# Patient Record
Sex: Female | Born: 1985 | Race: White | Hispanic: No | Marital: Single | Smoking: Former smoker
Health system: Southern US, Community
[De-identification: ages and names within clinical notes are randomized; demographics above are authoritative.]

## PROBLEM LIST (undated history)

## (undated) DIAGNOSIS — R319 Hematuria, unspecified: Secondary | ICD-10-CM

## (undated) DIAGNOSIS — R1032 Left lower quadrant pain: Secondary | ICD-10-CM

## (undated) DIAGNOSIS — R51 Headache: Secondary | ICD-10-CM

## (undated) DIAGNOSIS — K219 Gastro-esophageal reflux disease without esophagitis: Secondary | ICD-10-CM

## (undated) DIAGNOSIS — G8929 Other chronic pain: Secondary | ICD-10-CM

## (undated) DIAGNOSIS — R519 Headache, unspecified: Secondary | ICD-10-CM

## (undated) DIAGNOSIS — N898 Other specified noninflammatory disorders of vagina: Secondary | ICD-10-CM

## (undated) DIAGNOSIS — R300 Dysuria: Principal | ICD-10-CM

## (undated) DIAGNOSIS — M79606 Pain in leg, unspecified: Secondary | ICD-10-CM

## (undated) DIAGNOSIS — F1290 Cannabis use, unspecified, uncomplicated: Secondary | ICD-10-CM

## (undated) HISTORY — DX: Other specified noninflammatory disorders of vagina: N89.8

## (undated) HISTORY — DX: Dysuria: R30.0

## (undated) HISTORY — PX: FACIAL COSMETIC SURGERY: SHX629

## (undated) HISTORY — DX: Hematuria, unspecified: R31.9

## (undated) HISTORY — DX: Left lower quadrant pain: R10.32

---
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
## 2006-01-18 ENCOUNTER — Emergency Department (HOSPITAL_COMMUNITY): Admission: EM | Admit: 2006-01-18 | Discharge: 2006-01-18 | Payer: Self-pay | Admitting: Emergency Medicine

## 2006-03-18 ENCOUNTER — Emergency Department (HOSPITAL_COMMUNITY): Admission: EM | Admit: 2006-03-18 | Discharge: 2006-03-18 | Payer: Self-pay | Admitting: Emergency Medicine

## 2006-03-24 ENCOUNTER — Ambulatory Visit (HOSPITAL_COMMUNITY): Admission: RE | Admit: 2006-03-24 | Discharge: 2006-03-24 | Payer: Self-pay | Admitting: Otolaryngology

## 2006-04-11 ENCOUNTER — Emergency Department (HOSPITAL_COMMUNITY): Admission: EM | Admit: 2006-04-11 | Discharge: 2006-04-12 | Payer: Self-pay | Admitting: Emergency Medicine

## 2006-05-10 ENCOUNTER — Emergency Department (HOSPITAL_COMMUNITY): Admission: EM | Admit: 2006-05-10 | Discharge: 2006-05-10 | Payer: Self-pay | Admitting: Emergency Medicine

## 2006-05-20 ENCOUNTER — Emergency Department (HOSPITAL_COMMUNITY): Admission: EM | Admit: 2006-05-20 | Discharge: 2006-05-20 | Payer: Self-pay | Admitting: Emergency Medicine

## 2006-11-21 ENCOUNTER — Other Ambulatory Visit: Admission: RE | Admit: 2006-11-21 | Discharge: 2006-11-21 | Payer: Self-pay | Admitting: Obstetrics and Gynecology

## 2007-01-15 ENCOUNTER — Emergency Department (HOSPITAL_COMMUNITY): Admission: EM | Admit: 2007-01-15 | Discharge: 2007-01-15 | Payer: Self-pay | Admitting: Emergency Medicine

## 2007-01-27 ENCOUNTER — Inpatient Hospital Stay (HOSPITAL_COMMUNITY): Admission: AD | Admit: 2007-01-27 | Discharge: 2007-01-29 | Payer: Self-pay | Admitting: Obstetrics and Gynecology

## 2007-01-27 ENCOUNTER — Ambulatory Visit: Payer: Self-pay | Admitting: Obstetrics and Gynecology

## 2007-01-27 ENCOUNTER — Other Ambulatory Visit: Payer: Self-pay | Admitting: Emergency Medicine

## 2007-01-28 ENCOUNTER — Encounter: Payer: Self-pay | Admitting: Obstetrics and Gynecology

## 2007-04-10 ENCOUNTER — Emergency Department (HOSPITAL_COMMUNITY): Admission: EM | Admit: 2007-04-10 | Discharge: 2007-04-11 | Payer: Self-pay | Admitting: Emergency Medicine

## 2007-07-22 ENCOUNTER — Emergency Department (HOSPITAL_COMMUNITY): Admission: EM | Admit: 2007-07-22 | Discharge: 2007-07-22 | Payer: Self-pay | Admitting: Emergency Medicine

## 2008-03-07 ENCOUNTER — Emergency Department (HOSPITAL_COMMUNITY): Admission: EM | Admit: 2008-03-07 | Discharge: 2008-03-07 | Payer: Self-pay | Admitting: Emergency Medicine

## 2009-03-02 ENCOUNTER — Emergency Department (HOSPITAL_COMMUNITY): Admission: EM | Admit: 2009-03-02 | Discharge: 2009-03-02 | Payer: Self-pay | Admitting: Emergency Medicine

## 2009-05-19 ENCOUNTER — Emergency Department (HOSPITAL_COMMUNITY): Admission: EM | Admit: 2009-05-19 | Discharge: 2009-05-19 | Payer: Self-pay | Admitting: Emergency Medicine

## 2009-11-16 ENCOUNTER — Ambulatory Visit: Payer: Self-pay | Admitting: Obstetrics & Gynecology

## 2009-11-16 ENCOUNTER — Inpatient Hospital Stay (HOSPITAL_COMMUNITY): Admission: AD | Admit: 2009-11-16 | Discharge: 2009-11-17 | Payer: Self-pay | Admitting: Obstetrics & Gynecology

## 2009-11-27 ENCOUNTER — Inpatient Hospital Stay (HOSPITAL_COMMUNITY): Admission: AD | Admit: 2009-11-27 | Discharge: 2009-11-29 | Payer: Self-pay | Admitting: Obstetrics and Gynecology

## 2009-11-27 ENCOUNTER — Ambulatory Visit: Payer: Self-pay | Admitting: Obstetrics & Gynecology

## 2010-03-16 LAB — CBC
HCT: 23.6 % — ABNORMAL LOW (ref 36.0–46.0)
HCT: 31.4 % — ABNORMAL LOW (ref 36.0–46.0)
Hemoglobin: 10.9 g/dL — ABNORMAL LOW (ref 12.0–15.0)
Hemoglobin: 8.2 g/dL — ABNORMAL LOW (ref 12.0–15.0)
MCH: 33 pg (ref 26.0–34.0)
MCH: 33 pg (ref 26.0–34.0)
MCHC: 34.7 g/dL (ref 30.0–36.0)
MCHC: 34.7 g/dL (ref 30.0–36.0)
MCV: 95.2 fL (ref 78.0–100.0)
MCV: 95.3 fL (ref 78.0–100.0)
Platelets: 130 10*3/uL — ABNORMAL LOW (ref 150–400)
Platelets: 150 10*3/uL (ref 150–400)
RBC: 2.48 MIL/uL — ABNORMAL LOW (ref 3.87–5.11)
RBC: 3.3 MIL/uL — ABNORMAL LOW (ref 3.87–5.11)
RDW: 13.9 % (ref 11.5–15.5)
RDW: 14.1 % (ref 11.5–15.5)
WBC: 15.9 10*3/uL — ABNORMAL HIGH (ref 4.0–10.5)
WBC: 18.4 10*3/uL — ABNORMAL HIGH (ref 4.0–10.5)

## 2010-03-16 LAB — URINALYSIS, ROUTINE W REFLEX MICROSCOPIC
Bilirubin Urine: NEGATIVE
Glucose, UA: NEGATIVE mg/dL
Hgb urine dipstick: NEGATIVE
Ketones, ur: NEGATIVE mg/dL
Nitrite: NEGATIVE
Protein, ur: NEGATIVE mg/dL
Specific Gravity, Urine: 1.01 (ref 1.005–1.030)
Urobilinogen, UA: 0.2 mg/dL (ref 0.0–1.0)
pH: 6 (ref 5.0–8.0)

## 2010-03-16 LAB — URINE MICROSCOPIC-ADD ON

## 2010-03-16 LAB — STREP B DNA PROBE: Strep Group B Ag: NEGATIVE

## 2010-03-16 LAB — WET PREP, GENITAL
Trich, Wet Prep: NONE SEEN
Yeast Wet Prep HPF POC: NONE SEEN

## 2010-03-16 LAB — RPR: RPR Ser Ql: NONREACTIVE

## 2010-03-16 LAB — GC/CHLAMYDIA PROBE AMP, GENITAL
Chlamydia, DNA Probe: NEGATIVE
GC Probe Amp, Genital: NEGATIVE

## 2010-03-22 LAB — URINE CULTURE: Colony Count: 15000

## 2010-03-22 LAB — URINALYSIS, ROUTINE W REFLEX MICROSCOPIC
Bilirubin Urine: NEGATIVE
Glucose, UA: NEGATIVE mg/dL
Hgb urine dipstick: NEGATIVE
Ketones, ur: NEGATIVE mg/dL
Nitrite: NEGATIVE
Protein, ur: NEGATIVE mg/dL
Specific Gravity, Urine: 1.015 (ref 1.005–1.030)
Urobilinogen, UA: 0.2 mg/dL (ref 0.0–1.0)
pH: 7.5 (ref 5.0–8.0)

## 2010-03-22 LAB — URINE MICROSCOPIC-ADD ON

## 2010-03-22 LAB — POCT PREGNANCY, URINE: Preg Test, Ur: POSITIVE

## 2010-03-24 LAB — STREP A DNA PROBE: Group A Strep Probe: NEGATIVE

## 2010-03-24 LAB — PREGNANCY, URINE: Preg Test, Ur: NEGATIVE

## 2010-03-24 LAB — RAPID STREP SCREEN (MED CTR MEBANE ONLY): Streptococcus, Group A Screen (Direct): NEGATIVE

## 2010-04-15 LAB — URINE MICROSCOPIC-ADD ON

## 2010-04-15 LAB — WET PREP, GENITAL
Trich, Wet Prep: NONE SEEN
Yeast Wet Prep HPF POC: NONE SEEN

## 2010-04-15 LAB — GC/CHLAMYDIA PROBE AMP, GENITAL: GC Probe Amp, Genital: NEGATIVE

## 2010-04-15 LAB — URINALYSIS, ROUTINE W REFLEX MICROSCOPIC
Bilirubin Urine: NEGATIVE
Glucose, UA: 250 mg/dL — AB
Ketones, ur: 15 mg/dL — AB
Specific Gravity, Urine: 1.025 (ref 1.005–1.030)
pH: 5 (ref 5.0–8.0)

## 2010-04-15 LAB — GLUCOSE, CAPILLARY: Glucose-Capillary: 92 mg/dL (ref 70–99)

## 2010-04-15 LAB — PREGNANCY, URINE: Preg Test, Ur: NEGATIVE

## 2010-05-18 NOTE — Discharge Summary (Signed)
[name] Taylor, [name] Taylor                 ACCOUNT NO.:  [id number]   MEDICAL RECORD NO.:  [id number]          PATIENT TYPE:  INP   LOCATION:  9306                          FACILITY:  [facility]   PHYSICIAN:  Phil D. Okey Dupre, M.D.     DATE OF BIRTH:  1985/03/25   DATE OF ADMISSION:  01/27/2007  DATE OF DISCHARGE:                               DISCHARGE SUMMARY   ADMITTING DIAGNOSES:  1. Pregnancy 23 weeks 4 days.  2. Preterm prolonged ruptured membranes.  3. Labor.  4. Urinary tract infection.  5. Fever.   DISCHARGE DIAGNOSES:  1. Pregnancy 20 weeks.  2. Preterm labor.  3. Preterm prolonged ruptured membranes.  4. Urinary tract infection.  5. Probable chorioamnionitis.   DISCHARGE MEDICATIONS:  1. Motrin 600 mg, 30 tablets, one q.6h. p.r.n. cramping.  2. Chromagen Forte, 60 tablets, one p.o. b.i.d. x30 days.  3. Depo-Provera 150 mg IM administered before discharge, will repeat      q.12 weeks as outpatient.   FOLLOWUP:  Two weeks [facility].   HOSPITAL SUMMARY:  This 25 year old female gravida 2, para 1, AB 0, now  20 weeks and 25 years old, was admitted to [facility]  after EMS transfer from [facility] on the evening of January 27, 2007.  The patient had a 2 day history of clear nonodorous fluid  running down her legs several times at day which she attributed to being  urinary incontinence.  At that time she was being treated for a  suspected UTI due to positive urinalysis 2 weeks earlier as an  outpatient.  She was having low-grade fever off and on throughout  January 27, 2007, before presenting to [facility].  While at  [facility] she began to have intermittent lower abdominal pain.  She was  transferred via EMS and upon arrival to the MAU, within 30 minutes of  arrival, she complained of feeling something coming out.  The exam  showed indeed that she was delivering.  In the MAU she delivered a tiny  female at 2145 hours, with placenta  delivered without difficultly, intact  shortly after the baby was delivered.  Cord blood pH was 7.23.   Prenatal course is documented in the prenatal record with blood type O  positive, antibody screen negative.  Initial ultrasound had shown no  problems, performed at 21 weeks.   HOSPITAL COURSE:  After delivery in the MAU, the patient was transferred  to postpartum floor.  She had a dramatically elevated initial white  count of 34,700, hemoglobin 8.2, hematocrit 32.8.  she received IV  antibiotics Rocephin x24 hours.  She had dramatic improvement in her  white count to 18,400.  She remained completely afebrile during her  postpartum stay.  She was considered stable for discharge on postpartum  day 2.  She has followup visit in 2 weeks.   SOCIAL HISTORY:  The patient's partner's family is from [place] and  so patient will be staying in [place] with them during the baby's  prolonged ICU stay.   The baby weighed  1 pound 5 ounces and at time of maternal discharge the  baby is on 27% O2 with 7 of PEEP, considered stable given its extreme  prematurity.      Tilda Burrow, M.D.      Phil D. Okey Dupre, M.D.  Electronically Signed    JVF/MEDQ  D:  01/29/2007  T:  01/29/2007  Job:  [id number]

## 2010-05-21 NOTE — H&P (Signed)
[name], Taylor                           ACCOUNT NO.:  [id number]   MEDICAL RECORD NO.:  [id number]                   PATIENT TYPE:  INP   LOCATION:  0103                                 [facility]ACILITY:  [facility]   PHYSICIAN:  Beverly Milch, MD                  DATE OF BIRTH:  07-18-1985   DATE OF ADMISSION:  07/15/2003  DATE OF DISCHARGE:                         PSYCHIATRIC ADMISSION ASSESSMENT   IDENTIFICATION:  A 80-23/25-year-old female who will repeat the 9th grade this  fall at [employer], is admitted emergently, voluntarily on  referral from [name] Taylor at [facility] crisis for  inpatient stabilization of suicide risk and depression.  The patient has  been in outpatient substance abuse treatment program at [facility] and in the course of treatment her substance abuse is ceasing  but she is now manifesting more depression and acute suicidality, likely  previously covered up by her substance abuse.  She is admitted with major  depression for pharmacotherapeutic and psychotherapeutic stabilization in a  secure environment until safe again.   HISTORY OF PRESENT ILLNESS:  The patient has been in therapy more primarily  with [name] Taylor at [facility] weekly for the last year.  The patient indicates that she has some significant substance abuse,  primarily with cannabis, though possibly with other agents as well.  The  patient is on probation for possession of cannabis and apparently has been  kicked out of school in this way.  The patient has had significant loss with  brother dying in an auto accident several years ago, with grief possibly not  resolved.  The patient may delay grieving by her substance abuse for at  least the last year, though likely longer.  The patient has acutely asked  mother for a gun to kill herself in the last few days.  She has seemed  progressively hopeless and helpless.  She has been  unable to sleep,  sometimes going for 2 nights in a row without sleeping, despite being very  tired in the day.  She has difficulty initiating and maintaining sleep.  She  has not had any hypomanic or psychotic symptoms.  The patient is not open  about her feelings and states she just stores them up and never resolves  them.  She does not acknowledge primary anxiety, including no post-traumatic  stress.  She has been sexually promiscuous in addition to her substance use.  Her attitude is generally self defeating and negative and she has automatic  cognitive negativities in which she is somewhat fixated.  The patient  indicates this is a self sustaining cycle.  Mother was very depressed after  the death of the patient's brother and mother has apparently had some  substance abuse problems herself.  The patient lives with mother and  grandmother is supportive.  The patient is losing weight as well as  not  eating, apparently due to the depression.  Parents have been separated for 6  years, with father residing in [place].  The patient has a sister,  [name] Taylor, who resides with father, age 32, and brother [name] Taylor died in 05/31/00  at age 87.   PAST MEDICAL HISTORY:  The patient has a history of asthma in early  childhood.  Her last menses was June of 2005 and she is sexually active.  She has multiple hickey-type contusions on the anterior neck which she  states are not painful or injurious.  She is on no medications and has no  medication allergies.  She is otherwise in good general health, although she  appears somewhat thin.  She has no history of seizure or syncope.  She has  no heart murmur or arrythmia.   REVIEW OF SYSTEMS:  The patient denies any difficulty with gait, gaze or  continence.  She denies exposure to communicable disease or toxins.  She  denies rash, jaundice or purpura.  There is no chest pain, palpitations, or  presyncope.  There is no abdominal pain, nausea, vomiting or  diarrhea.  There is no dysuria or arthralgia.  Immunizations are up to date.   FAMILY HISTORY:  Mother has depression following the death of the patient's  brother in 05/31/00 at age 35, apparently in a car wreck.  The patient's father  apparently lives in [place] and parents have been separated for 6  years.  The patient's sister, [name] Taylor, resides with father and the patient  and sister are jealous of each other.  Mother apparently has had substance  abuse in the remote past though mother does not acknowledge such.   SOCIAL AND DEVELOPMENTAL HISTORY:  There were no definite complications or  consequences of gestation, delivery, or neonatal period known.  The patient  completed 8th grade but is now having to repeat the 9th grade in [employer].  The patient is sexually active.  She has been on legal  probation for cannabis possession.   ASSETS:  The patient is trying to be honest and open up more.   MENTAL STATUS EXAM:  Height is 59.24 inches and weight is 101 pounds by  nursing measurement.  Blood pressure 88/52 with heart rate of 61 sitting and  98/60 with heart rate of 72 standing.  The patient is right handed.  She  will cooperative with the neurological exam only to a limited extent, as  though she is quite shy.  She notes she has been afraid of the dark at times  in the past.  She does not present with significant anxiety.  Cranial nerves  II-XII intact.  Deep tendon reflexes and AMRs are 0/0.  Muscle strength and  tone are normal.  Speech is normal and she is alert and oriented x3.  There  are no pathologic reflexes or soft neurologic findings.  There are no  abnormal involuntary movements.  Gait and gaze are intact.  She is hopeless  and helpless in her melancholic style.  She does have some avoidance and  inhibition which however is paradoxical considering her defiant substance  abuse, including legal consequences.  The patient does have unresolved grief for  her brother, as may mother.  The patient does not present post-traumatic  stress or fully established generalized anxiety.  She does have severe  dysphoria with melancholic features.  She has suicidal ideation with a plan  to kill herself with a gun if mother  had one.  She suggests that her  substance abuse is 75% improved and suggests that she is now sober.  Urine  drug screen is pending.  She is not assaultive or homicidal.   IMPRESSION:  AXIS 1:  1. Major depression, single episode, severe, with atypical and melancholic     features.  2. Cannabis abuse.  3. Rule out anxiety disorder not otherwise specified with avoidant features.  4. Other specified family circumstances.  5. Parent-child problem.  AXIS II:  Diagnosis deferred.  AXIS III:  1. History of asthma.  2. Multiple hickey contusions on the anterior neck.  3. Sexually active.  AXIS IV:  Stressors:  Family - severe, acute and chronic; school - severe, acute and  chronic; legal - moderate to chronic; phase of life, severe, acute.  AXIS V:  Global assessment of function on admission 36 with highest in last year 68.   PLAN:  The patient is admitted for inpatient adolescent psychiatric and  multi-disciplinary, multi-modal behavioral health treatment in a team-based  program in a locked psychiatric unit.  Wellbutrin appears to be the most  potentially efficacious antidepressant at this time.  The patient is  referred to start antidepressant therapy and this is discussed with mother  by phone as well as mother declines to come to the hospital unit for the  intake today, but requires that it be done by phone.  P.r.n. trazodone is  also processed with mother, reviewing the FDA guidelines, the indications,  the side effects, risk, and proper use.  Cognitive behavioral therapy,  substance abuse intervention, grieving for brother, identity consolidation,  family therapy and parent management training are addressed and treatment   structure.  Estimated length of stay is 6-8 days with target symptoms for  discharge being stabilization of suicide risk and mood, stabilization of  avoidant and substance abuse, restoration of family communication and  containment, and generalization of the capacity for safe and effective  participation in outpatient treatment.                                               Beverly Milch, MD    GJ/MEDQ  D:  07/16/2003  T:  07/17/2003  Job:  [id number]

## 2010-05-21 NOTE — Discharge Summary (Signed)
[name], [name] Taylor                 ACCOUNT NO.:  [id number]   MEDICAL RECORD NO.:  [id number]          PATIENT TYPE:  OBV   LOCATION:  A428                          FACILITY:  [facility]   PHYSICIAN:  Tilda Burrow, M.D. DATE OF BIRTH:  May 29, 1985   DATE OF ADMISSION:  12/28/2003  DATE OF DISCHARGE:  12/26/2005LH                                 DISCHARGE SUMMARY   HOSPITAL SUMMARY:  This 25 year old primiparous patient was admitted after a  second ER visit within 24 hours, complaining of right lower quadrant pain.  She has a known right ovarian cyst and pregnancy is approximately 16 weeks  by ultrasound criteria. She returned to the emergency room this evening, and  this was observed overnight. She received IV fluid hydration through the  night, rested comfortably, denies pain to palpation this a.m., and is  discharged home on Tylenol #3 from recurrences of discomfort with signs and  symptoms of deterioration of ovarian cyst or ovarian torsion reviewed with  patient. Otherwise, she will be seen as previously scheduled on January 02, 2004 at our office.     John   JVF/MEDQ  D:  12/29/2003  T:  12/29/2003  Job:  [id number]
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
## 2010-05-21 NOTE — H&P (Signed)
[name] Taylor, [name]                 ACCOUNT NO.:  [id number]   MEDICAL RECORD NO.:  [id number]          PATIENT TYPE:  INP   LOCATION:  LDR1                          FACILITY:  [facility]   PHYSICIAN:  Tilda Burrow, M.D. DATE OF BIRTH:  May 31, 1985   DATE OF ADMISSION:  07/09/2004  DATE OF DISCHARGE:  LH                                HISTORY & PHYSICAL   ADMITTING DIAGNOSES:  1.  Pregnancy 40 weeks 1 day.  2.  Pregnancy induced hypertension.   HISTORY OF PRESENT ILLNESS:  This 25 year old primigravida female at 40  weeks 1 day is admitted after a pregnancy course followed through our office  and notable for blood pressure elevation, first appreciated 07/07/2004 with  blood pressure 130/90, negative protein with the patient brought back in 2  days later.  The patient is 2 cm, 50%, minus 2.  Today's evaluation shows  blood pressure as being increased to 150/90, trace protein with 3+ reflexes.  Ultrasound shows a singleton fetus, vertex presentation, grade 3 placenta.  The cervix is 2 cm.   Plans are for admission for induction of labor.  The patient will be  presented to Dr. Langley Gauss covering physician for management.   PAST MEDICAL HISTORY:  Positive for depression.   GYNECOLOGICAL HISTORY:  Positive for condyloma.   SURGICAL HISTORY:  Negative.   ALLERGIES:  WELLBUTRIN made patient nauseated and she fainted in the past.   MEDICATIONS:  Prozac taken early in pregnancy, recently quit.  The patient  would be at risk for POSTPARTUM DEPRESSION.   PHYSICAL EXAMINATION:  Shows a generally healthy appearing Caucasian female.  Vital signs shows a weight 138 which is a 2 pound weight loss from 2 days  ago.  Blood pressure 150/90, Additionally the patient is noted to  complain of pain in the right upper quadrant and midback.  Reflexes are 3+  and brisk.  Estimated fetal weight 7 pounds 3 ounces.  Amniotic fluid index  10.4 with a grade 3 placenta.   PLAN:  Admit for induction of  labor due to pregnancy induced hypertension.       JVF/MEDQ  D:  07/09/2004  T:  07/09/2004  Job:  [id number]   cc:   Tilda Burrow, M.D.  Fax: [phone]   Langley Gauss, MD  [address], [address]  [place]  [place] [address]  Fax: [phone]   Francoise Schaumann. Halford Chessman  Fax: [phone]

## 2010-05-21 NOTE — Op Note (Signed)
[name] Taylor, [name] Taylor                 ACCOUNT NO.:  [id number]   MEDICAL RECORD NO.:  [id number]          PATIENT TYPE:  INP   LOCATION:  A402                          FACILITY:  [facility]   PHYSICIAN:  Langley Gauss, MD     DATE OF BIRTH:  1985/01/09   DATE OF PROCEDURE:  07/10/2004  DATE OF DISCHARGE:                                 OPERATIVE REPORT   DELIVERY NOTE:   DIAGNOSES:  A 40-week intrauterine pregnancy for induction of labor.   DELIVERY PERFORMED:  1.  A spontaneous assisted vaginal delivery, 6 pounds 4 ounces female      infant.  2.  A midline episiotomy repair.   Delivery performed by Dr. Roylene Reason. Lisette Grinder.   ANALGESIA:  The patient received a single dose of IV narcotics during the  course of labor. At time of delivery total of 30 cc 1% lidocaine plain  injected in the midline of the perineal body   SUMMARY:  The patient received prenatal care through [facility]. She  was seen in our office June 09, 2004 at which time decision was made to  proceed with induction of labor secondary to some findings of elevated blood  pressures. Decision for induction made by Dr. Christin Bach. See his history  and physical for details regarding this. Patient subsequently was referred  [facility]. She had an ultrasound done in our office with estimated  fetal weight of 7-1/2 pounds. Cervical exam 1 week previously had been 2 cm.  The patient noted be 40-weeks gestation. Labor delivery blood pressures at  110s  to 130s over 70s to 80s. Trace protein.   Laboratory studies include a CBC and liver function tests within normal  limits.   When I examined patient, she is noted to be 3 cm dilated, 90% effaced,  vertex at zero station. Amniotomy was performed with findings of clear  amniotic fluid, and a fetal scalp electrode was placed. Subsequently the  patient was noted to spontaneously enter the active phase of labor. She had  planned on receiving an epidural for pain  purposes. However, she progressed  very rapidly through the labor course to complete dilatation at which time I  was notified. The patient was placed in a dorsolithotomy position, prepped  and draped in usual sterile manner with descent of the vertex and distension  of the perineum. Midline episiotomy was performed. Infant delivered a direct  OA position over this midline episiotomy without extension. Mouth and nares  bulb suctioned of clear amniotic fluid. Spontaneous rotation occurred to a  left anterior shoulder position. Gentle downward traction combined with  expulsive efforts resulted in delivery of this anterior shoulder beneath the  pubis synthesis without difficulty. The remainder of the infant also  delivered out difficulty. Umbilical cord is milked toward the infant. Cord  was doubly clamped and cut. Infant was placed on maternal abdomen for  immediate bonding purposes. The patient does plan on bottle-feeding.  Arterial cord gas and cord blood were then obtained. Gentle traction on  umbilical cord results in separation which upon examination is  noted be  intact placenta, associated three-vessel umbilical cord. Examination of the  genital tract reveals no lacerations. Midline episiotomy was easily repaired  utilizing 0 chromic in a running  locked fashion on the vaginal mucosa followed by a two-layer closure of 0  chromic on the perineal body. The patient tolerated the labor and delivery  very well. She was taken out the dorsolithotomy position. Both mother and  infant doing well following delivery       DC/MEDQ  D:  07/10/2004  T:  07/10/2004  Job:  [id number]

## 2010-05-21 NOTE — Consult Note (Signed)
NAMEGENEVIENE, [name] Taylor                 ACCOUNT NO.:  [id number]   MEDICAL RECORD NO.:  [id number]          PATIENT TYPE:  OBV   LOCATION:  A428                          FACILITY:  [facility]   PHYSICIAN:  Tilda Burrow, M.D. DATE OF BIRTH:  May 26, 1985   DATE OF CONSULTATION:  DATE OF DISCHARGE:                                   CONSULTATION   ADMITTING DIAGNOSES:  1.  Pregnancy, 16 weeks.  2.  Symptomatic right ovary and cyst (corpus luteum).  3.  History of recent chlamydia therapy.  4.  Nausea and vomiting in pregnancy.   HISTORY OF PRESENT ILLNESS:  This is a 25 year old, gravida 1, para 0, EDC  June 08, 2004 by ultrasound, uncertain LMP, who was seen twice in [facility]  Emergency Room on December 28, 2003 for complaints of right lower quadrant.  She presented at 5:00 a.m. via EMS with complaints of right lower quadrant  pain.  She gave a history of having been seen for pregnancy care twice in  [facility] with ultrasound diagnosis of pregnancy gestation with a  normal single intrauterine pregnancy diagnosed with a right ovarian cyst  noted and noted intermittently to be symptomatic.  Follow-up is scheduled  for January 02, 2004.  She was also diagnosed has having chlamydia  cervicitis, previously treated through the office, as was her partner.  She  had not resumed sexual activity after treatment.  She presents after sudden  onset of right lower quadrant pain approximately 3:00 a.m. with no fever,  chills.  She does have nausea, persistent through the pregnancy, which is  not significantly worse.  The pain is worse when she is upright, coughing or  with abdominal pressure in the right lower quadrant.   PAST MEDICAL HISTORY:  Benign.   PAST SURGICAL HISTORY:  Negative.   SOCIAL HISTORY:  Single; lives with mother; high school dropout, ninth  grade; allegedly slowly seeking her GED; boyfriend is still involved in the  relationship.   PHYSICAL EXAMINATION:  GENERAL:  Mildly  dehydrated Caucasian female and time  of exam is 10:40 p.m.  Alert, oriented times three, somewhat nonverbal.  She  relies on her mother for most answers.  Pulse normal.  She is well hydrated.  HEENT:  There are dry mucous membranes.  ABDOMEN:  Soft, nondistended.  Patient complains of pain over the anterior-  superior iliac crest in a line toward the groin.  There is no adenopathy.  There is mild right lower quadrant discomfort with no guarding and no  rebound tenderness.   LABORATORY DATA:  Includes urinalysis with specific gravity of 1.025, trace  ketones, negative for nitrates, esterase and protein.  Electrolytes  generally showed potassium 3.2, BUN 6, creatinine 0.7.  White count 11,400,  hemoglobin 11, hematocrit 32.  These are from 5:00 a.m. on the day of  admission.   IMPRESSION:  This patient is tolerating the discomfort of the symptomatic  ovarian cyst quite poorly.  I do not see evidence of acute abdomen.  Patient  is comfortable at this time as she is  lying supine.  Pain is not a big  problem, except when upright or physically active.   PLAN:  Overnight observation and hydration.  We will re-assess the abdomen  in a.m. and compare notes.  Obtain office notes for confirmation of  pregnancy history.  Probable brief hospitalization and outpatient analgesics  shortly.     John   JVF/MEDQ  D:  12/28/2003  T:  12/29/2003  Job:  [id number]   cc:   Tilda Burrow, M.D.  [address] [place]  [place] [address]  Fax: [phone]
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
## 2010-05-27 ENCOUNTER — Emergency Department (HOSPITAL_COMMUNITY)
Admission: EM | Admit: 2010-05-27 | Discharge: 2010-05-27 | Disposition: A | Discharge status: Home / Self Care | Payer: Self-pay | Attending: Emergency Medicine | Admitting: Emergency Medicine

## 2010-05-27 DIAGNOSIS — M7989 Other specified soft tissue disorders: Secondary | ICD-10-CM | POA: Insufficient documentation

## 2010-05-27 DIAGNOSIS — M79609 Pain in unspecified limb: Secondary | ICD-10-CM | POA: Insufficient documentation

## 2010-05-27 LAB — POCT I-STAT, CHEM 8
BUN: 17 mg/dL (ref 6–23)
Calcium, Ion: 1.15 mmol/L (ref 1.12–1.32)
Chloride: 108 mEq/L (ref 96–112)
Glucose, Bld: 92 mg/dL (ref 70–99)
HCT: 37 % (ref 36.0–46.0)
Potassium: 3.9 mEq/L (ref 3.5–5.1)

## 2010-05-28 ENCOUNTER — Ambulatory Visit (HOSPITAL_COMMUNITY)
Admit: 2010-05-28 | Discharge: 2010-05-28 | Disposition: A | Discharge status: Home / Self Care | Payer: Self-pay | Source: Ambulatory Visit | Attending: Emergency Medicine | Admitting: Emergency Medicine

## 2010-05-28 ENCOUNTER — Other Ambulatory Visit (HOSPITAL_COMMUNITY): Payer: Self-pay

## 2010-05-28 DIAGNOSIS — M79609 Pain in unspecified limb: Secondary | ICD-10-CM | POA: Insufficient documentation

## 2010-09-22 LAB — URINALYSIS, ROUTINE W REFLEX MICROSCOPIC
Bilirubin Urine: NEGATIVE
Glucose, UA: NEGATIVE
Ketones, ur: NEGATIVE
Nitrite: NEGATIVE
Specific Gravity, Urine: 1.02
pH: 7.5

## 2010-09-22 LAB — URINE MICROSCOPIC-ADD ON

## 2010-09-23 LAB — BASIC METABOLIC PANEL
BUN: 4 — ABNORMAL LOW
CO2: 22
Chloride: 106
Glucose, Bld: 85
Potassium: 3.3 — ABNORMAL LOW
Sodium: 133 — ABNORMAL LOW

## 2010-09-23 LAB — URINE CULTURE: Colony Count: 60000

## 2010-09-23 LAB — DIFFERENTIAL
Band Neutrophils: 15 — ABNORMAL HIGH
Blasts: 0
Metamyelocytes Relative: 0
Monocytes Absolute: 0.4
Monocytes Relative: 1 — ABNORMAL LOW
Myelocytes: 0
Promyelocytes Absolute: 0

## 2010-09-23 LAB — CULTURE, BLOOD (ROUTINE X 2): Culture: NO GROWTH

## 2010-09-23 LAB — URINALYSIS, ROUTINE W REFLEX MICROSCOPIC
Ketones, ur: 40 — AB
Nitrite: NEGATIVE
Urobilinogen, UA: 0.2

## 2010-09-23 LAB — CBC
HCT: 27.2 — ABNORMAL LOW
Hemoglobin: 9.4 — ABNORMAL LOW
MCHC: 34.4
MCV: 91.3
RDW: 13.4

## 2010-09-24 LAB — CBC
HCT: 24 — ABNORMAL LOW
Hemoglobin: 8.4 — ABNORMAL LOW
MCHC: 35.9
MCV: 91.6
Platelets: 194
Platelets: 202
WBC: 18.4 — ABNORMAL HIGH
WBC: 33.3 — ABNORMAL HIGH
WBC: 34.7 — ABNORMAL HIGH

## 2010-09-28 LAB — RAPID URINE DRUG SCREEN, HOSP PERFORMED
Amphetamines: NOT DETECTED
Cocaine: NOT DETECTED
Opiates: NOT DETECTED
Tetrahydrocannabinol: POSITIVE — AB

## 2010-09-28 LAB — URINALYSIS, ROUTINE W REFLEX MICROSCOPIC
Bilirubin Urine: NEGATIVE
Glucose, UA: NEGATIVE
Hgb urine dipstick: NEGATIVE
Specific Gravity, Urine: 1.025
pH: 6

## 2010-09-28 LAB — WET PREP, GENITAL: Trich, Wet Prep: NONE SEEN

## 2010-10-01 LAB — GC/CHLAMYDIA PROBE AMP, GENITAL
Chlamydia, DNA Probe: POSITIVE — AB
GC Probe Amp, Genital: POSITIVE — AB

## 2010-10-01 LAB — HEMOGLOBIN AND HEMATOCRIT, BLOOD
HCT: 36.8
Hemoglobin: 12.8

## 2010-11-16 ENCOUNTER — Emergency Department (HOSPITAL_COMMUNITY)
Admission: EM | Admit: 2010-11-16 | Discharge: 2010-11-16 | Disposition: A | Discharge status: Home / Self Care | Payer: Self-pay | Attending: Emergency Medicine | Admitting: Emergency Medicine

## 2010-11-16 ENCOUNTER — Encounter: Payer: Self-pay | Admitting: *Deleted

## 2010-11-16 DIAGNOSIS — F17200 Nicotine dependence, unspecified, uncomplicated: Secondary | ICD-10-CM | POA: Insufficient documentation

## 2010-11-16 DIAGNOSIS — K0889 Other specified disorders of teeth and supporting structures: Secondary | ICD-10-CM

## 2010-11-16 DIAGNOSIS — K089 Disorder of teeth and supporting structures, unspecified: Secondary | ICD-10-CM | POA: Insufficient documentation

## 2010-11-16 DIAGNOSIS — R220 Localized swelling, mass and lump, head: Secondary | ICD-10-CM | POA: Insufficient documentation

## 2010-11-16 MED ORDER — PENICILLIN V POTASSIUM 500 MG PO TABS: 500.0000 mg | ORAL_TABLET | Freq: Four times a day (QID) | ORAL | Status: AC

## 2010-11-16 MED ORDER — HYDROCODONE-ACETAMINOPHEN 5-325 MG PO TABS: ORAL_TABLET | ORAL | Status: AC

## 2010-11-16 NOTE — ED Notes (Signed)
Pt states tooth started hurting last night and awoke this morning to swelling in her lower left jaw. Pt was sent home from work secondary to facial swelling.

## 2010-11-16 NOTE — ED Provider Notes (Signed)
History     CSN: [id number] Arrival date & time: 11/16/2010  1:49 PM   First MD Initiated Contact with Patient 11/16/10 1347      Chief Complaint  Patient presents with  . Dental Pain    (Consider location/radiation/quality/duration/timing/severity/associated sxs/prior treatment) Patient is a 25 y.o. female presenting with tooth pain. The history is provided by the patient.  Dental PainThe primary symptoms include mouth pain. Primary symptoms do not include oral bleeding, oral lesions, headaches, fever, sore throat or angioedema. The symptoms began yesterday. The symptoms are worsening. The symptoms are new. The symptoms occur constantly.  Mouth pain began 24 -48 hours ago. Mouth pain occurs constantly. Mouth pain is worsening. Affected locations include: teeth and gum(s). The mouth pain is currently at 8/10.  Additional symptoms include: dental sensitivity to temperature, gum swelling, gum tenderness and facial swelling. Additional symptoms do not include: purulent gums, trismus, jaw pain, trouble swallowing, pain with swallowing, taste disturbance, ear pain and fatigue. Medical issues include: smoking and periodontal disease.    History reviewed. No pertinent past medical history.  History reviewed. No pertinent past surgical history.  History reviewed. No pertinent family history.  History  Substance Use Topics  . Smoking status: Current Everyday Smoker -- 1.0 packs/day    Types: Cigarettes  . Smokeless tobacco: Not on file  . Alcohol Use: No    OB History    Grav Para Term Preterm Abortions TAB SAB Ect Mult Living   3 3  1             Review of Systems  Constitutional: Negative for fever, chills and fatigue.  HENT: Positive for facial swelling and dental problem. Negative for ear pain, sore throat, trouble swallowing, neck pain and neck stiffness.   Gastrointestinal: Negative for nausea and vomiting.  Musculoskeletal: Negative for myalgias, back pain and arthralgias.    Skin: Negative for rash.  Neurological: Negative for dizziness, facial asymmetry, speech difficulty, weakness, numbness and headaches.  Hematological: Negative for adenopathy. Does not bruise/bleed easily.  All other systems reviewed and are negative.    Allergies  Review of patient's allergies indicates no known allergies.  Home Medications  No current outpatient prescriptions on file.  BP 124/61  Pulse 64  Temp(Src) 98.5 F (36.9 C) (Oral)  Resp 18  Ht 5\' 1"  (1.549 m)  Wt 125 lb (56.7 kg)  BMI 23.62 kg/m2  SpO2 100%  LMP 12/15/2009  Physical Exam  Nursing note and vitals reviewed. Constitutional: She is oriented to person, place, and time. She appears well-developed and well-nourished. No distress.  HENT:  Head: Normocephalic and atraumatic. No trismus in the jaw.  Mouth/Throat: Uvula is midline, oropharynx is clear and moist and mucous membranes are normal. No oral lesions. No dental abscesses or uvula swelling. No oropharyngeal exudate.  Neck: Normal range of motion. Neck supple.  Cardiovascular: Normal rate, regular rhythm and normal heart sounds.   Pulmonary/Chest: Effort normal and breath sounds normal. No respiratory distress. She exhibits no tenderness.  Musculoskeletal: Normal range of motion.  Lymphadenopathy:    She has no cervical adenopathy.  Neurological: She is alert and oriented to person, place, and time. No cranial nerve deficit. She exhibits normal muscle tone. Coordination normal.  Skin: Skin is warm and dry.  Psychiatric: She has a normal mood and affect.    ED Course  Procedures (including critical care time)      MDM    2:44 PM ttp of the left lower third molar and surrounding  gums.  No obvious periapical abscess.  Mild swelling of the left lower face.  No erythema.  No trismus.  I will prescribe PCN and pain medication.  Pt agrees to close f/u with a dentist.         Tranesha Lessner L. Trisha Mangle, [place] 11/16/10 1446

## 2010-11-16 NOTE — ED Notes (Signed)
Left lower tooth pain with noted swelling to lower lt jaw.

## 2010-11-18 NOTE — ED Provider Notes (Signed)
Medical screening examination/treatment/procedure(s) were performed by non-physician practitioner and as supervising physician I was immediately available for consultation/collaboration.  Nicoletta Dress. Colon Branch, MD 11/18/10 1057

## 2010-12-24 ENCOUNTER — Emergency Department (HOSPITAL_COMMUNITY)
Admission: EM | Admit: 2010-12-24 | Discharge: 2010-12-24 | Disposition: A | Discharge status: Home / Self Care | Payer: Self-pay | Attending: Emergency Medicine | Admitting: Emergency Medicine

## 2010-12-24 ENCOUNTER — Encounter (HOSPITAL_COMMUNITY): Payer: Self-pay | Admitting: Emergency Medicine

## 2010-12-24 ENCOUNTER — Emergency Department (HOSPITAL_COMMUNITY): Payer: Self-pay

## 2010-12-24 DIAGNOSIS — IMO0002 Reserved for concepts with insufficient information to code with codable children: Secondary | ICD-10-CM

## 2010-12-24 DIAGNOSIS — S93409A Sprain of unspecified ligament of unspecified ankle, initial encounter: Secondary | ICD-10-CM | POA: Insufficient documentation

## 2010-12-24 DIAGNOSIS — M25476 Effusion, unspecified foot: Secondary | ICD-10-CM | POA: Insufficient documentation

## 2010-12-24 DIAGNOSIS — M25579 Pain in unspecified ankle and joints of unspecified foot: Secondary | ICD-10-CM | POA: Insufficient documentation

## 2010-12-24 DIAGNOSIS — W010XXA Fall on same level from slipping, tripping and stumbling without subsequent striking against object, initial encounter: Secondary | ICD-10-CM | POA: Insufficient documentation

## 2010-12-24 DIAGNOSIS — F17200 Nicotine dependence, unspecified, uncomplicated: Secondary | ICD-10-CM | POA: Insufficient documentation

## 2010-12-24 DIAGNOSIS — Z79899 Other long term (current) drug therapy: Secondary | ICD-10-CM | POA: Insufficient documentation

## 2010-12-24 DIAGNOSIS — M25473 Effusion, unspecified ankle: Secondary | ICD-10-CM | POA: Insufficient documentation

## 2010-12-24 MED ORDER — ACETAMINOPHEN 500 MG PO TABS
1000.0000 mg | ORAL_TABLET | Freq: Once | ORAL | Status: AC
  Administered 2010-12-24: 1000 mg via ORAL
  Filled 2010-12-24: qty 2

## 2010-12-24 MED ORDER — IBUPROFEN 800 MG PO TABS
800.0000 mg | ORAL_TABLET | Freq: Once | ORAL | Status: AC
  Administered 2010-12-24: 800 mg via ORAL
  Filled 2010-12-24: qty 1
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
## 2010-12-24 NOTE — ED Notes (Signed)
Pt a/ox4. resp even and unlabored. NAD at this time. D/C instructions reviewed with pt. Pt verbalized understanding. Pt ambulated with steady gate to lobby. NAD at this time.

## 2010-12-24 NOTE — ED Notes (Signed)
ASO splint applied to right ankle and crutches fit to pt height.

## 2010-12-24 NOTE — ED Notes (Signed)
ICE applied to foot.

## 2010-12-24 NOTE — ED Provider Notes (Signed)
History     CSN: [id number]  Arrival date & time 12/24/10  1427   None     Chief Complaint  Patient presents with  . Fall  . Ankle Pain    (Consider location/radiation/quality/duration/timing/severity/associated sxs/prior treatment) Patient is a 25 y.o. female presenting with fall and ankle pain. The history is provided by the patient.  Fall The accident occurred 3 to 5 hours ago. The fall occurred while walking. Impact surface: asphault. There was no blood loss. Pertinent negatives include no abdominal pain and no hematuria.  Ankle Pain     History reviewed. No pertinent past medical history.  History reviewed. No pertinent past surgical history.  No family history on file.  History  Substance Use Topics  . Smoking status: Current Everyday Smoker -- 1.0 packs/day    Types: Cigarettes  . Smokeless tobacco: Not on file  . Alcohol Use: No    OB History    Grav Para Term Preterm Abortions TAB SAB Ect Mult Living   3 3  1             Review of Systems  Constitutional: Negative for activity change.       All ROS Neg except as noted in HPI  HENT: Negative for nosebleeds and neck pain.   Eyes: Negative for photophobia and discharge.  Respiratory: Negative for cough, shortness of breath and wheezing.   Cardiovascular: Negative for chest pain and palpitations.  Gastrointestinal: Negative for abdominal pain and blood in stool.  Genitourinary: Negative for dysuria, frequency and hematuria.  Musculoskeletal: Negative for back pain and arthralgias.  Skin: Negative.   Neurological: Negative for dizziness, seizures and speech difficulty.  Psychiatric/Behavioral: Negative for hallucinations and confusion.    Allergies  Review of patient's allergies indicates no known allergies.  Home Medications   Current Outpatient Rx  Name Route Sig Dispense Refill  . ETONOGESTREL 68 MG  IMPL Subcutaneous Inject 1 each into the skin once.        BP 106/54  Pulse 70  Temp(Src)  98.2 F (36.8 C) (Oral)  Resp 20  Ht 5\' 1"  (1.549 m)  Wt 130 lb (58.968 kg)  BMI 24.56 kg/m2  SpO2 100%  Physical Exam  Nursing note and vitals reviewed. Constitutional: She is oriented to person, place, and time. She appears well-developed and well-nourished.  Non-toxic appearance.  HENT:  Head: Normocephalic.  Right Ear: Tympanic membrane and external ear normal.  Left Ear: Tympanic membrane and external ear normal.  Eyes: EOM and lids are normal. Pupils are equal, round, and reactive to light.  Neck: Normal range of motion. Neck supple. Carotid bruit is not present.  Cardiovascular: Normal rate, regular rhythm, normal heart sounds, intact distal pulses and normal pulses.   Pulmonary/Chest: Breath sounds normal. No respiratory distress.  Abdominal: Soft. Bowel sounds are normal. There is no tenderness. There is no guarding.  Musculoskeletal: Normal range of motion.       Moderate swelling and tenderness to the right medial malleolus. Full range of motion of the toes. Achilles tendon intact. Full range of motion of the right knee and right hip.  Lymphadenopathy:       Head (right side): No submandibular adenopathy present.       Head (left side): No submandibular adenopathy present.    She has no cervical adenopathy.  Neurological: She is alert and oriented to person, place, and time. She has normal strength. No cranial nerve deficit or sensory deficit.  Skin: Skin is warm and  dry.  Psychiatric: She has a normal mood and affect. Her speech is normal.    ED Course  Procedures (including critical care time)  Labs Reviewed - No data to display Dg Foot Complete Right  12/24/2010  *RADIOLOGY REPORT*  Clinical Data: Stepped in a hole, pain and swelling right foot, hematoma  RIGHT FOOT COMPLETE - 3+ VIEW  Comparison: None  Findings: Joint spaces preserved. Osseous mineralization grossly normal for technique. No acute fracture, dislocation or bone destruction. Minimal dorsal soft tissue  swelling at mid foot.  IMPRESSION: No acute bony abnormalities.  Original Report Authenticated By: Lollie Marrow, M.D.    Dx: Ankle sprain (right)   MDM  I have reviewed nursing notes, vital signs, and all appropriate lab and imaging results for this patient. Patient has a medial malleolus sprain of the right ankle. Patient fitted with an ankle splint and crutches. Patient advised to apply ice and elevate the extremity. Orthopedic consultation has been provided should the need arise.       Kathie Dike, [place] 12/24/10 [phone]

## 2010-12-24 NOTE — ED Notes (Signed)
ASO splint applied to right foot.

## 2010-12-25 NOTE — ED Provider Notes (Signed)
Medical screening examination/treatment/procedure(s) were performed by non-physician practitioner and as supervising physician I was immediately available for consultation/collaboration.   Sherilee Smotherman, MD 12/25/10 0138 

## 2011-02-20 ENCOUNTER — Emergency Department (HOSPITAL_COMMUNITY)
Admission: EM | Admit: 2011-02-20 | Discharge: 2011-02-20 | Disposition: A | Discharge status: Home / Self Care | Payer: Self-pay | Attending: Emergency Medicine | Admitting: Emergency Medicine

## 2011-02-20 ENCOUNTER — Encounter (HOSPITAL_COMMUNITY): Payer: Self-pay

## 2011-02-20 DIAGNOSIS — F17200 Nicotine dependence, unspecified, uncomplicated: Secondary | ICD-10-CM | POA: Insufficient documentation

## 2011-02-20 DIAGNOSIS — K0889 Other specified disorders of teeth and supporting structures: Secondary | ICD-10-CM

## 2011-02-20 DIAGNOSIS — K089 Disorder of teeth and supporting structures, unspecified: Secondary | ICD-10-CM | POA: Insufficient documentation

## 2011-02-20 MED ORDER — PENICILLIN V POTASSIUM 500 MG PO TABS: 500.0000 mg | ORAL_TABLET | Freq: Four times a day (QID) | ORAL | Status: AC

## 2011-02-20 MED ORDER — HYDROCODONE-ACETAMINOPHEN 5-325 MG PO TABS: ORAL_TABLET | ORAL | Status: AC

## 2011-02-20 NOTE — ED Notes (Signed)
Left lower molar with pain since yesterday, denies having a dentist, will provide list of dental services in area for pt

## 2011-02-20 NOTE — ED Provider Notes (Signed)
History     CSN: [id number]  Arrival date & time 02/20/11  1439   First MD Initiated Contact with Patient 02/20/11 1503      Chief Complaint  Patient presents with  . Dental Pain    (Consider location/radiation/quality/duration/timing/severity/associated sxs/prior treatment) Patient is a 26 y.o. female presenting with tooth pain. The history is provided by the patient. No language interpreter was used.  Dental PainThe primary symptoms include mouth pain. Primary symptoms do not include dental injury, oral bleeding, headaches, fever, sore throat, angioedema or cough. The symptoms began 2 days ago. The symptoms are unchanged. The symptoms are new. The symptoms occur constantly.  Mouth pain began 24 -48 hours ago. Mouth pain occurs constantly. Mouth pain is unchanged. Affected locations include: gum(s) and teeth.  Additional symptoms include: dental sensitivity to temperature, gum swelling and gum tenderness. Additional symptoms do not include: purulent gums, trismus, facial swelling, trouble swallowing and drooling. Medical issues include: smoking and periodontal disease.    History reviewed. No pertinent past medical history.  History reviewed. No pertinent past surgical history.  No family history on file.  History  Substance Use Topics  . Smoking status: Current Everyday Smoker -- 1.0 packs/day    Types: Cigarettes  . Smokeless tobacco: Not on file  . Alcohol Use: No    OB History    Grav Para Term Preterm Abortions TAB SAB Ect Mult Living   3 3  1             Review of Systems  Constitutional: Negative for fever and appetite change.  HENT: Positive for dental problem. Negative for congestion, sore throat, facial swelling, drooling and trouble swallowing.   Respiratory: Negative for cough.   Gastrointestinal: Negative for nausea and vomiting.  Skin: Negative.   Neurological: Negative for dizziness, facial asymmetry and headaches.  Hematological: Negative for  adenopathy.  All other systems reviewed and are negative.    Allergies  Review of patient's allergies indicates no known allergies.  Home Medications   Current Outpatient Rx  Name Route Sig Dispense Refill  . ETONOGESTREL 68 MG [place] IMPL Subcutaneous Inject 1 each into the skin once.        BP 118/49  Pulse 82  Temp(Src) 98.2 F (36.8 C) (Oral)  Resp 20  Ht 5\' 1"  (1.549 m)  Wt 130 lb (58.968 kg)  BMI 24.56 kg/m2  SpO2 100%  Physical Exam  Nursing note and vitals reviewed. Constitutional: She is oriented to person, place, and time. She appears well-developed and well-nourished. No distress.  HENT:  Head: Normocephalic and atraumatic.  Right Ear: Tympanic membrane and ear canal normal.  Left Ear: Tympanic membrane and ear canal normal.  Mouth/Throat: Oropharynx is clear and moist. No oropharyngeal exudate.       ttp with mild erythema and edema of the left lower gums.   Cardiovascular: Normal rate, regular rhythm and normal heart sounds.   No murmur heard. Pulmonary/Chest: Effort normal and breath sounds normal. No respiratory distress.  Lymphadenopathy:    She has no cervical adenopathy.  Neurological: She is alert and oriented to person, place, and time. She exhibits normal muscle tone. Coordination normal.  Skin: Skin is warm and dry.    ED Course  Procedures (including critical care time)       MDM      Tender to palpation of the left lower gums at area of the molars. Mild erythema also present of the gums.  Probable impacted third molar.  No  facial swelling or trismus.  Patient agrees to close followup with a dentist.  I will prescribe short course of pain medications and PCN.   Patient / Family / Caregiver understand and agree with initial ED impression and plan with expectations set for ED visit. Pt stable in ED with no significant deterioration in condition.   Pearla Mckinny L. [place], [place] 02/23/11 1546

## 2011-02-20 NOTE — ED Notes (Signed)
Pt presents with lower left sided dental pain that started 2 days ago.

## 2011-02-20 NOTE — Discharge Instructions (Signed)
Dental Pain     Toothache is pain in or around a tooth. It may get worse with chewing or with cold or heat.   HOME CARE  · Your dentist may use a numbing medicine during treatment. If so, you may need to avoid eating until the medicine wears off. Ask your dentist about this.   · Only take medicine as told by your dentist or doctor.   · Avoid chewing food near the painful tooth until after all treatment is done. Ask your dentist about this.   GET HELP RIGHT AWAY IF:   · The problem gets worse or new problems appear.   · You have a fever.   · There is redness and puffiness (swelling) of the face, jaw, or neck.   · You cannot open your mouth.   · There is pain in the jaw.   · There is very bad pain that is not helped by medicine.   MAKE SURE YOU:   · Understand these instructions.   · Will watch your condition.   · Will get help right away if you are not doing well or get worse.   Document Released: 06/08/2007 Document Revised: 09/01/2010 Document Reviewed: 06/08/2007  [employer]® Patient Information ©2012 [employer].

## 2011-02-24 NOTE — ED Provider Notes (Signed)
Medical screening examination/treatment/procedure(s) were performed by non-physician practitioner and as supervising physician I was immediately available for consultation/collaboration.    Shelda Jakes, MD 02/24/11 [phone]

## 2011-12-14 ENCOUNTER — Emergency Department (HOSPITAL_COMMUNITY)
Admission: EM | Admit: 2011-12-14 | Discharge: 2011-12-14 | Disposition: A | Discharge status: Home / Self Care | Payer: Self-pay | Attending: Emergency Medicine | Admitting: Emergency Medicine

## 2011-12-14 ENCOUNTER — Emergency Department (HOSPITAL_COMMUNITY): Payer: Self-pay

## 2011-12-14 ENCOUNTER — Encounter (HOSPITAL_COMMUNITY): Payer: Self-pay | Admitting: Emergency Medicine

## 2011-12-14 DIAGNOSIS — Z79899 Other long term (current) drug therapy: Secondary | ICD-10-CM | POA: Insufficient documentation

## 2011-12-14 DIAGNOSIS — R42 Dizziness and giddiness: Secondary | ICD-10-CM | POA: Insufficient documentation

## 2011-12-14 DIAGNOSIS — R5381 Other malaise: Secondary | ICD-10-CM | POA: Insufficient documentation

## 2011-12-14 DIAGNOSIS — R55 Syncope and collapse: Secondary | ICD-10-CM | POA: Insufficient documentation

## 2011-12-14 DIAGNOSIS — R112 Nausea with vomiting, unspecified: Secondary | ICD-10-CM | POA: Insufficient documentation

## 2011-12-14 DIAGNOSIS — F17200 Nicotine dependence, unspecified, uncomplicated: Secondary | ICD-10-CM | POA: Insufficient documentation

## 2011-12-14 DIAGNOSIS — G8929 Other chronic pain: Secondary | ICD-10-CM | POA: Insufficient documentation

## 2011-12-14 DIAGNOSIS — Z3202 Encounter for pregnancy test, result negative: Secondary | ICD-10-CM | POA: Insufficient documentation

## 2011-12-14 DIAGNOSIS — IMO0001 Reserved for inherently not codable concepts without codable children: Secondary | ICD-10-CM | POA: Insufficient documentation

## 2011-12-14 DIAGNOSIS — M79609 Pain in unspecified limb: Secondary | ICD-10-CM | POA: Insufficient documentation

## 2011-12-14 HISTORY — DX: Pain in leg, unspecified: M79.606

## 2011-12-14 HISTORY — DX: Headache: R51

## 2011-12-14 HISTORY — DX: Headache, unspecified: R51.9

## 2011-12-14 HISTORY — DX: Other chronic pain: G89.29

## 2011-12-14 LAB — CBC WITH DIFFERENTIAL/PLATELET
Basophils Absolute: 0 10*3/uL (ref 0.0–0.1)
Basophils Relative: 0 % (ref 0–1)
Eosinophils Absolute: 0.2 10*3/uL (ref 0.0–0.7)
Eosinophils Relative: 2 % (ref 0–5)
HCT: 37.3 % (ref 36.0–46.0)
Hemoglobin: 12.7 g/dL (ref 12.0–15.0)
MCH: 30.9 pg (ref 26.0–34.0)
MCHC: 34 g/dL (ref 30.0–36.0)
MCV: 90.8 fL (ref 78.0–100.0)
Monocytes Absolute: 0.4 10*3/uL (ref 0.1–1.0)
Monocytes Relative: 5 % (ref 3–12)
Neutro Abs: 4.5 10*3/uL (ref 1.7–7.7)
RDW: 12.7 % (ref 11.5–15.5)

## 2011-12-14 LAB — COMPREHENSIVE METABOLIC PANEL
Albumin: 3.8 g/dL (ref 3.5–5.2)
Alkaline Phosphatase: 58 U/L (ref 39–117)
BUN: 8 mg/dL (ref 6–23)
Creatinine, Ser: 0.81 mg/dL (ref 0.50–1.10)
GFR calc Af Amer: >90 mL/min (ref >90)
Glucose, Bld: 94 mg/dL (ref 70–99)
Potassium: 4.1 mEq/L (ref 3.5–5.1)
Total Bilirubin: 0.4 mg/dL (ref 0.3–1.2)
Total Protein: 7 g/dL (ref 6.0–8.3)

## 2011-12-14 LAB — URINALYSIS, ROUTINE W REFLEX MICROSCOPIC
Bilirubin Urine: NEGATIVE
Glucose, UA: NEGATIVE mg/dL
Hgb urine dipstick: NEGATIVE
Ketones, ur: NEGATIVE mg/dL
Protein, ur: NEGATIVE mg/dL
Urobilinogen, UA: 0.2 mg/dL (ref 0.0–1.0)

## 2011-12-14 LAB — PREGNANCY, URINE: Preg Test, Ur: NEGATIVE

## 2011-12-14 MED ORDER — ONDANSETRON HCL 4 MG PO TABS: 4.0000 mg | ORAL_TABLET | Freq: Three times a day (TID) | ORAL | Status: DC | PRN

## 2011-12-14 MED ORDER — SODIUM CHLORIDE 0.9 % IV SOLN: INTRAVENOUS | Status: DC

## 2011-12-14 MED ORDER — SODIUM CHLORIDE 0.9 % IV BOLUS (SEPSIS)
1000.0000 mL | Freq: Once | INTRAVENOUS | Status: AC
  Administered 2011-12-14: 1000 mL via INTRAVENOUS

## 2011-12-14 MED ORDER — FAMOTIDINE IN NACL 20-0.9 MG/50ML-% IV SOLN
20.0000 mg | Freq: Once | INTRAVENOUS | Status: AC
  Administered 2011-12-14: 20 mg via INTRAVENOUS
  Filled 2011-12-14: qty 50

## 2011-12-14 MED ORDER — ONDANSETRON HCL 4 MG/2ML IJ SOLN
4.0000 mg | INTRAMUSCULAR | Status: DC | PRN
  Administered 2011-12-14: 4 mg via INTRAVENOUS
  Filled 2011-12-14: qty 2

## 2011-12-14 NOTE — ED Notes (Signed)
Oral trial given and pt held water down well. Nad.

## 2011-12-14 NOTE — ED Provider Notes (Signed)
History     CSN: [id number]  Arrival date & time 12/14/11  6962   First MD Initiated Contact with Patient 12/14/11 1020      Chief Complaint  Patient presents with  . Headache  . Generalized Body Aches  . Emesis  . Loss of Consciousness    HPI Pt was seen at 1050.  Per pt, c/o sudden onset and resolution of one brief episode of syncope yesterday.  Pt states she felt lightheaded and "passed out."  States she had several episodes of N/V, generalized body aches, fatigue, and poor PO intake for the past 2 days; which began "before I passed out."  Also c/o ongoing left calf "cramping" pain that "feels like it's balled up in a knot" for the past 1 year. Occurs mostly at night and has been unchanged for the past year.  States  she has been eval in the ED previously for same and told "it's not a blood clot."  Denies diarrhea, no black or blood in stools or emesis, no CP/palpitations, no abd pain, no back pain, no fevers.    Past Medical History  Diagnosis Date  . Chronic headaches   . Chronic leg pain     left lower leg    History reviewed. No pertinent past surgical history.   History  Substance Use Topics  . Smoking status: Current Every Day Smoker -- 1.0 packs/day    Types: Cigarettes  . Smokeless tobacco: Not on file  . Alcohol Use: No    OB History    Grav Para Term Preterm Abortions TAB SAB Ect Mult Living   3 3  1             Review of Systems ROS: Statement: All systems negative except as marked or noted in the HPI; Constitutional: Negative for fever and chills. +generalized weakness/fatigue, poor PO intake. ; ; Eyes: Negative for eye pain, redness and discharge. ; ; ENMT: Negative for ear pain, hoarseness, nasal congestion, sinus pressure and sore throat. ; ; Cardiovascular: Negative for chest pain, palpitations, diaphoresis, dyspnea and peripheral edema. ; ; Respiratory: Negative for cough, wheezing and stridor. ; ; Gastrointestinal: +N/V. Negative for diarrhea,  abdominal pain, blood in stool, hematemesis, jaundice and rectal bleeding. . ; ; Genitourinary: Negative for dysuria, flank pain and hematuria. ; ; Musculoskeletal: +left calf "pain." Negative for back pain and neck pain. Negative for swelling and trauma.; ; Skin: Negative for pruritus, rash, abrasions, blisters, bruising and skin lesion.; ; Neuro: Negative for headache and neck stiffness. Negative for weakness, altered level of consciousness , altered mental status, extremity weakness, paresthesias, involuntary movement, seizure and +lightheadedness, syncope.      Allergies  Review of patient's allergies indicates no known allergies.  Home Medications   Current Outpatient Rx  Name  Route  Sig  Dispense  Refill  . ETONOGESTREL 68 MG Time IMPL   Subcutaneous   Inject 1 each into the skin once.             BP 121/75  Pulse 88  Temp 98.3 F (36.8 C)  Resp 18  Ht 5\' 2"  (1.575 m)  Wt 130 lb (58.968 kg)  BMI 23.78 kg/m2  SpO2 98%  Physical Exam 1055: Physical examination:  Nursing notes reviewed; Vital signs and O2 SAT reviewed;  Constitutional: Well developed, Well nourished, Well hydrated, In no acute distress; Head:  Normocephalic, atraumatic; Eyes: EOMI, PERRL, No scleral icterus; ENMT: Mouth and pharynx normal, Mucous membranes moist; Neck: Supple, Full  range of motion, No lymphadenopathy; Cardiovascular: Regular rate and rhythm, No murmur, rub, or gallop; Respiratory: Breath sounds clear & equal bilaterally, No rales, rhonchi, wheezes.  Speaking full sentences with ease, Normal respiratory effort/excursion; Chest: Nontender, Movement normal; Abdomen: Soft, Nontender, Nondistended, Normal bowel sounds; Genitourinary: No CVA tenderness; Extremities: Pulses normal, No tenderness, No edema, No calf edema or asymmetry.; Neuro: AA&Ox3, Major CN grossly intact.  Speech clear. No gross focal motor or sensory deficits in extremities.; Skin: Color normal, Warm, Dry.   ED Course  Procedures     MDM  MDM Reviewed: nursing note, vitals and previous chart Interpretation: labs, ultrasound and x-ray    Date: 12/14/2011  Rate: 47  Rhythm: sinus bradycardia  QRS Axis: normal  Intervals: normal  ST/T Wave abnormalities: normal  Conduction Disutrbances:none  Narrative Interpretation:   Old EKG Reviewed: none available.  Results for orders placed during the hospital encounter of 12/14/11  URINALYSIS, ROUTINE W REFLEX MICROSCOPIC      Component Value Range   Color, Urine YELLOW  YELLOW   APPearance CLEAR  CLEAR   Specific Gravity, Urine 1.010  1.005 - 1.030   pH 6.5  5.0 - 8.0   Glucose, UA NEGATIVE  NEGATIVE mg/dL   Hgb urine dipstick NEGATIVE  NEGATIVE   Bilirubin Urine NEGATIVE  NEGATIVE   Ketones, ur NEGATIVE  NEGATIVE mg/dL   Protein, ur NEGATIVE  NEGATIVE mg/dL   Urobilinogen, UA 0.2  0.0 - 1.0 mg/dL   Nitrite NEGATIVE  NEGATIVE   Leukocytes, UA NEGATIVE  NEGATIVE  PREGNANCY, URINE      Component Value Range   Preg Test, Ur NEGATIVE  NEGATIVE  CBC WITH DIFFERENTIAL      Component Value Range   WBC 9.0  4.0 - 10.5 K/uL   RBC 4.11  3.87 - 5.11 MIL/uL   Hemoglobin 12.7  12.0 - 15.0 g/dL   HCT 16.1  09.6 - 04.5 %   MCV 90.8  78.0 - 100.0 fL   MCH 30.9  26.0 - 34.0 pg   MCHC 34.0  30.0 - 36.0 g/dL   RDW 40.9  81.1 - 91.4 %   Platelets 247  150 - 400 K/uL   Neutrophils Relative 50  43 - 77 %   Neutro Abs 4.5  1.7 - 7.7 K/uL   Lymphocytes Relative 43  12 - 46 %   Lymphs Abs 3.9  0.7 - 4.0 K/uL   Monocytes Relative 5  3 - 12 %   Monocytes Absolute 0.4  0.1 - 1.0 K/uL   Eosinophils Relative 2  0 - 5 %   Eosinophils Absolute 0.2  0.0 - 0.7 K/uL   Basophils Relative 0  0 - 1 %   Basophils Absolute 0.0  0.0 - 0.1 K/uL  LIPASE, BLOOD      Component Value Range   Lipase 29  11 - 59 U/L  COMPREHENSIVE METABOLIC PANEL      Component Value Range   Sodium 140  135 - 145 mEq/L   Potassium 4.1  3.5 - 5.1 mEq/L   Chloride 107  96 - 112 mEq/L   CO2 25  19 - 32  mEq/L   Glucose, Bld 94  70 - 99 mg/dL   BUN 8  6 - 23 mg/dL   Creatinine, Ser 7.82  0.50 - 1.10 mg/dL   Calcium 8.9  8.4 - 95.6 mg/dL   Total Protein 7.0  6.0 - 8.3 g/dL   Albumin 3.8  3.5 -  5.2 g/dL   AST 15  0 - 37 U/L   ALT 6  0 - 35 U/L   Alkaline Phosphatase 58  39 - 117 U/L   Total Bilirubin 0.4  0.3 - 1.2 mg/dL   GFR calc non Af Amer >90  >90 mL/min   GFR calc Af Amer >90  >90 mL/min   Dg Chest 2 View 12/14/2011  *RADIOLOGY REPORT*  Clinical Data: Syncope, nausea  CHEST - 2 VIEW  Comparison: None.  Findings: Cardiomediastinal silhouette is unremarkable.  No acute infiltrate or pleural effusion.  No pulmonary edema.  Bony thorax is unremarkable.  IMPRESSION: No active disease.   Original Report Authenticated By: Natasha Mead, M.D.    Ct Head Wo Contrast 12/14/2011  *RADIOLOGY REPORT*  Clinical Data: Syncope  CT HEAD WITHOUT CONTRAST  Technique:  Contiguous axial images were obtained from the base of the skull through the vertex without contrast.  Comparison: 03/18/2006  Findings: There is no evidence for acute hemorrhage, hydrocephalus, mass lesion, or abnormal extra-axial fluid collection.  No definite CT evidence of acute infarct.  Bone windows show the visualized portions of the paranasal sinuses to be clear.  IMPRESSION: Stable.  Normal exam.   Original Report Authenticated By: Kennith Center, M.D.    US Venous Img Lower Unilateral Left 12/14/2011  *RADIOLOGY REPORT*  Clinical data:  Left lower extremity pain, specifically cramping in the left calf.  LEFT LOWER EXTREMITY VENOUS DOPPLER ULTRASOUND  Technique:  Gray-scale sonography with compression, as well as color and duplex Doppler ultrasound, were performed to evaluate the deep venous system from the level of the common femoral vein through the popliteal and proximal calf veins. Evaluation also included physiologic response to augmentation.  Comparison: Left lower extremity venous Doppler ultrasound 05/28/2010.  Findings: All of the  visualized deep veins in the left lower extremity demonstrate normal Doppler signal, normal compressibility, normal phasicity, and normal physiologic response to augmentation.  No gray scale filling defects were identified. No significant interval change.  IMPRESSION: No evidence of left lower extremity DVT.   Original Report Authenticated By: Hulan Saas, M.D.      1300: Pt wants to go home now.  Pt has tol PO well in the ED without N/V.  Not orthostatic.  Doubt PE with negative LLE vascular study, low risk Wells.  Dx and testing d/w pt and family.  Questions answered.  Verb understanding, agreeable to d/c home with outpt f/u.           Laray Anger, DO 12/15/11 2213

## 2011-12-14 NOTE — ED Notes (Signed)
Pt c/o ha/body aches/n/v/ since yesterday with syncopal episode yesterday.

## 2011-12-14 NOTE — ED Notes (Signed)
RN at bedside

## 2011-12-14 NOTE — ED Notes (Signed)
Pt taken to US at this time

## 2012-10-15 ENCOUNTER — Encounter: Payer: Self-pay | Admitting: Family Medicine

## 2013-01-17 ENCOUNTER — Telehealth: Payer: Self-pay | Admitting: *Deleted

## 2013-01-17 NOTE — Telephone Encounter (Signed)
Pt states if she gets implanon removed and reinserted does she have to wait her 3 years to get a tubal ligation. Pt informed no, but if she wanted future pregnancies nexplanon would be the better option vs tubal. Pt verbalized understanding.

## 2013-10-01 ENCOUNTER — Ambulatory Visit: Payer: Self-pay | Admitting: Adult Health

## 2013-10-02 ENCOUNTER — Ambulatory Visit (INDEPENDENT_AMBULATORY_CARE_PROVIDER_SITE_OTHER): Payer: BC Managed Care – PPO | Admitting: Adult Health

## 2013-10-02 ENCOUNTER — Encounter: Payer: Self-pay | Admitting: Adult Health

## 2013-10-02 VITALS — BP 110/58 | Ht 61.0 in | Wt 138.0 lb

## 2013-10-02 DIAGNOSIS — R1032 Left lower quadrant pain: Secondary | ICD-10-CM

## 2013-10-02 DIAGNOSIS — R300 Dysuria: Secondary | ICD-10-CM | POA: Insufficient documentation

## 2013-10-02 DIAGNOSIS — N898 Other specified noninflammatory disorders of vagina: Secondary | ICD-10-CM

## 2013-10-02 DIAGNOSIS — R319 Hematuria, unspecified: Secondary | ICD-10-CM

## 2013-10-02 HISTORY — DX: Left lower quadrant pain: R10.32

## 2013-10-02 HISTORY — DX: Other specified noninflammatory disorders of vagina: N89.8

## 2013-10-02 HISTORY — DX: Hematuria, unspecified: R31.9

## 2013-10-02 HISTORY — DX: Dysuria: R30.0

## 2013-10-02 LAB — POCT URINALYSIS DIPSTICK
Glucose, UA: NEGATIVE
LEUKOCYTES UA: NEGATIVE
NITRITE UA: NEGATIVE
PROTEIN UA: NEGATIVE

## 2013-10-02 LAB — POCT WET PREP (WET MOUNT)
TRICHOMONAS WET PREP HPF POC: NEGATIVE
WBC WET PREP: POSITIVE

## 2013-10-02 MED ORDER — NITROFURANTOIN MONOHYD MACRO 100 MG PO CAPS: 100.0000 mg | ORAL_CAPSULE | Freq: Two times a day (BID) | ORAL | Status: DC

## 2013-10-02 MED ORDER — IBUPROFEN 800 MG PO TABS: 800.0000 mg | ORAL_TABLET | Freq: Three times a day (TID) | ORAL | Status: DC | PRN

## 2013-10-02 NOTE — Progress Notes (Signed)
Subjective:     Patient ID: [name] Taylor, female   DOB: [birth year], 28 y.o.   MRN: [id number]  HPI [name] MilletMegan is a 28 year old white female in complaining of burning and frequency with urination and pain in left side and low back,denies andy fever,nausea or vomiting or bowel changes, does get calf cramps at times.works third shift.No new sex partners.  Review of Systems See HPI Reviewed past medical,surgical, social and family history. Reviewed medications and allergies.     Objective:   Physical Exam BP 110/58  Ht 5\' 1"  (1.549 m)  Wt 138 lb (62.596 kg)  BMI 26.09 kg/m2   urine trace blood  Skin warm and dry.Pelvic: external genitalia is normal in appearance, vagina: white discharge with slight odor, cervix:smooth and bulbous, uterus: normal size, shape and contour, non tender, no masses felt, adnexa: no masses, slightly tender over bladder and LLQ. Wet prep:  +WBCs. GC/CHL obtained.  No CVAT has some low back pain,goood bowel sounds.try magnesium for cramps or yellow mustard.  Assessment:     Burning with urination Hematuria LLQ pain Vaginal discharge    Plan:    UA C& S and GC/CHL sent Rx Macrobid 1 bid x 7 days Rx motrin 800 mg #60 1 every 8 hours prn pain with 1 refill Follow up in 1 week, if not better may get US  Decrease sodas and increase water

## 2013-10-02 NOTE — Patient Instructions (Addendum)
Decrease cigs Increase water Take macrobid 1 bid x 7 days Return in 1 week

## 2013-10-03 LAB — URINALYSIS
Bilirubin Urine: NEGATIVE
GLUCOSE, UA: NEGATIVE mg/dL
HGB URINE DIPSTICK: NEGATIVE
Ketones, ur: NEGATIVE mg/dL
LEUKOCYTES UA: NEGATIVE
Nitrite: NEGATIVE
PH: 5.5 (ref 5.0–8.0)
Protein, ur: NEGATIVE mg/dL
SPECIFIC GRAVITY, URINE: 1.02 (ref 1.005–1.030)
Urobilinogen, UA: 0.2 mg/dL (ref 0.0–1.0)

## 2013-10-03 LAB — GC/CHLAMYDIA PROBE AMP
CT PROBE, AMP APTIMA: NEGATIVE
GC PROBE AMP APTIMA: NEGATIVE

## 2013-10-04 LAB — URINE CULTURE
Colony Count: NO GROWTH
Organism ID, Bacteria: NO GROWTH

## 2013-10-08 ENCOUNTER — Ambulatory Visit (INDEPENDENT_AMBULATORY_CARE_PROVIDER_SITE_OTHER): Payer: BC Managed Care – PPO | Admitting: Adult Health

## 2013-10-08 ENCOUNTER — Encounter: Payer: Self-pay | Admitting: Adult Health

## 2013-10-08 ENCOUNTER — Ambulatory Visit: Payer: Self-pay | Admitting: Adult Health

## 2013-10-08 VITALS — BP 90/60 | Ht 61.0 in | Wt 140.5 lb

## 2013-10-08 DIAGNOSIS — L299 Pruritus, unspecified: Secondary | ICD-10-CM

## 2013-10-08 NOTE — Patient Instructions (Signed)
Try benadryl Try Cortaid 10/ hydrocortisone cream use thin layer 2-3 x daily

## 2013-10-08 NOTE — Progress Notes (Signed)
Subjective:     Patient ID: [name] Taylor, female   DOB: December 19, 1985, 28 y.o.   MRN: [id number]  HPI [name] MilletMegan is a 28 year old white female, back in follow up of burning with urination and LLQ pain and it has resolved, feels better.But left arm started itching last night.and has headache, thinks headache maybe where stopped sodas.  Review of Systems See HPI Reviewed past medical,surgical, social and family history. Reviewed medications and allergies.     Objective:   Physical Exam BP 90/60  Ht 5\' 1"  (1.549 m)  Wt 140 lb 8 oz (63.73 kg)  BMI 26.56 kg/m2   Left arm itching, No lesions or bumps seen   Assessment:     Itching left arm   Resolved burning with urination and LLQ pain  Plan:    Ok to drink 1 soda a day but keep drinking more water Try hydrocortisone cream like Cortaid 10 2-3 x daily thin layer to arm And try benadryl po Follow up prn

## 2013-10-09 ENCOUNTER — Ambulatory Visit: Payer: BC Managed Care – PPO | Admitting: Adult Health

## 2013-11-04 ENCOUNTER — Encounter: Payer: Self-pay | Admitting: Adult Health

## 2014-01-29 ENCOUNTER — Ambulatory Visit (INDEPENDENT_AMBULATORY_CARE_PROVIDER_SITE_OTHER): Payer: BLUE CROSS/BLUE SHIELD | Admitting: Nurse Practitioner

## 2014-01-29 ENCOUNTER — Encounter: Payer: Self-pay | Admitting: Nurse Practitioner

## 2014-01-29 VITALS — BP 122/78 | Ht 61.0 in | Wt 144.0 lb

## 2014-01-29 DIAGNOSIS — G4762 Sleep related leg cramps: Secondary | ICD-10-CM

## 2014-01-29 DIAGNOSIS — R5383 Other fatigue: Secondary | ICD-10-CM

## 2014-01-29 DIAGNOSIS — Z13220 Encounter for screening for lipoid disorders: Secondary | ICD-10-CM

## 2014-01-29 MED ORDER — CYCLOBENZAPRINE HCL 10 MG PO TABS: 10.0000 mg | ORAL_TABLET | Freq: Three times a day (TID) | ORAL | Status: DC | PRN

## 2014-02-01 ENCOUNTER — Encounter: Payer: Self-pay | Admitting: Nurse Practitioner

## 2014-02-01 NOTE — Progress Notes (Signed)
Subjective:  Presents for complaints of bilateral leg cramps that occurs at nighttime for the past several days. Has occurred off and on for the past few months. Has noticed since working 52-56 hours per week, constantly stands on a hard floor. Some fatigue.  Better when she is not working. Is due for routine labs.   Objective:   BP 122/78 mmHg  Ht 5\' 1"  (1.549 m)  Wt 144 lb (65.318 kg)  BMI 27.22 kg/m2 NAD. Alert, oriented. Lungs clear. Heart RRR. Lower extremities no edema.   Assessment: Nocturnal leg cramps - Plan: CBC with Differential/Platelet, Basic metabolic panel  Screening for lipid disorders - Plan: Lipid panel  Other fatigue - Plan: CBC with Differential/Platelet, Hepatic function panel, Basic metabolic panel, TSH, Vit D  25 hydroxy (rtn osteoporosis monitoring)  Plan:  Meds ordered this encounter  Medications  . cyclobenzaprine (FLEXERIL) 10 MG tablet    Sig: Take 1 tablet (10 mg total) by mouth 3 (three) times daily as needed for muscle spasms.    Dispense:  30 tablet    Refill:  0    Order Specific Question:  Supervising Provider    Answer:  Merlyn AlbertLUKING, WILLIAM S [[id number]]   Daily magnesium supplement. Labs pending. Use flexeril at night. Drowsiness precautions. Call back if worsens or persists.

## 2014-02-10 ENCOUNTER — Other Ambulatory Visit: Payer: Self-pay

## 2014-02-10 DIAGNOSIS — Z79899 Other long term (current) drug therapy: Secondary | ICD-10-CM

## 2014-02-10 DIAGNOSIS — R5383 Other fatigue: Secondary | ICD-10-CM

## 2014-02-10 DIAGNOSIS — Z13220 Encounter for screening for lipoid disorders: Secondary | ICD-10-CM

## 2014-03-31 ENCOUNTER — Emergency Department (HOSPITAL_COMMUNITY)
Admission: EM | Admit: 2014-03-31 | Discharge: 2014-03-31 | Discharge status: Against Medical Advice | Payer: BLUE CROSS/BLUE SHIELD | Source: Home / Self Care

## 2014-03-31 ENCOUNTER — Encounter (HOSPITAL_COMMUNITY): Payer: Self-pay | Admitting: Emergency Medicine

## 2014-03-31 ENCOUNTER — Emergency Department (HOSPITAL_COMMUNITY)
Admission: EM | Admit: 2014-03-31 | Discharge: 2014-03-31 | Disposition: A | Discharge status: Home / Self Care | Payer: BLUE CROSS/BLUE SHIELD | Attending: Emergency Medicine | Admitting: Emergency Medicine

## 2014-03-31 DIAGNOSIS — Z8739 Personal history of other diseases of the musculoskeletal system and connective tissue: Secondary | ICD-10-CM | POA: Insufficient documentation

## 2014-03-31 DIAGNOSIS — R112 Nausea with vomiting, unspecified: Secondary | ICD-10-CM | POA: Diagnosis not present

## 2014-03-31 DIAGNOSIS — Z8742 Personal history of other diseases of the female genital tract: Secondary | ICD-10-CM | POA: Insufficient documentation

## 2014-03-31 DIAGNOSIS — Z720 Tobacco use: Secondary | ICD-10-CM

## 2014-03-31 DIAGNOSIS — M549 Dorsalgia, unspecified: Secondary | ICD-10-CM | POA: Insufficient documentation

## 2014-03-31 DIAGNOSIS — G43909 Migraine, unspecified, not intractable, without status migrainosus: Secondary | ICD-10-CM | POA: Insufficient documentation

## 2014-03-31 DIAGNOSIS — G8929 Other chronic pain: Secondary | ICD-10-CM | POA: Insufficient documentation

## 2014-03-31 DIAGNOSIS — Z79899 Other long term (current) drug therapy: Secondary | ICD-10-CM | POA: Diagnosis not present

## 2014-03-31 DIAGNOSIS — R51 Headache: Secondary | ICD-10-CM | POA: Diagnosis present

## 2014-03-31 MED ORDER — DIPHENHYDRAMINE HCL 25 MG PO CAPS
50.0000 mg | ORAL_CAPSULE | Freq: Once | ORAL | Status: AC
  Administered 2014-03-31: 50 mg via ORAL
  Filled 2014-03-31: qty 2

## 2014-03-31 MED ORDER — KETOROLAC TROMETHAMINE 60 MG/2ML IM SOLN
60.0000 mg | Freq: Once | INTRAMUSCULAR | Status: AC
  Administered 2014-03-31: 60 mg via INTRAMUSCULAR
  Filled 2014-03-31: qty 2

## 2014-03-31 MED ORDER — PROMETHAZINE HCL 25 MG PO TABS: 25.0000 mg | ORAL_TABLET | Freq: Four times a day (QID) | ORAL | Status: DC | PRN

## 2014-03-31 MED ORDER — PROMETHAZINE HCL 25 MG/ML IJ SOLN
25.0000 mg | Freq: Once | INTRAMUSCULAR | Status: AC
  Administered 2014-03-31: 25 mg via INTRAMUSCULAR
  Filled 2014-03-31: qty 1

## 2014-03-31 NOTE — ED Provider Notes (Signed)
CSN: [id number]     Arrival date & time 03/31/14  1323 History   First MD Initiated Contact with Patient 03/31/14 1417     Chief Complaint  Patient presents with  . Migraine      HPI Pt was seen at 1425.  Per pt, c/o gradual onset and persistence of constant acute flair of her chronic migraine headache since yesterday. Has been associated with N/V.  Describes the headache as per her usual chronic migraine headache pain pattern for many years. Pt took motrin yesterday without improvement.  Denies headache was sudden or maximal in onset or at any time.  Denies visual changes, no focal motor weakness, no tingling/numbness in extremities, no fevers, no neck pain, no rash.      Past Medical History  Diagnosis Date  . Chronic headaches   . Chronic leg pain     left lower leg  . Burning with urination 10/02/2013  . Hematuria 10/02/2013  . LLQ pain 10/02/2013  . Vaginal discharge 10/02/2013   History reviewed. No pertinent past surgical history.   Family History  Problem Relation Age of Onset  . Other Maternal Grandmother     abnormal heart beat   History  Substance Use Topics  . Smoking status: Current Every Day Smoker -- 1.00 packs/day    Types: Cigarettes  . Smokeless tobacco: Never Used  . Alcohol Use: No   OB History    Gravida Para Term Preterm AB TAB SAB Ectopic Multiple Living   Review of Systems ROS: Statement: All systems negative except as marked or noted in the HPI; Constitutional: Negative for fever and chills. ; ; Eyes: Negative for eye pain, redness and discharge. ; ; ENMT: Negative for ear pain, hoarseness, nasal congestion, sinus pressure and sore throat. ; ; Cardiovascular: Negative for chest pain, palpitations, diaphoresis, dyspnea and peripheral edema. ; ; Respiratory: Negative for cough, wheezing and stridor. ; ; Gastrointestinal: +N/V. Negative for diarrhea, abdominal pain, blood in stool, hematemesis, jaundice and rectal bleeding. . ; ;  Genitourinary: Negative for dysuria, flank pain and hematuria. ; ; Musculoskeletal: Negative for back pain and neck pain. Negative for swelling and trauma.; ; Skin: Negative for pruritus, rash, abrasions, blisters, bruising and skin lesion.; ; Neuro: +headache. Negative for lightheadedness and neck stiffness. Negative for weakness, altered level of consciousness , altered mental status, extremity weakness, paresthesias, involuntary movement, seizure and syncope.     Allergies  Review of patient's allergies indicates no known allergies.  Home Medications   Prior to Admission medications   Medication Sig Start Date End Date Taking? Authorizing Provider  cyclobenzaprine (FLEXERIL) 10 MG tablet Take 1 tablet (10 mg total) by mouth 3 (three) times daily as needed for muscle spasms. 01/29/14  Yes Campbell Riches, NP  etonogestrel (IMPLANON) 68 MG IMPL implant Inject 1 each into the skin once.     Yes Historical Provider, MD   BP 159/89 mmHg  Pulse 73  Temp(Src) 97.8 F (36.6 C) (Oral)  Resp 18  Ht  (1.549 m)  Wt 140 lb (63.504 kg)  BMI 26.47 kg/m2  SpO2 100% Physical Exam  1430: Physical examination:  Nursing notes reviewed; Vital signs and O2 SAT reviewed;  Constitutional: Well developed, Well nourished, Well hydrated, In no acute distress; Head:  Normocephalic, atraumatic; Eyes: EOMI, PERRL, No scleral icterus; ENMT: TM's clear bilat. +edemetous nasal turbinates bilat with clear rhinorrhea. Mouth and pharynx  normal, Mucous membranes moist; Neck: Supple, Full range of motion, No lymphadenopathy; Cardiovascular: Regular rate and rhythm, No murmur, rub, or gallop; Respiratory: Breath sounds clear & equal bilaterally, No rales, rhonchi, wheezes.  Speaking full sentences with ease, Normal respiratory effort/excursion; Chest: Nontender, Movement normal; Abdomen: Soft, Nontender, Nondistended, Normal bowel sounds; Genitourinary: No CVA tenderness; Extremities: Pulses normal, No tenderness, No  edema, No calf edema or asymmetry.; Neuro: AA&Ox3, Major CN grossly intact.  Speech clear. No gross focal motor or sensory deficits in extremities.; Skin: Color normal, Warm, Dry.   ED Course  Procedures     EKG Interpretation None      MDM  MDM Reviewed: previous chart, nursing note and vitals     1540:  Improving after meds. Wants to go home now. Dx d/w pt and family.  Questions answered.  Verb understanding, agreeable to d/c home with outpt f/u.   Samuel JesterKathleen Zoe Nordin, DO 04/03/14 1329

## 2014-03-31 NOTE — ED Notes (Signed)
Pt LWBS. pt ambulated out of department and reported "i am going to urgent care." nad noted.

## 2014-03-31 NOTE — ED Notes (Signed)
Started with headache and vomiting yesterday.  Rates pain 9/10.  Have not taken any meds today.

## 2014-03-31 NOTE — ED Notes (Signed)
C/o migraine since yesterday.  Rates 10/10.  No medication today.

## 2014-03-31 NOTE — Discharge Instructions (Signed)
°Emergency Department Resource Guide °1) Find a Doctor and Pay Out of Pocket °Although you won't have to find out who is covered by your insurance plan, it is a good idea to ask around and get recommendations. You will then need to call the office and see if the doctor you have chosen will accept you as a new patient and what types of options they offer for patients who are self-pay. Some doctors offer discounts or will set up payment plans for their patients who do not have insurance, but you will need to ask so you aren't surprised when you get to your appointment. ° °2) Contact Your Local Health Department °Not all health departments have doctors that can see patients for sick visits, but many do, so it is worth a call to see if yours does. If you don't know where your local health department is, you can check in your phone book. The CDC also has a tool to help you locate your state's health department, and many state websites also have listings of all of their local health departments. ° °3) Find a Walk-in Clinic °If your illness is not likely to be very severe or complicated, you may want to try a walk in clinic. These are popping up all over the country in pharmacies, drugstores, and shopping centers. They're usually staffed by nurse practitioners or physician assistants that have been trained to treat common illnesses and complaints. They're usually fairly quick and inexpensive. However, if you have serious medical issues or chronic medical problems, these are probably not your best option. ° °No Primary Care Doctor: °- Call [facility] at  832-8000 - they can help you locate a primary care doctor that  accepts your insurance, provides certain services, etc. °- Physician Referral Service- 1-800-533-3463 ° °Chronic Pain Problems: °Organization         Address  Phone   Notes  °[facility]  (336) 297-2271 Patients need to be referred by their primary care doctor.  ° °Medication  Assistance: °Organization         Address  Phone   Notes  °[place] [facility] [address] °[place], [place] [address] (336) 641-8030 --Must be a resident of [place] °-- Must have NO insurance coverage whatsoever (no Medicaid/ Medicare, etc.) °-- The pt. MUST have a primary care doctor that directs their care regularly and follows them in the community °  °[facility]  (866) 331-1348   °[employer]  (888) 892-1162   ° °Agencies that provide inexpensive medical care: °Organization         Address  Phone   Notes  °[facility]  (336) 832-8035   °[facility]    (336) 832-7272   °[facility] [address] °[place], [place] [address] (336) 832-4777   °[facility] [address], °[place] (336) 271-4999   °[facility]    (336) 373-0678   °[facility]    (336) 272-1050   °[facility] ° [address], [place] Phone:  (336) 832-4444, Fax:  (336) 832-4440 Hours of Operation:  9 am - 6 pm, M-F.  Also accepts Medicaid/Medicare and self-pay.  °[facility] ° [address], [place] Phone: (336) 832-3150, Fax: (336) [phone]. Hours of Operation:  8:30 am - 5:30 pm, M-F.  Also accepts Medicaid and self-pay.  °[facility] [place] [address], [place] Phone: (336) 878-6027   °[facility] [address], [place], [place] (336)723-1848, Ext. 123 Mondays & Thursdays: 7-9 AM.  First 15 patients are seen on a first come, first serve basis. °  ° °Medicaid-accepting [place] Providers: ° °Organization         Address  Phone   Notes  °[facility] [address], [place] (336) 641-2100 Also accepts self-pay patients.  °[facility] [address], [place] ° (336) 856-9996   °[facility] [address], [place]  (336) 288-8857   °[facility] [address] [place] [address], [place] (336) 299-7000   °Veita Bland [address], [place]  ° (336) 373-1557 Only accepts [place] Access Medicaid patients after they have their name applied to their card.  ° °Self-Pay (no insurance) in [place]: ° °Organization         Address  Phone   Notes  °Sickle Cell Patients, [facility] [address], [place] (336) 832-1970   °[facility] [address], [place] (336) 832-4400   °[facility] ° [address], [place] (336) 992-4800   °[facility]/Dr. Osei-Bonsu ° [address] [place] [address], [place] or [address], [place] (336) 841-8500 Phone number for both [place] and [place] locations is the same.  °[facility] [address], [place] (336) 299-0000   °[facility] [address] [place] [address], [place] or [address] (336) 852-7530 °(336) 878-2260   °[facility] [address], [place] (336) 350-1642, phone; (336) 294-5005, fax Sees patients 1st and 3rd Saturday of every month.  Must not qualify for public or private insurance (i.e. Medicaid, Medicare, [place] Health Choice, Veterans' Benefits) • Household income should be no more than 200% of the poverty level •The clinic cannot treat you if you are pregnant or think you are pregnant • Sexually transmitted diseases are not treated at the clinic.  ° ° °Dental Care: °Organization         Address  Phone  Notes  °[place] [facility] [address], [place] (336) 641-6152 Accepts children up to age 21 who are enrolled in Medicaid or [place] Health Choice; pregnant women with a Medicaid card; and children who have applied for Medicaid or [place] Health Choice, but were declined, whose parents can pay a reduced fee at time of service.  °[place]  [facility] [place]  [address], [place] (336) 641-7733 Accepts children up to age 21 who are enrolled in Medicaid or [place] Health Choice; pregnant women with a Medicaid card; and children who have applied for Medicaid or [place] Health Choice, but were declined, whose parents can pay a reduced fee at time of service.  °[facility] ° [address], [place] (336) 641-4533 Patients are seen by appointment only. Walk-ins are not accepted. [facility] will see patients 18 years of age and older. °Monday - Tuesday (8am-5pm) °Most Wednesdays (8:30-5pm) °$30 per visit, cash only  °[facility] ° [address], [place] (336) 641-4533 Patients are seen by appointment only. Walk-ins are not accepted. [facility] will see patients 18 years of age and older. °One   Wednesday Evening (Monthly: Volunteer Based).  $30 per visit, cash only  °[facility]  (919) 537-3737 for adults; Children under age 4, call Graduate Pediatric Dentistry at ([phone]) [phone]. Children aged 4-14, please call ([phone]) [phone] to request a pediatric application. ° Dental services are provided in all areas of dental care including fillings, crowns and bridges, complete and partial dentures, implants, gum treatment, root canals, and extractions. Preventive care is also provided. Treatment is provided to both adults and children. °Patients are selected via a lottery and there is often a waiting list. °  °[facility] [address], °[place] ° (336) 763-8833 [email or web address] °  °[facility] [address], [place], [place] (336)723-1848, Ext. 123 Second and Fourth Thursday of each month, opens at 6:30 AM; Clinic ends at 9 AM.  Patients are seen on a first-come first-served basis, and a limited number are seen during each clinic.  ° °[facility] ° [address], [place], [place] (336) 723-7904    Eligibility Requirements °You must have lived in [place], [place], or [place] counties for at least the last three months. °  You cannot be eligible for state or federal sponsored healthcare insurance, including Veterans Administration, Medicaid, or Medicare. °  You generally cannot be eligible for healthcare insurance through your employer.  °  How to apply: °Eligibility screenings are held every Tuesday and Wednesday afternoon from 1:00 pm until 4:00 pm. You do not need an appointment for the interview!  °[facility] [address], [place], [place] 336-631-2330   °[facility]  336-342-8273   °[facility]  336-703-3100   °[facility]  336-570-6415   ° °Behavioral Health Resources in the Community: °Intensive Outpatient Programs °Organization         Address  Phone  Notes  °[facility] [address], [place], [place] 336-878-6098   °[facility] [address], [place], [place] 336-832-9800   °[facility] [address], [place], [place] ° 336-882-2125   °[facility] [address],  °[place], [place] 1-800-853-5163 or 336-641-4981   °Substance Abuse Resources °Organization         Address  Phone  Notes  °[facility]  336-882-2125   °[facility]  336-784-9470   °[facility]  336-285-9073   °[facility]  336-845-3988   °Residential & Outpatient Substance Abuse Program  1-800-659-3381   °Psychological Services °Organization         Address  Phone  Notes  °[facility]  336- 832-9600   °[facility]  336- 378-7881   °[facility] [address], [place] 1-800-853-5163 or 336-641-4981   ° °Mobile Crisis Teams °Organization         Address  Phone  Notes  °[facility], Mobile Crisis Care Unit  1-877-626-1772   °Assertive °[facility] ° [address]  [place], [place] 336-834-9664   °Sharon DeEsch [address] °[place] 336-554-5454   ° °Self-Help/Support Groups °Organization         Address  Phone             Notes  °[facility]  - variety of support groups  336- 373-1402 Call for more information  °Narcotics Anonymous (NA), [facility] [address], °[place]  2 meetings at this location  ° °  Residential Treatment Programs Organization         Address  Phone  Notes  [facility] [address],    [place]  [phone]   [facility]  [address], [place] [phone], [place], [place] [phone]   [facility] [address], [place] [phone] Admissions: 8am-3pm M-F  [facility] [address]    [place], [place] [phone]   [facility] [address], [place], [place] [phone]) [phone]   [facility] [address]  [place], [place] [phone]   [facility] Intensive Outpatient [address], [place], [place] [phone]   [facility] ([facility] [address]  [place], [place] [phone] or [phone]   [facility] [address] [place], [place] [phone] Accepts Medicaid  [facility] [address]  [place] [phone] Substance Abuse/Addiction Treatment   [facility] Organization         Address  Phone  Notes  [facility]  [phone](888) [phone]   Angie FavaJulie Brannon, PhD [address] [place], [place]   ([phone]) [phone](336) [phone] or (321) [phone](336) ([phone]   [facility]   [address] [place], [place] [phone](336) [phone]   [facility] [address], [place], [place] [phone](336) [phone] Insurance/Medicaid/sponsorship through [facility] [address]                                    [place], [place] [phone](336) [phone] Therapy/tele-psych/case    [facility] [address]   [place], [place] [phone](336) [phone]    Dr. Lolly MustacheArfeen  [phone](336) [phone]   [facility]  [employer] [facility] 1) [address], [place] 2) [address], [place] 3)  [address], [place] [phone](336) [phone](336) [phone]  [phone](336) [phone]   [facility] Child [facility] (608) 690-0230(336) 917-202-9358 or [phone](336) [phone])[phone] (After Hours)      Take over the counter tylenol, ibuprofen, (OR Excedrin) and benadryl, as directed on packaging, with the prescription given to you today, as needed for headache.  Keep a headache diary, as discussed.  Call your regular medical doctor today to schedule a follow up appointment within the next 3 days.  Return to the Emergency Department immediately sooner if worsening.

## 2014-04-17 ENCOUNTER — Encounter (HOSPITAL_COMMUNITY): Payer: Self-pay | Admitting: *Deleted

## 2014-04-17 ENCOUNTER — Emergency Department (HOSPITAL_COMMUNITY)
Admission: EM | Admit: 2014-04-17 | Discharge: 2014-04-17 | Disposition: A | Discharge status: Home / Self Care | Payer: BLUE CROSS/BLUE SHIELD | Attending: Emergency Medicine | Admitting: Emergency Medicine

## 2014-04-17 ENCOUNTER — Emergency Department (HOSPITAL_COMMUNITY): Payer: BLUE CROSS/BLUE SHIELD

## 2014-04-17 DIAGNOSIS — Z720 Tobacco use: Secondary | ICD-10-CM | POA: Insufficient documentation

## 2014-04-17 DIAGNOSIS — M25532 Pain in left wrist: Secondary | ICD-10-CM | POA: Diagnosis present

## 2014-04-17 DIAGNOSIS — G8929 Other chronic pain: Secondary | ICD-10-CM | POA: Diagnosis not present

## 2014-04-17 DIAGNOSIS — M778 Other enthesopathies, not elsewhere classified: Secondary | ICD-10-CM | POA: Diagnosis not present

## 2014-04-17 MED ORDER — HYDROCODONE-ACETAMINOPHEN 5-325 MG PO TABS: ORAL_TABLET | ORAL | Status: DC

## 2014-04-17 MED ORDER — NAPROXEN 500 MG PO TABS: 500.0000 mg | ORAL_TABLET | Freq: Two times a day (BID) | ORAL | Status: DC

## 2014-04-17 NOTE — ED Notes (Signed)
Pain lt wrist, for 3-4 weeks, no known injury , job causes repetitive use of wrist

## 2014-04-17 NOTE — Discharge Instructions (Signed)
Tendinitis ° Tendinitis is redness, soreness, and puffiness (inflammation) of the tendons. Tendons are band-like tissues that connect muscle to bone. Tendinitis often happens in the shoulders, heels, or elbows. It might happen if your job involves doing the same motions over and over. °HOME CARE °· Use a sling or splint as told by your doctor. °· Put ice on the injured area. °¨ Put ice in a plastic bag. °¨ Place a towel between your skin and the bag. °¨ Leave the ice on for 15-20 minutes, 03-04 times a day. °· Avoid using your injured arm or leg until the pain goes away. °· Do gentle exercises only as told by your doctor. Stop exercises if the pain gets worse, unless your doctor tells you otherwise. °· Only take medicines as told by your doctor. °GET HELP RIGHT AWAY IF: °· Your pain and puffiness get worse. °· You have new problems, such as loss of feeling (numbness) in the hands. °MAKE SURE YOU: °· Understand these instructions. °· Will watch your condition. °· Will get help right away if you are not doing well or get worse. °Document Released: 04/01/2010 Document Revised: 03/14/2011 Document Reviewed: 04/01/2010 °ExitCare® Patient Information ©2015 [employer]. This information is not intended to replace advice given to you by your health care provider. Make sure you discuss any questions you have with your health care provider. ° °

## 2014-04-20 NOTE — ED Provider Notes (Signed)
CSN: [id number]     Arrival date & time 04/17/14  1300 History   First MD Initiated Contact with Patient 04/17/14 1410     Chief Complaint  Patient presents with  . Wrist Pain     (Consider location/radiation/quality/duration/timing/severity/associated sxs/prior Treatment) HPI  [name] Taylor is a 29 y.o. female who presents to the Emergency Department complaining of left wrist pain for 3-4 weeks.  She describes pain to the wrist that's worse with movement and states she does a a lot of repetitive movements at her job.  She states the pain will improve slightly with rest, but pain returns.  She has not tried any therapies.  She denies numbness or weakness of her fingers, elbow pain, redness or known injury.  Past Medical History  Diagnosis Date  . Chronic headaches   . Chronic leg pain     left lower leg  . Burning with urination 10/02/2013  . Hematuria 10/02/2013  . LLQ pain 10/02/2013  . Vaginal discharge 10/02/2013   History reviewed. No pertinent past surgical history. Family History  Problem Relation Age of Onset  . Other Maternal Grandmother     abnormal heart beat   History  Substance Use Topics  . Smoking status: Current Every Day Smoker -- 1.00 packs/day    Types: Cigarettes  . Smokeless tobacco: Never Used  . Alcohol Use: No   OB History    Gravida Para Term Preterm AB TAB SAB Ectopic Multiple Living   Review of Systems  Constitutional: Negative for fever and chills.  Musculoskeletal: Positive for joint swelling and arthralgias (left wrist pain).  Skin: Negative for color change and wound.  Neurological: Negative for weakness and numbness.  All other systems reviewed and are negative.     Allergies  Review of patient's allergies indicates no known allergies.  Home Medications   Prior to Admission medications   Medication Sig Start Date End Date Taking? Authorizing Provider  etonogestrel (IMPLANON) 68 MG IMPL implant Inject 1 each into  the skin once.     Yes Historical Provider, MD  cyclobenzaprine (FLEXERIL) 10 MG tablet Take 1 tablet (10 mg total) by mouth 3 (three) times daily as needed for muscle spasms. Patient not taking: Reported on 04/17/2014 01/29/14   Campbell Riches, NP  HYDROcodone-acetaminophen (NORCO/VICODIN) 5-325 MG per tablet Take one-two tabs po q 4-6 hrs prn pain 04/17/14   Monisha Siebel, PA-C  naproxen (NAPROSYN) 500 MG tablet Take 1 tablet (500 mg total) by mouth 2 (two) times daily with a meal. 04/17/14   Daquon Greenleaf, PA-C  promethazine (PHENERGAN) 25 MG tablet Take 1 tablet (25 mg total) by mouth every 6 (six) hours as needed for nausea or vomiting (or headache). Patient not taking: Reported on 04/17/2014 03/31/14   Samuel Jester, DO   BP 113/67 mmHg  Pulse 66  Temp(Src) 98.7 F (37.1 C) (Oral)  Resp 16  Ht  (1.549 m)  Wt 140 lb (63.504 kg)  BMI 26.47 kg/m2  SpO2 99% Physical Exam  Constitutional: She is oriented to person, place, and time. She appears well-developed and well-nourished. No distress.  HENT:  Head: Normocephalic and atraumatic.  Cardiovascular: Normal rate, regular rhythm and normal heart sounds.   Pulmonary/Chest: Effort normal and breath sounds normal. No respiratory distress.  Musculoskeletal: She exhibits tenderness. She exhibits no edema.  Tenderness of the left wrist. Mild STS of distal wrist. Radial pulse  is brisk, distal sensation intact.  CR< 2 sec.  No bruising or bony deformity.  Patient has full ROM. Compartments soft.  Neurological: She is alert and oriented to person, place, and time. She exhibits normal muscle tone. Coordination normal.  Skin: Skin is warm and dry.  Nursing note and vitals reviewed.   ED Course  Procedures (including critical care time) Labs Review Labs Reviewed - No data to display  Imaging Review Dg Wrist Complete Left  04/17/2014   CLINICAL DATA:  Left wrist pain and swelling for several weeks without known injury. Initial  encounter.  EXAM: LEFT WRIST - COMPLETE 3+ VIEW  COMPARISON:  None.  FINDINGS: There is no evidence of fracture or dislocation. There is no evidence of arthropathy or other focal bone abnormality. Soft tissues are unremarkable.  IMPRESSION: Normal left wrist.   Electronically Signed   By: Lupita RaiderJames  Green Jr, M.D.   On: 04/17/2014 14:13   Dg Hand Complete Left  04/17/2014   CLINICAL DATA:  Left hand pain and swelling for several weeks without known injury. Initial encounter.  EXAM: LEFT HAND - COMPLETE 3+ VIEW  COMPARISON:  None.  FINDINGS: There is no evidence of fracture or dislocation. There is no evidence of arthropathy or other focal bone abnormality. Soft tissues are unremarkable.  IMPRESSION: Normal left hand.   Electronically Signed   By: Lupita RaiderJames  Green Jr, M.D.   On: 04/17/2014 14:16      EKG Interpretation None      MDM   Final diagnoses:  Tendonitis of wrist, left    Wrist splint applied, pain improved,  Remains NV intact.  Tendonitis of the wrist is likely related to inflammatory process secondary to repetitive use.  No clinical suspicion for infectious process.  Pt agrees to symptomatic tx and ortho f/u if not improving   Pauline Ausammy Jailen Lung, PA-C 04/20/14 1841  Bethann BerkshireJoseph Zammit, MD 04/21/14 [phone]

## 2014-08-07 ENCOUNTER — Emergency Department (HOSPITAL_COMMUNITY)
Admission: EM | Admit: 2014-08-07 | Discharge: 2014-08-07 | Disposition: A | Discharge status: Home / Self Care | Payer: BLUE CROSS/BLUE SHIELD | Attending: Emergency Medicine | Admitting: Emergency Medicine

## 2014-08-07 ENCOUNTER — Encounter (HOSPITAL_COMMUNITY): Payer: Self-pay

## 2014-08-07 DIAGNOSIS — K088 Other specified disorders of teeth and supporting structures: Secondary | ICD-10-CM | POA: Insufficient documentation

## 2014-08-07 DIAGNOSIS — Z8742 Personal history of other diseases of the female genital tract: Secondary | ICD-10-CM | POA: Diagnosis not present

## 2014-08-07 DIAGNOSIS — K0889 Other specified disorders of teeth and supporting structures: Secondary | ICD-10-CM

## 2014-08-07 DIAGNOSIS — Z791 Long term (current) use of non-steroidal anti-inflammatories (NSAID): Secondary | ICD-10-CM | POA: Diagnosis not present

## 2014-08-07 DIAGNOSIS — G8929 Other chronic pain: Secondary | ICD-10-CM | POA: Diagnosis not present

## 2014-08-07 DIAGNOSIS — Z720 Tobacco use: Secondary | ICD-10-CM | POA: Insufficient documentation

## 2014-08-07 MED ORDER — KETOROLAC TROMETHAMINE 10 MG PO TABS: 10.0000 mg | ORAL_TABLET | Freq: Four times a day (QID) | ORAL | Status: DC | PRN

## 2014-08-07 MED ORDER — PENICILLIN V POTASSIUM 500 MG PO TABS: 500.0000 mg | ORAL_TABLET | Freq: Four times a day (QID) | ORAL | Status: DC

## 2014-08-07 MED ORDER — KETOROLAC TROMETHAMINE 10 MG PO TABS
10.0000 mg | ORAL_TABLET | Freq: Once | ORAL | Status: AC
  Administered 2014-08-07: 10 mg via ORAL
  Filled 2014-08-07: qty 1

## 2014-08-07 NOTE — ED Notes (Signed)
Right lower dental pain, taking ibuprofen without relief.

## 2014-08-07 NOTE — Discharge Instructions (Signed)
Dental Pain °A tooth ache may be caused by cavities (tooth decay). Cavities expose the nerve of the tooth to air and hot or cold temperatures. It may come from an infection or abscess (also called a boil or furuncle) around your tooth. It is also often caused by dental caries (tooth decay). This causes the pain you are having. °DIAGNOSIS  °Your caregiver can diagnose this problem by exam. °TREATMENT  °· If caused by an infection, it may be treated with medications which kill germs (antibiotics) and pain medications as prescribed by your caregiver. Take medications as directed. °· Only take over-the-counter or prescription medicines for pain, discomfort, or fever as directed by your caregiver. °· Whether the tooth ache today is caused by infection or dental disease, you should see your dentist as soon as possible for further care. °SEEK MEDICAL CARE IF: °The exam and treatment you received today has been provided on an emergency basis only. This is not a substitute for complete medical or dental care. If your problem worsens or new problems (symptoms) appear, and you are unable to meet with your dentist, call or return to this location. °SEEK IMMEDIATE MEDICAL CARE IF:  °· You have a fever. °· You develop redness and swelling of your face, jaw, or neck. °· You are unable to open your mouth. °· You have severe pain uncontrolled by pain medicine. °MAKE SURE YOU:  °· Understand these instructions. °· Will watch your condition. °· Will get help right away if you are not doing well or get worse. °Document Released: 12/20/2004 Document Revised: 03/14/2011 Document Reviewed: 08/08/2007 °ExitCare® Patient Information ©2015 [employer]. This information is not intended to replace advice given to you by your health care provider. Make sure you discuss any questions you have with your health care provider. ° °Dental Care and Dentist Visits °Dental care supports good overall health. Regular dental visits can also help you  avoid dental pain, bleeding, infection, and other more serious health problems in the future. It is important to keep the mouth healthy because diseases in the teeth, gums, and other oral tissues can spread to other areas of the body. Some problems, such as diabetes, heart disease, and pre-term labor have been associated with poor oral health.  °See your dentist every 6 months. If you experience emergency problems such as a toothache or broken tooth, go to the dentist right away. If you see your dentist regularly, you may catch problems early. It is easier to be treated for problems in the early stages.  °WHAT TO EXPECT AT A DENTIST VISIT  °Your dentist will look for many common oral health problems and recommend proper treatment. At your regular dental visit, you can expect: °· Gentle cleaning of the teeth and gums. This includes scraping and polishing. This helps to remove the sticky substance around the teeth and gums (plaque). Plaque forms in the mouth shortly after eating. Over time, plaque hardens on the teeth as tartar. If tartar is not removed regularly, it can cause problems. Cleaning also helps remove stains. °· Periodic X-rays. These pictures of the teeth and supporting bone will help your dentist assess the health of your teeth. °· Periodic fluoride treatments. Fluoride is a natural mineral shown to help strengthen teeth. Fluoride treatment involves applying a fluoride gel or varnish to the teeth. It is most commonly done in children. °· Examination of the mouth, tongue, jaws, teeth, and gums to look for any oral health problems, such as: °¨ Cavities (dental caries). This is   decay on the tooth caused by plaque, sugar, and acid in the mouth. It is best to catch a cavity when it is small. °¨ Inflammation of the gums caused by plaque buildup (gingivitis). °¨ Problems with the mouth or malformed or misaligned teeth. °¨ Oral cancer or other diseases of the soft tissues or jaws.  °KEEP YOUR TEETH AND GUMS  HEALTHY °For healthy teeth and gums, follow these general guidelines as well as your dentist's specific advice: °· Have your teeth professionally cleaned at the dentist every 6 months. °· Brush twice daily with a fluoride toothpaste. °· Floss your teeth daily.  °· Ask your dentist if you need fluoride supplements, treatments, or fluoride toothpaste. °· Eat a healthy diet. Reduce foods and drinks with added sugar. °· Avoid smoking. °TREATMENT FOR ORAL HEALTH PROBLEMS °If you have oral health problems, treatment varies depending on the conditions present in your teeth and gums. °· Your caregiver will most likely recommend good oral hygiene at each visit. °· For cavities, gingivitis, or other oral health disease, your caregiver will perform a procedure to treat the problem. This is typically done at a separate appointment. Sometimes your caregiver will refer you to another dental specialist for specific tooth problems or for surgery. °SEEK IMMEDIATE DENTAL CARE IF: °· You have pain, bleeding, or soreness in the gum, tooth, jaw, or mouth area. °· A permanent tooth becomes loose or separated from the gum socket. °· You experience a blow or injury to the mouth or jaw area. °Document Released: 09/01/2010 Document Revised: 03/14/2011 Document Reviewed: 09/01/2010 °ExitCare® Patient Information ©2015 [employer]. This information is not intended to replace advice given to you by your health care provider. Make sure you discuss any questions you have with your health care provider. ° ° ° °Emergency Department Resource Guide °1) Find a Doctor and Pay Out of Pocket °Although you won't have to find out who is covered by your insurance plan, it is a good idea to ask around and get recommendations. You will then need to call the office and see if the doctor you have chosen will accept you as a new patient and what types of options they offer for patients who are self-pay. Some doctors offer discounts or will set up payment plans  for their patients who do not have insurance, but you will need to ask so you aren't surprised when you get to your appointment. ° °2) Contact Your Local Health Department °Not all health departments have doctors that can see patients for sick visits, but many do, so it is worth a call to see if yours does. If you don't know where your local health department is, you can check in your phone book. The CDC also has a tool to help you locate your state's health department, and many state websites also have listings of all of their local health departments. ° °3) Find a Walk-in Clinic °If your illness is not likely to be very severe or complicated, you may want to try a walk in clinic. These are popping up all over the country in pharmacies, drugstores, and shopping centers. They're usually staffed by nurse practitioners or physician assistants that have been trained to treat common illnesses and complaints. They're usually fairly quick and inexpensive. However, if you have serious medical issues or chronic medical problems, these are probably not your best option. ° °No Primary Care Doctor: °- Call [facility] at  832-8000 - they can help you locate a primary care doctor that  accepts   your insurance, provides certain services, etc. °- Physician Referral Service- 1-800-533-3463 ° °Chronic Pain Problems: °Organization         Address  Phone   Notes  °[facility]  (336) 297-2271 Patients need to be referred by their primary care doctor.  ° °Medication Assistance: °Organization         Address  Phone   Notes  °[place] [facility] [address] °[place], [place] [address] (336) 641-8030 --Must be a resident of [place] °-- Must have NO insurance coverage whatsoever (no Medicaid/ Medicare, etc.) °-- The pt. MUST have a primary care doctor that directs their care regularly and follows them in the community °  °[facility]  (866) 331-1348   °[employer]  (888)  892-1162   ° °Agencies that provide inexpensive medical care: °Organization         Address  Phone   Notes  °[facility]  (336) 832-8035   °[facility]    (336) 832-7272   °[facility] [address] °[place], [place] [address] (336) 832-4777   °[facility] [address], °[place] (336) 271-4999   °[facility]    (336) 373-0678   °[facility]    (336) 272-1050   °[facility] ° [address], [place] Phone:  (336) 832-4444, Fax:  (336) 832-4440 Hours of Operation:  9 am - 6 pm, M-F.  Also accepts Medicaid/Medicare and self-pay.  °[facility] ° [address], [place] Phone: (336) 832-3150, Fax: (336) 832-3151. Hours of Operation:  8:30 am - 5:30 pm, M-F.  Also accepts Medicaid and self-pay.  °[facility] [place] [address], [place] Phone: (336) 878-6027   °[facility] [address], [place], [place] (336)723-1848, Ext. 123 Mondays & Thursdays: 7-9 AM.  First 15 patients are seen on a first come, first serve basis. °  ° °Medicaid-accepting [place] Providers: ° °Organization         Address  Phone   Notes  °[facility] [address], [place] (336) 641-2100 Also accepts self-pay patients.  °[facility] [address], [place] ° (336) 856-9996   °[facility] [address], [place] (336) 288-8857   °[facility] [address] [place] [address], [place] (336) 299-7000   °Veita Bland [address], [place]  ° (336) 373-1557 Only accepts [place] Access Medicaid patients after they have their name applied to their card.  ° °Self-Pay (no insurance) in [place]: ° °Organization         Address  Phone   Notes  °Sickle Cell Patients, [facility] [address], [place] (336)  832-1970   °[facility] [address], [place] (336) 832-4400   °[facility] ° [address], [place] (336) 992-4800   °[facility]/Dr. Osei-Bonsu ° [address] [place] [address], [place] or [address], [place] (336) 841-8500 Phone number for both [place] and [place] locations is the same.  °[facility] [address], [place] (336) 299-0000   °[facility] [address] [place] [address],  or [address] (336) 852-7530 °(336) 878-2260   °[facility]   [address], [place] (336) 350-1642, phone; (336) 294-5005, fax Sees patients 1st and 3rd Saturday of every month.  Must not qualify for public or private insurance (i.e. Medicaid, Medicare, [place] Health Choice, Veterans' Benefits) • Household income should be no more than 200% of the poverty level •The clinic cannot treat you if you are pregnant or think you are pregnant • Sexually transmitted diseases are not treated at the clinic.  ° ° °Dental Care: °Organization         Address  Phone  Notes  °[facility] [address], [place] (336) 641-6152 Accepts children up to age 21 who are enrolled in Medicaid or [place] Health Choice; pregnant women with a Medicaid card; and children who have applied for Medicaid or [place] Health Choice, but were declined, whose parents can pay a reduced fee at time of service.  °[facility] [place]  [address], [place] (336) 641-7733 Accepts children up to age 21 who are enrolled in Medicaid or [place] Health Choice; pregnant women with a Medicaid card; and children who have applied for Medicaid or [place] Health Choice, but were declined, whose parents can pay a reduced fee at time of service.  °[facility] ° [address], [place] (336) 641-4533 Patients are  seen by appointment only. Walk-ins are not accepted. [facility] will see patients 18 years of age and older. °Monday - Tuesday (8am-5pm) °Most Wednesdays (8:30-5pm) °$30 per visit, cash only  °[facility] ° [address], [place] (336) 641-4533 Patients are seen by appointment only. Walk-ins are not accepted. [facility] will see patients 18 years of age and older. °One Wednesday Evening (Monthly: Volunteer Based).  $30 per visit, cash only  °[facility]  (919) 537-3737 for adults; Children under age 4, call Graduate Pediatric Dentistry at (919) [phone]. Children aged 4-14, please call (919) [phone] to request a pediatric application. ° Dental services are provided in all areas of dental care including fillings, crowns and bridges, complete and partial dentures, implants, gum treatment, root canals, and extractions. Preventive care is also provided. Treatment is provided to both adults and children. °Patients are selected via a lottery and there is often a waiting list. °  °[facility] [address], °[place] ° (336) 763-8833 [email or web address] °  °[facility] [address], [place], [place] (336)723-1848, Ext. 123 Second and Fourth Thursday of each month, opens at 6:30 AM; Clinic ends at 9 AM.  Patients are seen on a first-come first-served basis, and a limited number are seen during each clinic.  ° °[facility] ° [address] [place] [address], [place],  (336) 723-7904   Eligibility Requirements °You must have lived in [place], [place], or [place] counties for at least the last three months. °  You cannot be eligible for state or federal sponsored healthcare insurance, including Veterans Administration, Medicaid, or Medicare. °  You generally cannot be eligible for healthcare insurance through your employer.  °  How to apply: °Eligibility screenings are held every Tuesday and Wednesday afternoon from 1:00 pm until 4:00  pm. You do not need an appointment for the interview!  °[facility] [address], [place],  336-631-2330   °[facility]  336-342-8273   °[facility]  336-703-3100   °[facility]  336-570-6415   ° °  Behavioral Health Resources in the Community: °Intensive Outpatient Programs °Organization         Address  Phone  Notes  °[facility] [address], [place], [place] 336-878-6098   °[facility] [address], [place], [place] 336-832-9800   °[facility] [address], [place], [place] ° 336-882-2125   °[facility] [address],  °[place], [place] 1-800-853-5163 or 336-641-4981   °Substance Abuse Resources °Organization         Address  Phone  Notes  °[facility]  336-882-2125   °[facility]  336-784-9470   °[facility]  336-285-9073   °[facility]  336-845-3988   °Residential & Outpatient Substance Abuse Program  1-800-659-3381   °Psychological Services °Organization         Address  Phone  Notes  ° Health  336- 832-9600   °[facility]  336- 378-7881   °[facility] [address], [place] 1-800-853-5163 or 336-641-4981   ° °Mobile Crisis Teams °Organization         Address  Phone  Notes  °[facility], Mobile Crisis Care Unit  1-877-626-1772   °Assertive °[facility] ° [address] [place], [place] 336-834-9664   °Sharon DeEsch [address] °[place] 336-554-5454   ° °Self-Help/Support Groups °Organization         Address  Phone             Notes  °[facility] - variety of support groups  336- 373-1402 Call for more information  °Narcotics Anonymous (NA), [facility] [address], °[place]  2 meetings at this location  ° °Residential Treatment Programs °Organization          Address  Phone  Notes  °[facility] [address],    °[place]  1-866-801-8205   °[facility] ° [address], [place], [place] 704-293-8524   °[facility] [address], [place] 336-845-3988 Admissions: 8am-3pm M-F  °[facility] [address]    °[place], [place] 336-841-1104   °[facility] [address], [place], [place] 336-379-7146   °[facility] [address]  °[place], [place] 336-285-9073   °[facility] Intensive Outpatient [address], [place], [place] 336-852-3033   °[facility] [address]  °[place], [place] 1-877-615-2722 or 336-784-9470   °[facility] [address] [place], [place] 336-227-7417 Accepts Medicaid  °[facility] [address]  ° [place] 1-800-659-3381 Substance Abuse/Addiction Treatment  ° °[place] Behavioral Health Resources °Organization         Address  Phone  Notes  °[facility]  (888) 581-9988   °Julie Brannon, PhD [address] [place], [place]   (336) 349-5553 or (336) 951-0000   °[facility]   [address] °[place], [place] (336) 349-4454   °[facility] [address], [place], [place] (336) 342-8316 Insurance/Medicaid/sponsorship through [facility]  °[facility] [address]                                    [place], [place] (336) 342-8316 Therapy/tele-psych/case  °[facility] [address]  ° [place],   [place] (336) 349-2233    °Dr. Arfeen  (336) 349-4544   °[facility] [place]  [employer] [place] [facility] 1) [address], [place] °2) [address], [place] °3)  [address], [place] (336) 349-3220 °(336) 342-7768 ° °(336) 342-8140   °[place] [facility] (336) 342-1394 or (336) 342-3537 (After Hours)    ° ° ° °

## 2014-08-07 NOTE — ED Provider Notes (Signed)
CSN: [id number]     Arrival date & time 08/07/14  0016 History   First MD Initiated Contact with Patient 08/07/14 0055     Chief Complaint  Patient presents with  . Dental Pain     (Consider location/radiation/quality/duration/timing/severity/associated sxs/prior Treatment) Patient is a 29 y.o. female presenting with tooth pain. The history is provided by the patient.  Dental Pain Location:  Lower Lower teeth location:  27/RL cuspid Quality:  Sharp and shooting Severity:  Moderate Onset quality: began as dull pain a month ago but worsened to a a sharp shooting pain yesterday. Duration:  2 days Timing:  Constant Progression:  Worsening Chronicity:  New Context: dental caries and poor dentition   Context: not abscess, cap still on, not crown fracture, not dental fracture, not enamel fracture, filling still in place, not intrusion, not malocclusion, not recent dental surgery and not trauma   Previous work-up:  Dental exam Relieved by: swishing with warm water. Worsened by:  Cold food/drink Ineffective treatments:  Acetaminophen and NSAIDs Associated symptoms: no congestion, no difficulty swallowing, no drooling, no facial pain, no facial swelling, no fever, no gum swelling, no headaches, no neck pain, no neck swelling, no oral bleeding, no oral lesions and no trismus   Risk factors: lack of dental care and smoking   Risk factors: no cancer, no chewing tobacco use, no diabetes and no immunosuppression     Past Medical History  Diagnosis Date  . Chronic headaches   . Chronic leg pain     left lower leg  . Burning with urination 10/02/2013  . Hematuria 10/02/2013  . LLQ pain 10/02/2013  . Vaginal discharge 10/02/2013   History reviewed. No pertinent past surgical history. Family History  Problem Relation Age of Onset  . Other Maternal Grandmother     abnormal heart beat   History  Substance Use Topics  . Smoking status: Current Every Day Smoker -- 1.00 packs/day    Types:  Cigarettes  . Smokeless tobacco: Never Used  . Alcohol Use: No   OB History    Gravida Para Term Preterm AB TAB SAB Ectopic Multiple Living   3 3  1            Review of Systems  Constitutional: Negative for fever, chills and diaphoresis.  HENT: Positive for dental problem. Negative for congestion, drooling, facial swelling, mouth sores, sore throat and trouble swallowing.   Musculoskeletal: Negative for neck pain.  Neurological: Negative for headaches.  All other systems reviewed and are negative.     Allergies  Review of patient's allergies indicates no known allergies.  Home Medications   Prior to Admission medications   Medication Sig Start Date End Date Taking? Authorizing Provider  etonogestrel (IMPLANON) 68 MG IMPL implant Inject 1 each into the skin once.     Yes Historical Provider, MD  ibuprofen (ADVIL,MOTRIN) 800 MG tablet Take 800 mg by mouth every 8 (eight) hours as needed.   Yes Historical Provider, MD  cyclobenzaprine (FLEXERIL) 10 MG tablet Take 1 tablet (10 mg total) by mouth 3 (three) times daily as needed for muscle spasms. Patient not taking: Reported on 04/17/2014 01/29/14   Campbell Riches, NP  HYDROcodone-acetaminophen (NORCO/VICODIN) 5-325 MG per tablet Take one-two tabs po q 4-6 hrs prn pain 04/17/14   Tammy Triplett, PA-C  naproxen (NAPROSYN) 500 MG tablet Take 1 tablet (500 mg total) by mouth 2 (two) times daily with a meal. 04/17/14   Tammy Triplett, PA-C  promethazine (PHENERGAN) 25  MG tablet Take 1 tablet (25 mg total) by mouth every 6 (six) hours as needed for nausea or vomiting (or headache). Patient not taking: Reported on 04/17/2014 03/31/14   Samuel Jester, DO   BP 127/83 mmHg  Pulse 60  Resp 16  Ht  (1.549 m)  Wt 150 lb (68.04 kg)  BMI 28.36 kg/m2  SpO2 100% Physical Exam  Constitutional: She is oriented to person, place, and time. She appears well-developed and well-nourished. No distress.  HENT:  Head: Normocephalic and atraumatic.   Oral mucosa non erythematous. No sign of bleeding or abscess. Poor dentition.   Eyes: Conjunctivae and EOM are normal.  Cardiovascular: Normal rate, regular rhythm and normal heart sounds.  Exam reveals no gallop and no friction rub.   No murmur heard. Pulmonary/Chest: Effort normal and breath sounds normal.  Lymphadenopathy:    She has no cervical adenopathy.  Neurological: She is alert and oriented to person, place, and time.  Skin: She is not diaphoretic.  Vitals reviewed.   ED Course  Procedures (including critical care time) Labs Review Labs Reviewed - No data to display  Imaging Review No results found.    MDM   Final diagnoses:  Pain, dental    No trismus or torticollis, nsaids and home, no signs of Ludwig's.  Pt expresses understanding.  Dental f/u list given  Meds given in ED:  Medications  ketorolac (TORADOL) tablet 10 mg (10 mg Oral Given 08/07/14 0143)    Discharge Medication List as of 08/07/2014  1:29 AM    START taking these medications   Details  ketorolac (TORADOL) 10 MG tablet Take 1 tablet (10 mg total) by mouth every 6 (six) hours as needed., Starting 08/07/2014, Until Discontinued, Print    penicillin v potassium (VEETID) 500 MG tablet Take 1 tablet (500 mg total) by mouth 4 (four) times daily., Starting 08/07/2014, Until Discontinued, Print            Eber Hong, MD 08/08/14 [phone]

## 2014-09-10 ENCOUNTER — Emergency Department (HOSPITAL_COMMUNITY)
Admission: EM | Admit: 2014-09-10 | Discharge: 2014-09-10 | Disposition: A | Discharge status: Home / Self Care | Payer: BLUE CROSS/BLUE SHIELD | Attending: Emergency Medicine | Admitting: Emergency Medicine

## 2014-09-10 ENCOUNTER — Encounter (HOSPITAL_COMMUNITY): Payer: Self-pay | Admitting: *Deleted

## 2014-09-10 DIAGNOSIS — Z720 Tobacco use: Secondary | ICD-10-CM | POA: Diagnosis not present

## 2014-09-10 DIAGNOSIS — Z792 Long term (current) use of antibiotics: Secondary | ICD-10-CM | POA: Insufficient documentation

## 2014-09-10 DIAGNOSIS — G8929 Other chronic pain: Secondary | ICD-10-CM | POA: Insufficient documentation

## 2014-09-10 DIAGNOSIS — K029 Dental caries, unspecified: Secondary | ICD-10-CM | POA: Diagnosis not present

## 2014-09-10 DIAGNOSIS — Z79899 Other long term (current) drug therapy: Secondary | ICD-10-CM | POA: Diagnosis not present

## 2014-09-10 DIAGNOSIS — Z8742 Personal history of other diseases of the female genital tract: Secondary | ICD-10-CM | POA: Diagnosis not present

## 2014-09-10 DIAGNOSIS — Z791 Long term (current) use of non-steroidal anti-inflammatories (NSAID): Secondary | ICD-10-CM | POA: Insufficient documentation

## 2014-09-10 DIAGNOSIS — K0889 Other specified disorders of teeth and supporting structures: Secondary | ICD-10-CM

## 2014-09-10 DIAGNOSIS — K088 Other specified disorders of teeth and supporting structures: Secondary | ICD-10-CM | POA: Insufficient documentation

## 2014-09-10 MED ORDER — PENICILLIN V POTASSIUM 500 MG PO TABS: 500.0000 mg | ORAL_TABLET | Freq: Four times a day (QID) | ORAL | Status: DC

## 2014-09-10 MED ORDER — DICLOFENAC SODIUM 75 MG PO TBEC: 75.0000 mg | DELAYED_RELEASE_TABLET | Freq: Two times a day (BID) | ORAL | Status: DC

## 2014-09-10 NOTE — ED Notes (Signed)
Dental pain began last night

## 2014-09-10 NOTE — ED Provider Notes (Signed)
CSN: [id number]     Arrival date & time 09/10/14  0751 History   First MD Initiated Contact with Patient 09/10/14 [phone]     Chief Complaint  Patient presents with  . Dental Pain     (Consider location/radiation/quality/duration/timing/severity/associated sxs/prior Treatment) HPI   [name] Taylor is a 29 y.o. female who presents to the Emergency Department complaining of dental pain.  She was seen here last month for same and reports pain improved temporarily, but returned yesterday.  She describes a throbbing sensation to her right tooth that radiates into her right face and up to her ear.  Pain is worse with chewing, heat and excessive cold.  she has not followed-up with a dentist stating that she doesn't have the money to get the tooth fixed.  She denies neck pain, fever, facial swelling, or difficulty opening or closing her mouth.  She has tried ibuprofen without relief.    Past Medical History  Diagnosis Date  . Chronic headaches   . Chronic leg pain     left lower leg  . Burning with urination 10/02/2013  . Hematuria 10/02/2013  . LLQ pain 10/02/2013  . Vaginal discharge 10/02/2013   History reviewed. No pertinent past surgical history. Family History  Problem Relation Age of Onset  . Other Maternal Grandmother     abnormal heart beat   Social History  Substance Use Topics  . Smoking status: Current Every Day Smoker -- 1.00 packs/day    Types: Cigarettes  . Smokeless tobacco: Never Used  . Alcohol Use: No   OB History    Gravida Para Term Preterm AB TAB SAB Ectopic Multiple Living   3 3  1            Review of Systems  Constitutional: Negative for fever and appetite change.  HENT: Positive for dental problem. Negative for congestion, facial swelling, sore throat and trouble swallowing.   Eyes: Negative for pain and visual disturbance.  Musculoskeletal: Negative for neck pain and neck stiffness.  Neurological: Negative for dizziness, facial asymmetry and headaches.   Hematological: Negative for adenopathy.  All other systems reviewed and are negative.     Allergies  Review of patient's allergies indicates no known allergies.  Home Medications   Prior to Admission medications   Medication Sig Start Date End Date Taking? Authorizing Provider  cyclobenzaprine (FLEXERIL) 10 MG tablet Take 1 tablet (10 mg total) by mouth 3 (three) times daily as needed for muscle spasms. Patient not taking: Reported on 04/17/2014 01/29/14   Campbell Riches, NP  etonogestrel (IMPLANON) 68 MG IMPL implant Inject 1 each into the skin once.      Historical Provider, MD  HYDROcodone-acetaminophen (NORCO/VICODIN) 5-325 MG per tablet Take one-two tabs po q 4-6 hrs prn pain 04/17/14   Lory Nowaczyk, PA-C  ibuprofen (ADVIL,MOTRIN) 800 MG tablet Take 800 mg by mouth every 8 (eight) hours as needed.    Historical Provider, MD  ketorolac (TORADOL) 10 MG tablet Take 1 tablet (10 mg total) by mouth every 6 (six) hours as needed. 08/07/14   Eber Hong, MD  naproxen (NAPROSYN) 500 MG tablet Take 1 tablet (500 mg total) by mouth 2 (two) times daily with a meal. 04/17/14   Ying Blankenhorn, PA-C  penicillin v potassium (VEETID) 500 MG tablet Take 1 tablet (500 mg total) by mouth 4 (four) times daily. 08/07/14   Eber Hong, MD  promethazine (PHENERGAN) 25 MG tablet Take 1 tablet (25 mg total) by mouth every 6 (  six) hours as needed for nausea or vomiting (or headache). Patient not taking: Reported on 04/17/2014 03/31/14   Samuel Jester, DO   BP 132/73 mmHg  Pulse 62  Temp(Src) 97.9 F (36.6 C) (Oral)  Resp 16  Ht  (1.549 m)  Wt 150 lb (68.04 kg)  BMI 28.36 kg/m2  SpO2 100% Physical Exam  Constitutional: She is oriented to person, place, and time. She appears well-developed and well-nourished. No distress.  HENT:  Head: Normocephalic and atraumatic.  Right Ear: Tympanic membrane and ear canal normal.  Left Ear: Tympanic membrane and ear canal normal.  Mouth/Throat: Uvula is  midline, oropharynx is clear and moist and mucous membranes are normal. No trismus in the jaw. Dental caries present. No dental abscesses or uvula swelling.  Tenderness and dental caries of #27 cuspid.  No facial swelling, obvious dental abscess, trismus, or sublingual abnml.  Poor dentition, multiple caries  Neck: Normal range of motion. Neck supple.  Cardiovascular: Normal rate, regular rhythm and normal heart sounds.   No murmur heard. Pulmonary/Chest: Effort normal and breath sounds normal.  Musculoskeletal: Normal range of motion.  Lymphadenopathy:    She has no cervical adenopathy.  Neurological: She is alert and oriented to person, place, and time. She exhibits normal muscle tone. Coordination normal.  Skin: Skin is warm and dry.  Nursing note and vitals reviewed.   ED Course  Procedures (including critical care time) Labs Review Labs Reviewed - No data to display  Imaging Review No results found. I have personally reviewed and evaluated these images and lab results as part of my medical decision-making.   EKG Interpretation None      MDM   Final diagnoses:  Pain, dental    Pt well appearing, vital stable.  No concerning sx's for Ludwig's angina or periapical abscess.  Here last month for same.  Has not seen a dentist.  Given referral info.  rx for Pen VK and diclofenac    Pauline Aus, PA-C 09/10/14 1610  Gilda Crease, MD 09/10/14 [phone]

## 2014-09-10 NOTE — Discharge Instructions (Signed)
Dental Pain  Toothache is pain in or around a tooth. It may get worse with chewing or with cold or heat.   HOME CARE  · Your dentist may use a numbing medicine during treatment. If so, you may need to avoid eating until the medicine wears off. Ask your dentist about this.  · Only take medicine as told by your dentist or doctor.  · Avoid chewing food near the painful tooth until after all treatment is done. Ask your dentist about this.  GET HELP RIGHT AWAY IF:   · The problem gets worse or new problems appear.  · You have a fever.  · There is redness and puffiness (swelling) of the face, jaw, or neck.  · You cannot open your mouth.  · There is pain in the jaw.  · There is very bad pain that is not helped by medicine.  MAKE SURE YOU:   · Understand these instructions.  · Will watch your condition.  · Will get help right away if you are not doing well or get worse.  Document Released: 06/08/2007 Document Revised: 03/14/2011 Document Reviewed: 06/08/2007  Exit[employer]® Patient Information ©2015 [employer]. This information is not intended to replace advice given to you by your health care provider. Make sure you discuss any questions you have with your health care provider.

## 2014-10-01 ENCOUNTER — Ambulatory Visit: Payer: BLUE CROSS/BLUE SHIELD | Admitting: Family Medicine

## 2015-04-12 ENCOUNTER — Encounter (HOSPITAL_COMMUNITY): Payer: Self-pay

## 2015-04-12 ENCOUNTER — Emergency Department (HOSPITAL_COMMUNITY)
Admission: EM | Admit: 2015-04-12 | Discharge: 2015-04-12 | Disposition: A | Discharge status: Home / Self Care | Payer: BLUE CROSS/BLUE SHIELD | Attending: Emergency Medicine | Admitting: Emergency Medicine

## 2015-04-12 DIAGNOSIS — L03011 Cellulitis of right finger: Secondary | ICD-10-CM | POA: Diagnosis not present

## 2015-04-12 DIAGNOSIS — M79644 Pain in right finger(s): Secondary | ICD-10-CM | POA: Diagnosis present

## 2015-04-12 DIAGNOSIS — F17210 Nicotine dependence, cigarettes, uncomplicated: Secondary | ICD-10-CM | POA: Diagnosis not present

## 2015-04-12 MED ORDER — IBUPROFEN 800 MG PO TABS
800.0000 mg | ORAL_TABLET | Freq: Once | ORAL | Status: AC
  Administered 2015-04-12: 800 mg via ORAL
  Filled 2015-04-12: qty 1

## 2015-04-12 MED ORDER — CEPHALEXIN 500 MG PO CAPS: 500.0000 mg | ORAL_CAPSULE | Freq: Four times a day (QID) | ORAL | Status: DC

## 2015-04-12 MED ORDER — BACITRACIN ZINC 500 UNIT/GM EX OINT
TOPICAL_OINTMENT | Freq: Once | CUTANEOUS | Status: AC
  Administered 2015-04-12: 02:00:00 via TOPICAL
  Filled 2015-04-12: qty 0.9

## 2015-04-12 MED ORDER — POVIDONE-IODINE 10 % EX SOLN
CUTANEOUS | Status: AC
  Administered 2015-04-12: 02:00:00
  Filled 2015-04-12: qty 118

## 2015-04-12 MED ORDER — IBUPROFEN 800 MG PO TABS: 800.0000 mg | ORAL_TABLET | Freq: Three times a day (TID) | ORAL | Status: DC | PRN

## 2015-04-12 MED ORDER — LIDOCAINE HCL (PF) 1 % IJ SOLN
5.0000 mL | Freq: Once | INTRAMUSCULAR | Status: AC
  Administered 2015-04-12: 5 mL via INTRADERMAL
  Filled 2015-04-12: qty 5

## 2015-04-12 MED ORDER — CEPHALEXIN 500 MG PO CAPS
500.0000 mg | ORAL_CAPSULE | Freq: Once | ORAL | Status: AC
  Administered 2015-04-12: 500 mg via ORAL
  Filled 2015-04-12: qty 1

## 2015-04-12 NOTE — ED Provider Notes (Signed)
By signing my name below, I, [facility] Health[facility]arrissa Washington, attest that this documentation has been prepared under the direction and in the presence of [employer] EnergyKristen N Adeoluwa Silvers, DO. Electronically Signed: Randell PatientMarrissa Washington, ED Scribe. 04/12/2015. 12:48 AM.  TIME SEEN: 12:43 AM  CHIEF COMPLAINT: Right thumb pain  HPI: [name] Taylor is a 30 y.o. female who presents to the Emergency Department complaining of constant, moderate right thumb pain onset 3 days ago, worse today. Patient reports associated swelling in the same area. Pain worse with movement. Denies recent injuries. Denies hx of DM. Denies fevers, vomiting, diarrhea. NKDA.  ROS: See HPI Constitutional: no fever  Eyes: no drainage  ENT: no runny nose   Cardiovascular:  no chest pain  Resp: no SOB  GI: no vomiting GU: no dysuria Integumentary: no rash  Allergy: no hives  Musculoskeletal: no leg swelling  Neurological: no slurred speech ROS otherwise negative  PAST MEDICAL HISTORY/PAST SURGICAL HISTORY:  Past Medical History  Diagnosis Date  . Chronic headaches   . Chronic leg pain     left lower leg  . Burning with urination 10/02/2013  . Hematuria 10/02/2013  . LLQ pain 10/02/2013  . Vaginal discharge 10/02/2013    MEDICATIONS:  Prior to Admission medications   Medication Sig Start Date End Date Taking? Authorizing Provider  diclofenac (VOLTAREN) 75 MG EC tablet Take 1 tablet (75 mg total) by mouth 2 (two) times daily. Take with food 09/10/14   Tammy Triplett, PA-C  etonogestrel (IMPLANON) 68 MG IMPL implant Inject 1 each into the skin once.      Historical Provider, MD  HYDROcodone-acetaminophen (NORCO/VICODIN) 5-325 MG per tablet Take one-two tabs po q 4-6 hrs prn pain 04/17/14   Tammy Triplett, PA-C  ibuprofen (ADVIL,MOTRIN) 800 MG tablet Take 800 mg by mouth every 8 (eight) hours as needed.    Historical Provider, MD  ketorolac (TORADOL) 10 MG tablet Take 1 tablet (10 mg total) by mouth every 6 (six) hours as needed. 08/07/14   Eber HongBrian Miller,  MD  naproxen (NAPROSYN) 500 MG tablet Take 1 tablet (500 mg total) by mouth 2 (two) times daily with a meal. 04/17/14   Tammy Triplett, PA-C  penicillin v potassium (VEETID) 500 MG tablet Take 1 tablet (500 mg total) by mouth 4 (four) times daily. For 7 days 09/10/14   Pauline Ausammy Triplett, PA-C    ALLERGIES:  No Known Allergies  SOCIAL HISTORY:  Social History  Substance Use Topics  . Smoking status: Current Every Day Smoker -- 1.00 packs/day    Types: Cigarettes  . Smokeless tobacco: Never Used  . Alcohol Use: No    FAMILY HISTORY: Family History  Problem Relation Age of Onset  . Other Maternal Grandmother     abnormal heart beat    EXAM: BP 121/74 mmHg  Pulse 52  Temp(Src) 98.2 F (36.8 C) (Oral)  Resp 16  Ht 5\' 1"  (1.549 m)  Wt 145 lb (65.772 kg)  BMI 27.41 kg/m2  SpO2 100% CONSTITUTIONAL: Alert and oriented and responds appropriately to questions. Well-appearing; well-nourished HEAD: Normocephalic EYES: Conjunctivae clear, PERRL ENT: normal nose; no rhinorrhea; moist mucous membranes NECK: Supple, no meningismus, no LAD  CARD: RRR; S1 and S2 appreciated; no murmurs, no clicks, no rubs, no gallops RESP: Normal chest excursion without splinting or tachypnea; breath sounds clear and equal bilaterally; no wheezes, no rhonchi, no rales, no hypoxia or respiratory distress, speaking full sentences ABD/GI: Normal bowel sounds; non-distended; soft, non-tender, no rebound, no guarding, no peritoneal signs BACK:  The back appears normal and is non-tender to palpation, there is no CVA tenderness EXT: Paronychia to radial aspect of right thumb With redness, warmth and fluctuance. There is no active drainage. Small amount of redness does go down to the DIP. There is no tenderness over the flexor tendon or swelling in this area. She has full range of motion in the thumb. 2+ radial pulse on the right. No bony injury.  Normal ROM in all joints; otherwise extremities are non-tender to palpation;  no edema; normal capillary refill; no cyanosis, no calf tenderness or swelling    SKIN: Normal color for age and race; warm; no rash.  NEURO: Moves all extremities equally, sensation to light touch intact diffusely, cranial nerves II through XII intact PSYCH: The patient's mood and manner are appropriate. Grooming and personal hygiene are appropriate.  MEDICAL DECISION MAKING: Patient here with a paronychia to the right finger. No sign of felon, herpetic whitlow, tenosynovitis, septic arthritis, fracture or dislocation.  Patient is otherwise well-appearing. She does have a small amount of redness streaks to the radial aspect of the DIP. Because of this we will start her on Keflex. Will give ibuprofen for pain.   Small amount of purulent drainage from paronychia after incision. Perform digital block. Patient tolerated procedure well. We'll discharge with Keflex, ibuprofen. 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    NERVE BLOCK Performed by: Raelyn Number Consent: Verbal consent obtained. Required items: required blood products, implants, devices, and special equipment available Time out: Immediately prior to procedure a "time out" was called to verify the correct patient, procedure, equipment, support staff and site/side marked as required.  Indication: Incision and drainage  Nerve block body site: Right thumb   Preparation: Patient was prepped and draped in the usual sterile fashion. Needle gauge: 24 G Location technique: anatomical landmarks  Local anesthetic: 1% lidocaine without epinephrine   Anesthetic total: 4 ml  Outcome: pain improved Patient tolerance: Patient tolerated the procedure well with no immediate complications.   INCISION AND DRAINAGE Performed by: Raelyn Number Consent: Verbal consent obtained. Risks and benefits: risks, benefits and alternatives were  discussed Type: abscess  Body area: Right radial thumb paronychia  Anesthesia: local infiltration  Incision was made with a scalpel.  Local anesthetic: lidocaine 1 % without epinephrine  Anesthetic total: 4 ml  Complexity: Simple   Drainage: purulent  Drainage amount: Small   Packing material: None   Patient tolerance: Patient tolerated the procedure well with no immediate complications.  I personally performed the services described in this documentation, which was scribed in my presence. The recorded information has been reviewed and is accurate.   Layla Maw Kamorah Nevils, DO 04/12/15 [phone]

## 2015-04-12 NOTE — ED Notes (Signed)
Having pain and swelling in my right thumb. Started 3 days ago. No known injury.

## 2015-04-12 NOTE — Discharge Instructions (Signed)
Paronychia °Paronychia is an infection of the skin that surrounds a nail. It usually affects the skin around a fingernail, but it may also occur near a toenail. It often causes pain and swelling around the nail. This condition may come on suddenly or develop over a longer period. In some cases, a collection of pus (abscess) can form near or under the nail. Usually, paronychia is not serious and it clears up with treatment. °CAUSES °This condition may be caused by bacteria or fungi. It is commonly caused by either Streptococcus or Staphylococcus bacteria. The bacteria or fungi often cause the infection by getting into the affected area through an opening in the skin, such as a cut or a hangnail. °RISK FACTORS °This condition is more likely to develop in: °· People who get their hands wet often, such as those who work as dishwashers, bartenders, or nurses. °· People who bite their fingernails or suck their thumbs. °· People who trim their nails too short. °· People who have hangnails or injured fingertips. °· People who get manicures. °· People who have diabetes. °SYMPTOMS °Symptoms of this condition include: °· Redness and swelling of the skin near the nail. °· Tenderness around the nail when you touch the area. °· Pus-filled bumps under the cuticle. The cuticle is the skin at the base or sides of the nail. °· Fluid or pus under the nail. °· Throbbing pain in the area. °DIAGNOSIS °This condition is usually diagnosed with a physical exam. In some cases, a sample of pus may be taken from an abscess to be tested in a lab. This can help to determine what type of bacteria or fungi is causing the condition. °TREATMENT °Treatment for this condition depends on the cause and severity of the condition. If the condition is mild, it may clear up on its own in a few days. Your health care provider may recommend soaking the affected area in warm water a few times a day. When treatment is needed, the options may  include: °· Antibiotic medicine, if the condition is caused by a bacterial infection. °· Antifungal medicine, if the condition is caused by a fungal infection. °· Incision and drainage, if an abscess is present. In this procedure, the health care provider will cut open the abscess so the pus can drain out. °HOME CARE INSTRUCTIONS °· Soak the affected area in warm water if directed to do so by your health care provider. You may be told to do this for 20 minutes, 2-3 times a day. Keep the area dry in between soakings. °· Take medicines only as directed by your health care provider. °· If you were prescribed an antibiotic medicine, finish all of it even if you start to feel better. °· Keep the affected area clean. °· Do not try to drain a fluid-filled bump yourself. °· If you will be washing dishes or performing other tasks that require your hands to get wet, wear rubber gloves. You should also wear gloves if your hands might come in contact with irritating substances, such as cleaners or chemicals. °· Follow your health care provider's instructions about: °¨ Wound care. °¨ Bandage (dressing) changes and removal. °SEEK MEDICAL CARE IF: °· Your symptoms get worse or do not improve with treatment. °· You have a fever or chills. °· You have redness spreading from the affected area. °· You have continued or increased fluid, blood, or pus coming from the affected area. °· Your finger or knuckle becomes swollen or is difficult to move. °  °  This information is not intended to replace advice given to you by your health care provider. Make sure you discuss any questions you have with your health care provider. °  °Document Released: 06/15/2000 Document Revised: 05/06/2014 Document Reviewed: 11/27/2013 °Elsevier Interactive Patient Education ©2016 Else[employer] Inc[employer] ° °

## 2015-04-25 ENCOUNTER — Emergency Department (HOSPITAL_COMMUNITY)
Admission: EM | Admit: 2015-04-25 | Discharge: 2015-04-25 | Disposition: A | Discharge status: Home / Self Care | Payer: BLUE CROSS/BLUE SHIELD | Attending: Emergency Medicine | Admitting: Emergency Medicine

## 2015-04-25 ENCOUNTER — Encounter (HOSPITAL_COMMUNITY): Payer: Self-pay | Admitting: Emergency Medicine

## 2015-04-25 DIAGNOSIS — F17210 Nicotine dependence, cigarettes, uncomplicated: Secondary | ICD-10-CM | POA: Diagnosis not present

## 2015-04-25 DIAGNOSIS — L03011 Cellulitis of right finger: Secondary | ICD-10-CM | POA: Insufficient documentation

## 2015-04-25 DIAGNOSIS — L02511 Cutaneous abscess of right hand: Secondary | ICD-10-CM | POA: Diagnosis present

## 2015-04-25 MED ORDER — BACITRACIN ZINC 500 UNIT/GM EX OINT
TOPICAL_OINTMENT | CUTANEOUS | Status: AC
  Filled 2015-04-25: qty 0.9

## 2015-04-25 MED ORDER — SULFAMETHOXAZOLE-TRIMETHOPRIM 800-160 MG PO TABS: 1.0000 | ORAL_TABLET | Freq: Two times a day (BID) | ORAL | Status: AC

## 2015-04-25 MED ORDER — HYDROCODONE-ACETAMINOPHEN 5-325 MG PO TABS: 1.0000 | ORAL_TABLET | Freq: Four times a day (QID) | ORAL | Status: DC | PRN

## 2015-04-25 MED ORDER — CEPHALEXIN 500 MG PO CAPS: 500.0000 mg | ORAL_CAPSULE | Freq: Four times a day (QID) | ORAL | Status: DC

## 2015-04-25 MED ORDER — OXYCODONE-ACETAMINOPHEN 5-325 MG PO TABS
1.0000 | ORAL_TABLET | Freq: Once | ORAL | Status: AC
  Administered 2015-04-25: 1 via ORAL
  Filled 2015-04-25: qty 1

## 2015-04-25 NOTE — Discharge Instructions (Signed)
Take ibuprofen or another NSAID for pain regularly. Take norco for severe pain as prescribed as needed. Warm antibacterial water soaks every few hours at home. Take both antibiotics as prescribed until all gone. Follow up with your doctor.    Paronychia Paronychia is an infection of the skin that surrounds a nail. It usually affects the skin around a fingernail, but it may also occur near a toenail. It often causes pain and swelling around the nail. This condition may come on suddenly or develop over a longer period. In some cases, a collection of pus (abscess) can form near or under the nail. Usually, paronychia is not serious and it clears up with treatment. CAUSES This condition may be caused by bacteria or fungi. It is commonly caused by either Streptococcus or Staphylococcus bacteria. The bacteria or fungi often cause the infection by getting into the affected area through an opening in the skin, such as a cut or a hangnail. RISK FACTORS This condition is more likely to develop in:  People who get their hands wet often, such as those who work as Fish farm manager, bartenders, or nurses.  People who bite their fingernails or suck their thumbs.  People who trim their nails too short.  People who have hangnails or injured fingertips.  People who get manicures.  People who have diabetes. SYMPTOMS Symptoms of this condition include:  Redness and swelling of the skin near the nail.  Tenderness around the nail when you touch the area.  Pus-filled bumps under the cuticle. The cuticle is the skin at the base or sides of the nail.  Fluid or pus under the nail.  Throbbing pain in the area. DIAGNOSIS This condition is usually diagnosed with a physical exam. In some cases, a sample of pus may be taken from an abscess to be tested in a lab. This can help to determine what type of bacteria or fungi is causing the condition. TREATMENT Treatment for this condition depends on the cause and severity  of the condition. If the condition is mild, it may clear up on its own in a few days. Your health care provider may recommend soaking the affected area in warm water a few times a day. When treatment is needed, the options may include:  Antibiotic medicine, if the condition is caused by a bacterial infection.  Antifungal medicine, if the condition is caused by a fungal infection.  Incision and drainage, if an abscess is present. In this procedure, the health care provider will cut open the abscess so the pus can drain out. HOME CARE INSTRUCTIONS  Soak the affected area in warm water if directed to do so by your health care provider. You may be told to do this for 20 minutes, 2-3 times a day. Keep the area dry in between soakings.  Take medicines only as directed by your health care provider.  If you were prescribed an antibiotic medicine, finish all of it even if you start to feel better.  Keep the affected area clean.  Do not try to drain a fluid-filled bump yourself.  If you will be washing dishes or performing other tasks that require your hands to get wet, wear rubber gloves. You should also wear gloves if your hands might come in contact with irritating substances, such as cleaners or chemicals.  Follow your health care provider's instructions about:  Wound care.  Bandage (dressing) changes and removal. SEEK MEDICAL CARE IF:  Your symptoms get worse or do not improve with treatment.  You have a fever or chills.  You have redness spreading from the affected area.  You have continued or increased fluid, blood, or pus coming from the affected area.  Your finger or knuckle becomes swollen or is difficult to move.   This information is not intended to replace advice given to you by your health care provider. Make sure you discuss any questions you have with your health care provider.   Document Released: 06/15/2000 Document Revised: 05/06/2014 Document Reviewed:  11/27/2013 Elsevier Interactive Patient Education [employer]

## 2015-04-25 NOTE — ED Notes (Signed)
Pt states she has been having swelling to her right thumb for 3 days.

## 2015-04-25 NOTE — ED Notes (Signed)
Dressing applied to L thumb. Bacitracin ointment placed.

## 2015-04-25 NOTE — ED Provider Notes (Signed)
CSN: [id number]     Arrival date & time 04/25/15  1514 History   First MD Initiated Contact with Patient 04/25/15 1523     Chief Complaint  Patient presents with  . Abscess     (Consider location/radiation/quality/duration/timing/severity/associated sxs/prior Treatment) HPI [name] Taylor is a 30 y.o. female presents to emergency department complaining of swelling and pain to the right thumb for several days. Patient states she had similar problem about a week ago. She was seen in emergency department and was diagnosed. A care which was drained. Patient states however there was no pus that came out when they drained it. She was started on Keflex which she is still taking. She reports some improvement in the swelling initially when she started taking Keflex, however the last 3 days he has gotten worse. 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 She denies any injuries to the finger. No fever or chills. No other complaints. She has not tried any soaks or any other treatment other than Keflex prior to coming in.  Past Medical History  Diagnosis Date  . Chronic headaches   . Chronic leg pain     left lower leg  . Burning with urination 10/02/2013  . Hematuria 10/02/2013  . LLQ pain 10/02/2013  . Vaginal discharge 10/02/2013   History reviewed. No pertinent past surgical history. Family History  Problem Relation Age of Onset  . Other Maternal Grandmother     abnormal heart beat   Social History  Substance Use Topics  . Smoking status: Current Every Day Smoker -- 1.00 packs/day    Types: Cigarettes  . Smokeless tobacco: Never Used  . Alcohol Use: No   OB History    Gravida Para Term Preterm AB TAB SAB Ectopic Multiple Living   Review of Systems  Constitutional: Negative for fever and chills.  Musculoskeletal: Positive for myalgias and arthralgias.  Skin: Positive for wound.  All other systems reviewed and are  negative.     Allergies  Review of patient's allergies indicates no known allergies.  Home Medications   Prior to Admission medications   Medication Sig Start Date End Date Taking? Authorizing Provider  cephALEXin (KEFLEX) 500 MG capsule Take 1 capsule (500 mg total) by mouth 4 (four) times daily. 04/12/15   Kristen N Ward, DO  diclofenac (VOLTAREN) 75 MG EC tablet Take 1 tablet (75 mg total) by mouth 2 (two) times daily. Take with food 09/10/14   Tammy Triplett, PA-C  etonogestrel (IMPLANON) 68 MG IMPL implant Inject 1 each into the skin once.      Historical Provider, MD  HYDROcodone-acetaminophen (NORCO/VICODIN) 5-325 MG per tablet Take one-two tabs po q 4-6 hrs prn pain 04/17/14   Tammy Triplett, PA-C  ibuprofen (ADVIL,MOTRIN) 800 MG tablet Take 1 tablet (800 mg total) by mouth every 8 (eight) hours as needed for mild pain. 04/12/15   Kristen N Ward, DO  ketorolac (TORADOL) 10 MG tablet Take 1 tablet (10 mg total) by mouth every 6 (six) hours as needed. 08/07/14   Eber Hong, MD  naproxen (NAPROSYN) 500 MG tablet Take 1 tablet (500 mg total) by mouth 2 (two) times daily with a meal. 04/17/14   Tammy Triplett, PA-C  penicillin v potassium (VEETID) 500 MG tablet Take 1 tablet (500 mg total) by mouth 4 (four) times daily. For 7 days 09/10/14  Tammy Triplett, PA-C   BP 107/77 mmHg  Pulse 90  Temp(Src) 98.3 F (36.8 C) (Oral)  Resp 18  Ht 5\' 1"  (1.549 m)  Wt 68.04 kg  BMI 28.36 kg/m2  SpO2 100% Physical Exam  Constitutional: She appears well-developed and well-nourished. No distress.  Eyes: Conjunctivae are normal.  Neck: Neck supple.  Musculoskeletal:  Swelling noted to the right thumb, specifically over the lateral aspect of the fingernail with a pus pocket. Distal phalanx of the thumb is erythematous, tender to palpation. No drainage.  Neurological: She is alert.  Skin: Skin is warm and dry.  Nursing note and vitals reviewed.   ED Course  .Marland Kitchen.Incision and Drainage Date/Time:  04/25/2015 3:38 PM Performed by: Jaynie CrumbleKIRICHENKO, Lovada Barwick Authorized by: Jaynie CrumbleKIRICHENKO, Livian Vanderbeck Consent: Verbal consent obtained. Consent given by: patient Patient understanding: patient states understanding of the procedure being performed Patient identity confirmed: verbally with patient and arm band Type: abscess Body area: upper extremity Location details: right thumb Patient sedated: no Risk factor: coagulopathy Scalpel size: 11 Incision depth: subcutaneous Complexity: simple Drainage: purulent Drainage amount: copious Wound treatment: wound left open Packing material: none Patient tolerance: Patient tolerated the procedure well with no immediate complications Comments: Drainage of paronychia   (including critical care time) Labs Review Labs Reviewed - No data to display  Imaging Review No results found. I have personally reviewed and evaluated these images and lab results as part of my medical decision-making.   EKG Interpretation None      MDM   Final diagnoses:  Paronychia of thumb, right    patient with perineal care to the right thumb, this is a recurrent problem. Incised and drained. Will soak in emergency department. He will continue on Keflex for another week and add Bactrim for full coverage. Return precautions discussed. She is otherwise nontoxic., No evidence of a felon or cellulitis of the skin.  Filed Vitals:   04/25/15 1520  BP: 107/77  Pulse: 90  Temp: 98.3 F (36.8 C)  TempSrc: Oral  Resp: 18  Height: 5\' 1"  (1.549 m)  Weight: 68.04 kg  SpO2: 100%     Jaynie Crumbleatyana Maika Mcelveen, PA-C 04/25/15 1604  Samuel JesterKathleen McManus, DO 04/27/15 1657

## 2015-04-25 NOTE — ED Notes (Signed)
Pt seen and evaluated by EDPa for initial assessment. 

## 2015-07-19 ENCOUNTER — Emergency Department (HOSPITAL_COMMUNITY)
Admission: EM | Admit: 2015-07-19 | Discharge: 2015-07-19 | Disposition: A | Discharge status: Home / Self Care | Payer: BLUE CROSS/BLUE SHIELD | Attending: Emergency Medicine | Admitting: Emergency Medicine

## 2015-07-19 ENCOUNTER — Encounter (HOSPITAL_COMMUNITY): Payer: Self-pay | Admitting: Emergency Medicine

## 2015-07-19 DIAGNOSIS — R197 Diarrhea, unspecified: Secondary | ICD-10-CM | POA: Diagnosis not present

## 2015-07-19 DIAGNOSIS — N898 Other specified noninflammatory disorders of vagina: Secondary | ICD-10-CM | POA: Diagnosis not present

## 2015-07-19 DIAGNOSIS — F17210 Nicotine dependence, cigarettes, uncomplicated: Secondary | ICD-10-CM | POA: Insufficient documentation

## 2015-07-19 DIAGNOSIS — Z202 Contact with and (suspected) exposure to infections with a predominantly sexual mode of transmission: Secondary | ICD-10-CM

## 2015-07-19 DIAGNOSIS — Z79899 Other long term (current) drug therapy: Secondary | ICD-10-CM | POA: Diagnosis not present

## 2015-07-19 DIAGNOSIS — R1030 Lower abdominal pain, unspecified: Secondary | ICD-10-CM | POA: Insufficient documentation

## 2015-07-19 DIAGNOSIS — A64 Unspecified sexually transmitted disease: Secondary | ICD-10-CM | POA: Diagnosis present

## 2015-07-19 LAB — WET PREP, GENITAL
Sperm: NONE SEEN
TRICH WET PREP: NONE SEEN
Yeast Wet Prep HPF POC: NONE SEEN

## 2015-07-19 LAB — URINE MICROSCOPIC-ADD ON

## 2015-07-19 LAB — URINALYSIS, ROUTINE W REFLEX MICROSCOPIC
Bilirubin Urine: NEGATIVE
GLUCOSE, UA: NEGATIVE mg/dL
KETONES UR: NEGATIVE mg/dL
Nitrite: NEGATIVE
PROTEIN: NEGATIVE mg/dL
Specific Gravity, Urine: <1.005 — ABNORMAL LOW (ref 1.005–1.030)
pH: 6.5 (ref 5.0–8.0)

## 2015-07-19 LAB — POC URINE PREG, ED: Preg Test, Ur: NEGATIVE

## 2015-07-19 MED ORDER — CEFTRIAXONE SODIUM 250 MG IJ SOLR
250.0000 mg | Freq: Once | INTRAMUSCULAR | Status: AC
  Administered 2015-07-19: 250 mg via INTRAMUSCULAR
  Filled 2015-07-19: qty 250

## 2015-07-19 MED ORDER — AZITHROMYCIN 250 MG PO TABS
1000.0000 mg | ORAL_TABLET | Freq: Once | ORAL | Status: AC
  Administered 2015-07-19: 1000 mg via ORAL
  Filled 2015-07-19: qty 4

## 2015-07-19 MED ORDER — STERILE WATER FOR INJECTION IJ SOLN
INTRAMUSCULAR | Status: AC
  Filled 2015-07-19: qty 10

## 2015-07-19 NOTE — Discharge Instructions (Signed)
Sexually Transmitted Disease °A sexually transmitted disease (STD) is a disease or infection that may be passed (transmitted) from person to person, usually during sexual activity. This may happen by way of saliva, semen, blood, vaginal mucus, or urine. Common STDs include: °· Gonorrhea. °· Chlamydia. °· Syphilis. °· HIV and AIDS. °· Genital herpes. °· Hepatitis B and C. °· Trichomonas. °· Human papillomavirus (HPV). °· Pubic lice. °· Scabies. °· Mites. °· Bacterial vaginosis. °WHAT ARE CAUSES OF STDs? °An STD may be caused by bacteria, a virus, or parasites. STDs are often transmitted during sexual activity if one person is infected. However, they may also be transmitted through nonsexual means. STDs may be transmitted after:  °· Sexual intercourse with an infected person. °· Sharing sex toys with an infected person. °· Sharing needles with an infected person or using unclean piercing or tattoo needles. °· Having intimate contact with the genitals, mouth, or rectal areas of an infected person. °· Exposure to infected fluids during birth. °WHAT ARE THE SIGNS AND SYMPTOMS OF STDs? °Different STDs have different symptoms. Some people may not have any symptoms. If symptoms are present, they may include: °· Painful or bloody urination. °· Pain in the pelvis, abdomen, vagina, anus, throat, or eyes. °· A skin rash, itching, or irritation. °· Growths, ulcerations, blisters, or sores in the genital and anal areas. °· Abnormal vaginal discharge with or without bad odor. °· Penile discharge in men. °· Fever. °· Pain or bleeding during sexual intercourse. °· Swollen glands in the groin area. °· Yellow skin and eyes (jaundice). This is seen with hepatitis. °· Swollen testicles. °· Infertility. °· Sores and blisters in the mouth. °HOW ARE STDs DIAGNOSED? °To make a diagnosis, your health care provider may: °· Take a medical history. °· Perform a physical exam. °· Take a sample of any discharge to examine. °· Swab the throat,  cervix, opening to the penis, rectum, or vagina for testing. °· Test a sample of your first morning urine. °· Perform blood tests. °· Perform a Pap test, if this applies. °· Perform a colposcopy. °· Perform a laparoscopy. °HOW ARE STDs TREATED? °Treatment depends on the STD. Some STDs may be treated but not cured. °· Chlamydia, gonorrhea, trichomonas, and syphilis can be cured with antibiotic medicine. °· Genital herpes, hepatitis, and HIV can be treated, but not cured, with prescribed medicines. The medicines lessen symptoms. °· Genital warts from HPV can be treated with medicine or by freezing, burning (electrocautery), or surgery. Warts may come back. °· HPV cannot be cured with medicine or surgery. However, abnormal areas may be removed from the cervix, vagina, or vulva. °· If your diagnosis is confirmed, your recent sexual partners need treatment. This is true even if they are symptom-free or have a negative culture or evaluation. They should not have sex until their health care providers say it is okay. °· Your health care provider may test you for infection again 3 months after treatment. °HOW CAN I REDUCE MY RISK OF GETTING AN STD? °Take these steps to reduce your risk of getting an STD: °· Use latex condoms, dental dams, and water-soluble lubricants during sexual activity. Do not use petroleum jelly or oils. °· Avoid having multiple sex partners. °· Do not have sex with someone who has other sex partners °· Do not have sex with anyone you do not know or who is at high risk for an STD. °· Avoid risky sex practices that can break your skin. °· Do not have sex   if you have open sores on your mouth or skin.  Avoid drinking too much alcohol or taking illegal drugs. Alcohol and drugs can affect your judgment and put you in a vulnerable position.  Avoid engaging in oral and anal sex acts.  Get vaccinated for HPV and hepatitis. If you have not received these vaccines in the past, talk to your health care  provider about whether one or both might be right for you.  If you are at risk of being infected with HIV, it is recommended that you take a prescription medicine daily to prevent HIV infection. This is called pre-exposure prophylaxis (PrEP). You are considered at risk if:  You are a man who has sex with other men (MSM).  You are a heterosexual man or woman and are sexually active with more than one partner.  You take drugs by injection.  You are sexually active with a partner who has HIV.  Talk with your health care provider about whether you are at high risk of being infected with HIV. If you choose to begin PrEP, you should first be tested for HIV. You should then be tested every 3 months for as long as you are taking PrEP. WHAT SHOULD I DO IF I THINK I HAVE AN STD?  See your health care provider.  Tell your sexual partner(s). They should be tested and treated for any STDs.  Do not have sex until your health care provider says it is okay. WHEN SHOULD I GET IMMEDIATE MEDICAL CARE? Contact your health care provider right away if:   You have severe abdominal pain.  You are a man and notice swelling or pain in your testicles.  You are a woman and notice swelling or pain in your vagina.   This information is not intended to replace advice given to you by your health care provider. Make sure you discuss any questions you have with your health care provider.   Document Released: 03/12/2002 Document Revised: 01/10/2014 Document Reviewed: 07/10/2012 Elsevier Interactive Patient Education 2016 [place] Sex Safe sex is about reducing the risk of giving or getting a sexually transmitted disease (STD). STDs are spread through sexual contact involving the genitals, mouth, or rectum. Some STDs can be cured and others cannot. Safe sex can also prevent unintended pregnancies.  WHAT ARE SOME SAFE SEX PRACTICES?  Limit your sexual activity to only one partner who is having sex with  only you.  Talk to your partner about his or her past partners, past STDs, and drug use.  Use a condom every time you have sexual intercourse. This includes vaginal, oral, and anal sexual activity. Both females and males should wear condoms during oral sex. Only use latex or polyurethane condoms and water-based lubricants. Using petroleum-based lubricants or oils to lubricate a condom will weaken the condom and increase the chance that it will break. The condom should be in place from the beginning to the end of sexual activity. Wearing a condom reduces, but does not completely eliminate, your risk of getting or giving an STD. STDs can be spread by contact with infected body fluids and skin.  Get vaccinated for hepatitis B and HPV.  Avoid alcohol and recreational drugs, which can affect your judgment. You may forget to use a condom or participate in high-risk sex.  For females, avoid douching after sexual intercourse. Douching can spread an infection farther into the reproductive tract.  Check your body for signs of sores, blisters, rashes, or unusual discharge.  See your health care provider if you notice any of these signs.  Avoid sexual contact if you have symptoms of an infection or are being treated for an STD. If you or your partner has herpes, avoid sexual contact when blisters are present. Use condoms at all other times.  If you are at risk of being infected with HIV, it is recommended that you take a prescription medicine daily to prevent HIV infection. This is called pre-exposure prophylaxis (PrEP). You are considered at risk if:  You are a man who has sex with other men (MSM).  You are a heterosexual man or woman who is sexually active with more than one partner.  You take drugs by injection.  You are sexually active with a partner who has HIV.  Talk with your health care provider about whether you are at high risk of being infected with HIV. If you choose to begin PrEP, you  should first be tested for HIV. You should then be tested every 3 months for as long as you are taking PrEP.  See your health care provider for regular screenings, exams, and tests for other STDs. Before having sex with a new partner, each of you should be screened for STDs and should talk about the results with each other. WHAT ARE THE BENEFITS OF SAFE SEX?   There is less chance of getting or giving an STD.  You can prevent unwanted or unintended pregnancies.  By discussing safe sex concerns with your partner, you may increase feelings of intimacy, comfort, trust, and honesty between the two of you.   This information is not intended to replace advice given to you by your health care provider. Make sure you discuss any questions you have with your health care provider.   Document Released: 01/28/2004 Document Revised: 01/10/2014 Document Reviewed: 06/13/2011 Elsevier Interactive Patient Education 2016 [employer]   You have been treated for gonorrhea and chlamydia today given your boyfriends probable source of his symptoms.  Your lab tests and cultures will be resulted within the next several days.

## 2015-07-19 NOTE — ED Notes (Signed)
Pt reports was informed yesterday that significant other has an STD. Pt reports wants to be evaluated. Pt denies any abd pain,discharge, odor.

## 2015-07-19 NOTE — ED Provider Notes (Signed)
CSN: [id number]     Arrival date & time 07/19/15  1350 History  By signing my name below, I, [name] Martinva Taylor, attest that this documentation has been prepared under the direction and in the presence of Burgess AmorJulie Hatsumi Steinhart, PA-C. Electronically Signed: Doreatha MartinEva Taylor, ED Scribe. 07/19/2015. 3:15 PM.    Chief Complaint  Patient presents with  . SEXUALLY TRANSMITTED DISEASE   The history is provided by the patient. No language interpreter was used.   HPI Comments: [name] Taylor is a 30 y.o. female who presents to the Emergency Department requesting STD check after unprotected sex within the last week. Pt states that her sexual partner informed her that he tested positive for an STD yesterday, which he did not specify to her. She notes that he told her he was checked out for dysuria and penile discharge. She states she had unprotected sex with this partner within the last week. Pt currently complains of cramping lower abdominal pain for 3 days and diarrhea (x4) this morning. LMP in 2011 (pt has Implanon). She denies fever, nausea, emesis, vaginal discharge or dysuria.    Past Medical History  Diagnosis Date  . Chronic headaches   . Chronic leg pain     left lower leg  . Burning with urination 10/02/2013  . Hematuria 10/02/2013  . LLQ pain 10/02/2013  . Vaginal discharge 10/02/2013   History reviewed. No pertinent past surgical history. Family History  Problem Relation Age of Onset  . Other Maternal Grandmother     abnormal heart beat   Social History  Substance Use Topics  . Smoking status: Current Every Day Smoker -- 1.00 packs/day    Types: Cigarettes  . Smokeless tobacco: Never Used  . Alcohol Use: No   OB History    Gravida Para Term Preterm AB TAB SAB Ectopic Multiple Living   3 3  1            Review of Systems  Constitutional: Negative for fever.  Gastrointestinal: Positive for abdominal pain and diarrhea. Negative for nausea and vomiting.  Genitourinary: Negative for vaginal discharge.    Allergies  Review of patient's allergies indicates no known allergies.  Home Medications   Prior to Admission medications   Medication Sig Start Date End Date Taking? Authorizing Provider  etonogestrel (IMPLANON) 68 MG IMPL implant Inject 1 each into the skin once.     Yes Historical Provider, MD  cephALEXin (KEFLEX) 500 MG capsule Take 1 capsule (500 mg total) by mouth 4 (four) times daily. Patient not taking: Reported on 07/19/2015 04/12/15   Kristen N Ward, DO  cephALEXin (KEFLEX) 500 MG capsule Take 1 capsule (500 mg total) by mouth 4 (four) times daily. Patient not taking: Reported on 07/19/2015 04/25/15   Jaynie Crumbleatyana Kirichenko, PA-C  HYDROcodone-acetaminophen (NORCO) 5-325 MG tablet Take 1 tablet by mouth every 6 (six) hours as needed for moderate pain. Patient not taking: Reported on 07/19/2015 04/25/15   Tatyana Kirichenko, PA-C   BP 123/71 mmHg  Pulse 65  Temp(Src) 98.2 F (36.8 C) (Oral)  Resp 18  Ht 5\' 1"  (1.549 m)  Wt 71.215 kg  BMI 29.68 kg/m2  SpO2 100% Physical Exam  Constitutional: She appears well-developed and well-nourished.  HENT:  Head: Normocephalic.  Eyes: Conjunctivae are normal.  Cardiovascular: Normal rate.   Pulmonary/Chest: Effort normal. No respiratory distress.  Abdominal: She exhibits no distension.  Genitourinary: Uterus normal. Cervix exhibits no motion tenderness. Right adnexum displays no mass, no tenderness and no fullness. Left adnexum displays  no mass, no tenderness and no fullness. Vaginal discharge found.  Chaperone present throughout entire exam.  Scant white dc in vagina.  No dc from cervical os.  Musculoskeletal: Normal range of motion.  Neurological: She is alert.  Skin: Skin is warm and dry.  Psychiatric: She has a normal mood and affect. Her behavior is normal.  Nursing note and vitals reviewed.   ED Course  Procedures (including critical care time) DIAGNOSTIC STUDIES: Oxygen Saturation is 100% on RA, normal by my interpretation.     COORDINATION OF CARE: 3:13 PM Discussed treatment plan with pt at bedside which includes wet prep, HIV, RPR, GC/CHL, UA and pt agreed to plan.   Labs Review Labs Reviewed  WET PREP, GENITAL - Abnormal; Notable for the following:    Clue Cells Wet Prep HPF POC PRESENT (*)    WBC, Wet Prep HPF POC MANY (*)    All other components within normal limits  URINALYSIS, ROUTINE W REFLEX MICROSCOPIC (NOT AT [facility]) - Abnormal; Notable for the following:    Specific Gravity, Urine <1.005 (*)    Hgb urine dipstick TRACE (*)    Leukocytes, UA TRACE (*)    All other components within normal limits  URINE MICROSCOPIC-ADD ON - Abnormal; Notable for the following:    Squamous Epithelial / LPF 0-5 (*)    Bacteria, UA FEW (*)    All other components within normal limits  HIV ANTIBODY (ROUTINE TESTING)  POC URINE PREG, ED  GC/CHLAMYDIA PROBE AMP ([place]) NOT AT [facility]   I have personally reviewed and evaluated these lab results as part of my medical decision-making.   MDM   Final diagnoses:  STD exposure    Patients  labs reviewed.  Results were also discussed with patient. Pt aware cultures pending.  No sx suggesting bacterial vaginosis.  She was treated with rocephin and zithromax given probable exposures.     I personally performed the services described in this documentation, which was scribed in my presence. The recorded information has been reviewed and is accurate.   Burgess Amor, PA-C 07/19/15 1614  Donnetta Hutching, MD 07/19/15 [phone]

## 2015-07-20 LAB — HIV ANTIBODY (ROUTINE TESTING W REFLEX): HIV SCREEN 4TH GENERATION: NONREACTIVE

## 2015-07-20 LAB — GC/CHLAMYDIA PROBE AMP (~~LOC~~) NOT AT ARMC
CHLAMYDIA, DNA PROBE: NEGATIVE
NEISSERIA GONORRHEA: NEGATIVE

## 2016-01-15 DIAGNOSIS — Z01419 Encounter for gynecological examination (general) (routine) without abnormal findings: Secondary | ICD-10-CM | POA: Diagnosis not present

## 2016-01-15 DIAGNOSIS — Z3046 Encounter for surveillance of implantable subdermal contraceptive: Secondary | ICD-10-CM | POA: Diagnosis not present

## 2016-01-15 DIAGNOSIS — K5900 Constipation, unspecified: Secondary | ICD-10-CM | POA: Diagnosis not present

## 2016-01-15 DIAGNOSIS — Z3009 Encounter for other general counseling and advice on contraception: Secondary | ICD-10-CM | POA: Diagnosis not present

## 2016-02-02 DIAGNOSIS — Z3046 Encounter for surveillance of implantable subdermal contraceptive: Secondary | ICD-10-CM | POA: Diagnosis not present

## 2016-02-02 DIAGNOSIS — Z3009 Encounter for other general counseling and advice on contraception: Secondary | ICD-10-CM | POA: Diagnosis not present

## 2016-04-13 ENCOUNTER — Encounter (HOSPITAL_COMMUNITY): Payer: Self-pay | Admitting: Emergency Medicine

## 2016-04-13 ENCOUNTER — Emergency Department (HOSPITAL_COMMUNITY)
Admission: EM | Admit: 2016-04-13 | Discharge: 2016-04-13 | Disposition: A | Discharge status: Home / Self Care | Payer: BLUE CROSS/BLUE SHIELD | Attending: Emergency Medicine | Admitting: Emergency Medicine

## 2016-04-13 DIAGNOSIS — R21 Rash and other nonspecific skin eruption: Secondary | ICD-10-CM | POA: Diagnosis present

## 2016-04-13 DIAGNOSIS — L245 Irritant contact dermatitis due to other chemical products: Secondary | ICD-10-CM | POA: Diagnosis not present

## 2016-04-13 DIAGNOSIS — F17210 Nicotine dependence, cigarettes, uncomplicated: Secondary | ICD-10-CM | POA: Insufficient documentation

## 2016-04-13 DIAGNOSIS — Z79899 Other long term (current) drug therapy: Secondary | ICD-10-CM | POA: Diagnosis not present

## 2016-04-13 MED ORDER — TRIAMCINOLONE ACETONIDE 0.1 % EX CREA: 1.0000 "application " | TOPICAL_CREAM | Freq: Two times a day (BID) | CUTANEOUS | 0 refills | Status: DC

## 2016-04-13 NOTE — Discharge Instructions (Signed)
Apply the steroid cream twice daily for 10 days.  Do not continue using if this is not helping your rash.  As discussed,  you may benefit from a layer of bandage before putting on your work gloves.

## 2016-04-13 NOTE — ED Triage Notes (Signed)
Uses chemicals at work. Pt c/o rashlike area to left hand x 1 week . nad.

## 2016-04-14 NOTE — ED Provider Notes (Signed)
[facility]EMER[facility]Y DEPT Provider Note   CSN: [id number] Arrival date & time: 04/13/16  0801     History   Chief Complaint Chief Complaint  Patient presents with  . Rash    HPI [name] Taylor is a 31 y.o. female presenting with a one week history of a pruritic and irritating rash located on her left hand.  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  This is a new position and a new chemical that she's been exposed to for the past 2 weeks.  She has had no treatments for this condition and has found no alleviators.  She has had no change in the protective gloves that she's been wearing.  The history is provided by the patient.    Past Medical History:  Diagnosis Date  . Burning with urination 10/02/2013  . Chronic headaches   . Chronic leg pain    left lower leg  . Hematuria 10/02/2013  . LLQ pain 10/02/2013  . Vaginal discharge 10/02/2013    Patient Active Problem List   Diagnosis Date Noted  . Burning with urination 10/02/2013  . Hematuria 10/02/2013  . LLQ pain 10/02/2013  . Vaginal discharge 10/02/2013    History reviewed. No pertinent surgical history.  OB History    Gravida Para Term Preterm AB Living   SAB TAB Ectopic Multiple Live Births                   Home Medications    Prior to Admission medications   Medication Sig Start Date End Date Taking? Authorizing Provider  cephALEXin (KEFLEX) 500 MG capsule Take 1 capsule (500 mg total) by mouth 4 (four) times daily. Patient not taking: Reported on 07/19/2015 04/12/15   Kristen N Ward, DO  cephALEXin (KEFLEX) 500 MG capsule Take 1 capsule (500 mg total) by mouth 4 (four) times daily. Patient not  taking: Reported on 07/19/2015 04/25/15   Jaynie Crumble, PA-C  etonogestrel (IMPLANON) 68 MG IMPL implant Inject 1 each into the skin once.      Historical Provider, MD  HYDROcodone-acetaminophen (NORCO) 5-325 MG tablet Take 1 tablet by mouth every 6 (six) hours as needed for moderate pain. Patient not taking: Reported on 07/19/2015 04/25/15   Lemont Fillers Kirichenko, PA-C  triamcinolone cream (KENALOG) 0.1 % Apply 1 application topically 2 (two) times daily. 04/13/16   Burgess Amor, PA-C    Family History Family History  Problem Relation Age of Onset  . Other Maternal Grandmother     abnormal heart beat    Social History Social History  Substance Use Topics  . Smoking status: Current Every Day Smoker    Packs/day: 1.00    Types: Cigarettes  . Smokeless tobacco: Never Used  . Alcohol use No     Allergies   Patient has no known allergies.   Review of Systems Review of Systems  Constitutional: Negative for chills and fever.  Respiratory: Negative for shortness of breath and wheezing.   Skin: Positive for rash.  Neurological: Negative for numbness.     Physical Exam Updated Vital Signs BP 110/66 (BP Location: Left  Arm)   Pulse (!) 54   Temp 98.1 F (36.7 C) (Oral)   Resp 16   Ht  (1.549 m)   Wt 65.8 kg   SpO2 98%   BMI 27.40 kg/m   Physical Exam  Constitutional: She appears well-developed and well-nourished. No distress.  HENT:  Head: Normocephalic.  Neck: Neck supple.  Cardiovascular: Normal rate.   Pulmonary/Chest: Effort normal. She has no wheezes.  Musculoskeletal: Normal range of motion. She exhibits no edema.  Skin: Rash noted.  Dry appearing, thickened excoriated skin with chapping along lateral border of index finger, involving finger web space and medial border of left thumb.  Several deep fissuring, no bleeding, no drainage, no surrounding erythema.      ED Treatments / Results  Labs (all labs ordered are listed, but only abnormal results are  displayed) Labs Reviewed - No data to display  EKG  EKG Interpretation None       Radiology No results found.  Procedures Procedures (including critical care time)  Medications Ordered in ED Medications - No data to display   Initial Impression / Assessment and Plan / ED Course  I have reviewed the triage vital signs and the nursing notes.  Pertinent labs & imaging results that were available during my care of the patient were reviewed by me and considered in my medical decision making (see chart for details).     Pt advised she may need to find a more protective glove or double layer. Triamcinolone, plan f/u with pcp if sx persist.  Exam c/w contact derm.  No sign of infection.  Final Clinical Impressions(s) / ED Diagnoses   Final diagnoses:  Irritant contact dermatitis due to other chemical products    New Prescriptions Discharge Medication List as of 04/13/2016  9:21 AM    START taking these medications   Details  triamcinolone cream (KENALOG) 0.1 % Apply 1 application topically 2 (two) times daily., Starting Wed 04/13/2016, Print         Burgess Amor, PA-C 04/14/16 0820    Bethann Berkshire, MD 04/14/16 [phone]

## 2016-12-14 ENCOUNTER — Emergency Department (HOSPITAL_COMMUNITY)
Admission: EM | Admit: 2016-12-14 | Discharge: 2016-12-14 | Disposition: A | Discharge status: Home / Self Care | Payer: BLUE CROSS/BLUE SHIELD | Attending: Emergency Medicine | Admitting: Emergency Medicine

## 2016-12-14 ENCOUNTER — Encounter (HOSPITAL_COMMUNITY): Payer: Self-pay

## 2016-12-14 DIAGNOSIS — Z79899 Other long term (current) drug therapy: Secondary | ICD-10-CM | POA: Insufficient documentation

## 2016-12-14 DIAGNOSIS — F17210 Nicotine dependence, cigarettes, uncomplicated: Secondary | ICD-10-CM | POA: Diagnosis not present

## 2016-12-14 DIAGNOSIS — R51 Headache: Secondary | ICD-10-CM | POA: Diagnosis not present

## 2016-12-14 DIAGNOSIS — G44209 Tension-type headache, unspecified, not intractable: Secondary | ICD-10-CM | POA: Insufficient documentation

## 2016-12-14 MED ORDER — SODIUM CHLORIDE 0.9 % IV BOLUS (SEPSIS)
1000.0000 mL | Freq: Once | INTRAVENOUS | Status: AC
  Administered 2016-12-14: 1000 mL via INTRAVENOUS

## 2016-12-14 MED ORDER — DIPHENHYDRAMINE HCL 50 MG/ML IJ SOLN
25.0000 mg | Freq: Once | INTRAMUSCULAR | Status: AC
  Administered 2016-12-14: 25 mg via INTRAVENOUS
  Filled 2016-12-14: qty 1

## 2016-12-14 MED ORDER — KETOROLAC TROMETHAMINE 30 MG/ML IJ SOLN
30.0000 mg | Freq: Once | INTRAMUSCULAR | Status: AC
  Administered 2016-12-14: 30 mg via INTRAVENOUS
  Filled 2016-12-14: qty 1

## 2016-12-14 MED ORDER — SODIUM CHLORIDE 0.9 % IV SOLN
1000.0000 mL | INTRAVENOUS | Status: DC
  Administered 2016-12-14: 1000 mL via INTRAVENOUS

## 2016-12-14 MED ORDER — METOCLOPRAMIDE HCL 5 MG/ML IJ SOLN
10.0000 mg | Freq: Once | INTRAMUSCULAR | Status: AC
  Administered 2016-12-14: 10 mg via INTRAVENOUS
  Filled 2016-12-14: qty 2

## 2016-12-14 NOTE — ED Notes (Signed)
Pt not complaining of any drowsiness. Stated pain is much better.

## 2016-12-14 NOTE — ED Provider Notes (Signed)
[facility] Provider Note   CSN: [id number] Arrival date & time: 12/14/16  1536     History   Chief Complaint Chief Complaint  Patient presents with  . Headache    HPI [name] Taylor is a 31 y.o. female.  The history is provided by the patient. No language interpreter was used.  Headache   This is a new problem. The problem occurs constantly. The problem has not changed since onset.The headache is associated with nothing. The pain is located in the frontal and bilateral region. The pain is moderate. The pain does not radiate. Pertinent negatives include no fever. She has tried nothing for the symptoms. The treatment provided no relief.  Pt complains of a headache.  Pt reports she has been under a lot of stress.   Past Medical History:  Diagnosis Date  . Burning with urination 10/02/2013  . Chronic headaches   . Chronic leg pain    left lower leg  . Hematuria 10/02/2013  . LLQ pain 10/02/2013  . Vaginal discharge 10/02/2013    Patient Active Problem List   Diagnosis Date Noted  . Burning with urination 10/02/2013  . Hematuria 10/02/2013  . LLQ pain 10/02/2013  . Vaginal discharge 10/02/2013    History reviewed. No pertinent surgical history.  OB History    Gravida Para Term Preterm AB Living   3 3   1        SAB TAB Ectopic Multiple Live Births                   Home Medications    Prior to Admission medications   Medication Sig Start Date End Date Taking? Authorizing Provider  cephALEXin (KEFLEX) 500 MG capsule Take 1 capsule (500 mg total) by mouth 4 (four) times daily. Patient not taking: Reported on 07/19/2015 04/12/15   Ward, Layla MawKristen N, DO  cephALEXin (KEFLEX) 500 MG capsule Take 1 capsule (500 mg total) by mouth 4 (four) times daily. Patient not taking: Reported on 07/19/2015 04/25/15   Jaynie CrumbleKirichenko, Tatyana, PA-C  etonogestrel (IMPLANON) 68 MG IMPL implant Inject 1 each into the skin once.      [provider]    HYDROcodone-acetaminophen (NORCO) 5-325 MG tablet Take 1 tablet by mouth every 6 (six) hours as needed for moderate pain. Patient not taking: Reported on 07/19/2015 04/25/15   Jaynie CrumbleKirichenko, Tatyana, PA-C  triamcinolone cream (KENALOG) 0.1 % Apply 1 application topically 2 (two) times daily. 04/13/16   Burgess AmorIdol, Julie, PA-C    Family History Family History  Problem Relation Age of Onset  . Other Maternal Grandmother        abnormal heart beat    Social History Social History   Tobacco Use  . Smoking status: Current Every Day Smoker    Packs/day: 1.00    Types: Cigarettes  . Smokeless tobacco: Never Used  Substance Use Topics  . Alcohol use: No  . Drug use: No     Allergies   Patient has no known allergies.   Review of Systems Review of Systems  Constitutional: Negative for fever.  Neurological: Positive for headaches.  All other systems reviewed and are negative.    Physical Exam Updated Vital Signs BP 117/84 (BP Location: Right Arm)   Pulse 63   Temp 98.4 F (36.9 C)   Resp 18   Ht 5\' 1"  (1.549 m)   Wt 65.8 kg (145 lb)   SpO2 100%   BMI 27.40 kg/m   Physical Exam  Constitutional: She is oriented to person, place, and time. She appears well-developed and well-nourished. No distress.  HENT:  Head: Normocephalic and atraumatic.  Eyes: Conjunctivae and EOM are normal. Pupils are equal, round, and reactive to light.  Neck: Neck supple.  Cardiovascular: Normal rate and regular rhythm.  No murmur heard. Pulmonary/Chest: Effort normal and breath sounds normal. No respiratory distress.  Abdominal: Soft. There is no tenderness.  Musculoskeletal: She exhibits no edema.  Neurological: She is alert and oriented to person, place, and time. She has normal strength.  Skin: Skin is warm and dry.  Psychiatric: She has a normal mood and affect.  Nursing note and vitals reviewed.    ED Treatments / Results  Labs (all labs ordered are listed, but only abnormal results are  displayed) Labs Reviewed - No data to display  EKG  EKG Interpretation None       Radiology No results found.  Procedures Procedures (including critical care time)  Medications Ordered in ED Medications  sodium chloride 0.9 % bolus 1,000 mL (0 mLs Intravenous Stopped 12/14/16 1917)  ketorolac (TORADOL) 30 MG/ML injection 30 mg (30 mg Intravenous Given 12/14/16 1826)  diphenhydrAMINE (BENADRYL) injection 25 mg (25 mg Intravenous Given 12/14/16 1826)  metoCLOPramide (REGLAN) injection 10 mg (10 mg Intravenous Given 12/14/16 1826)     Initial Impression / Assessment and Plan / ED Course  I have reviewed the triage vital signs and the nursing notes.  Pertinent labs & imaging results that were available during my care of the patient were reviewed by me and considered in my medical decision making (see chart for details).     Pt feels better,  Pt advised to sleep after leaving.   Try excedrin migraine if symptoms persist  Final Clinical Impressions(s) / ED Diagnoses   Final diagnoses:  Tension-type headache, not intractable, unspecified chronicity pattern    ED Discharge Orders    None    An After Visit Summary was printed and given to the patient.    Elson AreasSofia, [place] K, PA-C 12/14/16 2241    Jacalyn LefevreHaviland, Julie, MD 12/14/16 2308

## 2016-12-14 NOTE — ED Notes (Addendum)
Pt states left sided headache. Feeling nauseated, but hasn't thrown up yet. Is under a lot of stress. Has taken Advil with no relief. Is photosensitive

## 2016-12-14 NOTE — Discharge Instructions (Signed)
Return if any problems.

## 2016-12-14 NOTE — ED Triage Notes (Signed)
Pt reports headache today after getting off work this morning.  Reports has been under a lot of stress.  Denies n/v.

## 2017-08-20 ENCOUNTER — Emergency Department (HOSPITAL_COMMUNITY)
Admission: EM | Admit: 2017-08-20 | Discharge: 2017-08-21 | Disposition: A | Discharge status: Home / Self Care | Payer: BLUE CROSS/BLUE SHIELD | Source: Home / Self Care | Attending: Emergency Medicine | Admitting: Emergency Medicine

## 2017-08-20 ENCOUNTER — Encounter (HOSPITAL_COMMUNITY): Payer: Self-pay | Admitting: Emergency Medicine

## 2017-08-20 ENCOUNTER — Other Ambulatory Visit: Payer: Self-pay

## 2017-08-20 DIAGNOSIS — Z79899 Other long term (current) drug therapy: Secondary | ICD-10-CM | POA: Insufficient documentation

## 2017-08-20 DIAGNOSIS — R519 Headache, unspecified: Secondary | ICD-10-CM

## 2017-08-20 DIAGNOSIS — F17210 Nicotine dependence, cigarettes, uncomplicated: Secondary | ICD-10-CM | POA: Insufficient documentation

## 2017-08-20 DIAGNOSIS — R195 Other fecal abnormalities: Secondary | ICD-10-CM | POA: Diagnosis not present

## 2017-08-20 DIAGNOSIS — R51 Headache: Secondary | ICD-10-CM | POA: Insufficient documentation

## 2017-08-20 DIAGNOSIS — R300 Dysuria: Secondary | ICD-10-CM | POA: Diagnosis not present

## 2017-08-20 DIAGNOSIS — R1013 Epigastric pain: Secondary | ICD-10-CM | POA: Diagnosis not present

## 2017-08-20 DIAGNOSIS — K760 Fatty (change of) liver, not elsewhere classified: Secondary | ICD-10-CM | POA: Diagnosis not present

## 2017-08-20 DIAGNOSIS — R112 Nausea with vomiting, unspecified: Secondary | ICD-10-CM | POA: Diagnosis not present

## 2017-08-20 DIAGNOSIS — G43A1 Cyclical vomiting, intractable: Secondary | ICD-10-CM | POA: Diagnosis not present

## 2017-08-20 DIAGNOSIS — R0602 Shortness of breath: Secondary | ICD-10-CM | POA: Diagnosis not present

## 2017-08-20 DIAGNOSIS — R1011 Right upper quadrant pain: Secondary | ICD-10-CM | POA: Diagnosis not present

## 2017-08-20 MED ORDER — DEXAMETHASONE SODIUM PHOSPHATE 10 MG/ML IJ SOLN
10.0000 mg | Freq: Once | INTRAMUSCULAR | Status: AC
  Administered 2017-08-21: 10 mg via INTRAVENOUS
  Filled 2017-08-20: qty 1

## 2017-08-20 MED ORDER — SODIUM CHLORIDE 0.9 % IV BOLUS
1000.0000 mL | Freq: Once | INTRAVENOUS | Status: AC
  Administered 2017-08-21: 1000 mL via INTRAVENOUS

## 2017-08-20 MED ORDER — DIPHENHYDRAMINE HCL 50 MG/ML IJ SOLN
25.0000 mg | Freq: Once | INTRAMUSCULAR | Status: AC
  Administered 2017-08-21: 25 mg via INTRAVENOUS
  Filled 2017-08-20: qty 1

## 2017-08-20 MED ORDER — METOCLOPRAMIDE HCL 5 MG/ML IJ SOLN
10.0000 mg | Freq: Once | INTRAMUSCULAR | Status: AC
  Administered 2017-08-21: 10 mg via INTRAVENOUS
  Filled 2017-08-20: qty 2

## 2017-08-20 MED ORDER — KETOROLAC TROMETHAMINE 30 MG/ML IJ SOLN
30.0000 mg | Freq: Once | INTRAMUSCULAR | Status: AC
  Administered 2017-08-21: 30 mg via INTRAVENOUS
  Filled 2017-08-20: qty 1

## 2017-08-20 NOTE — ED Triage Notes (Signed)
Pt c/o headache all day that has not been relieved by Advil or Tylenol

## 2017-08-21 ENCOUNTER — Other Ambulatory Visit: Payer: Self-pay

## 2017-08-21 ENCOUNTER — Encounter (HOSPITAL_COMMUNITY): Payer: Self-pay | Admitting: Emergency Medicine

## 2017-08-21 ENCOUNTER — Observation Stay (HOSPITAL_COMMUNITY)
Admission: EM | Admit: 2017-08-21 | Discharge: 2017-08-23 | Disposition: A | Discharge status: Home / Self Care | Payer: BLUE CROSS/BLUE SHIELD | Attending: Internal Medicine | Admitting: Internal Medicine

## 2017-08-21 DIAGNOSIS — R112 Nausea with vomiting, unspecified: Principal | ICD-10-CM

## 2017-08-21 DIAGNOSIS — R1115 Cyclical vomiting syndrome unrelated to migraine: Secondary | ICD-10-CM

## 2017-08-21 DIAGNOSIS — G43A1 Cyclical vomiting, intractable: Secondary | ICD-10-CM | POA: Diagnosis not present

## 2017-08-21 DIAGNOSIS — R109 Unspecified abdominal pain: Secondary | ICD-10-CM

## 2017-08-21 DIAGNOSIS — R300 Dysuria: Secondary | ICD-10-CM | POA: Diagnosis not present

## 2017-08-21 DIAGNOSIS — R1011 Right upper quadrant pain: Secondary | ICD-10-CM

## 2017-08-21 DIAGNOSIS — Z720 Tobacco use: Secondary | ICD-10-CM

## 2017-08-21 DIAGNOSIS — Z79899 Other long term (current) drug therapy: Secondary | ICD-10-CM | POA: Insufficient documentation

## 2017-08-21 DIAGNOSIS — R0602 Shortness of breath: Secondary | ICD-10-CM | POA: Diagnosis not present

## 2017-08-21 DIAGNOSIS — F17210 Nicotine dependence, cigarettes, uncomplicated: Secondary | ICD-10-CM | POA: Insufficient documentation

## 2017-08-21 LAB — URINALYSIS, ROUTINE W REFLEX MICROSCOPIC
BILIRUBIN URINE: NEGATIVE
Bacteria, UA: NONE SEEN
GLUCOSE, UA: NEGATIVE mg/dL
KETONES UR: NEGATIVE mg/dL
LEUKOCYTES UA: NEGATIVE
Nitrite: NEGATIVE
PH: 5 (ref 5.0–8.0)
PROTEIN: NEGATIVE mg/dL
Specific Gravity, Urine: 1.031 — ABNORMAL HIGH (ref 1.005–1.030)

## 2017-08-21 LAB — CBC WITH DIFFERENTIAL/PLATELET
BASOS ABS: 0 10*3/uL (ref 0.0–0.1)
Basophils Relative: 0 %
Eosinophils Absolute: 0 10*3/uL (ref 0.0–0.7)
Eosinophils Relative: 0 %
HEMATOCRIT: 35.7 % — AB (ref 36.0–46.0)
HEMOGLOBIN: 11.9 g/dL — AB (ref 12.0–15.0)
LYMPHS ABS: 3.3 10*3/uL (ref 0.7–4.0)
Lymphocytes Relative: 16 %
MCH: 30.9 pg (ref 26.0–34.0)
MCHC: 33.3 g/dL (ref 30.0–36.0)
MCV: 92.7 fL (ref 78.0–100.0)
Monocytes Absolute: 1.2 10*3/uL — ABNORMAL HIGH (ref 0.1–1.0)
Monocytes Relative: 6 %
NEUTROS ABS: 16 10*3/uL — AB (ref 1.7–7.7)
NEUTROS PCT: 78 %
PLATELETS: 246 10*3/uL (ref 150–400)
RBC: 3.85 MIL/uL — AB (ref 3.87–5.11)
RDW: 13 % (ref 11.5–15.5)
WBC: 20.5 10*3/uL — AB (ref 4.0–10.5)

## 2017-08-21 LAB — BASIC METABOLIC PANEL
ANION GAP: 6 (ref 5–15)
BUN: 11 mg/dL (ref 6–20)
CHLORIDE: 109 mmol/L (ref 98–111)
CO2: 23 mmol/L (ref 22–32)
Calcium: 8.1 mg/dL — ABNORMAL LOW (ref 8.9–10.3)
Creatinine, Ser: 0.77 mg/dL (ref 0.44–1.00)
GFR calc Af Amer: >60 mL/min (ref >60)
Glucose, Bld: 116 mg/dL — ABNORMAL HIGH (ref 70–99)
POTASSIUM: 3.3 mmol/L — AB (ref 3.5–5.1)
Sodium: 138 mmol/L (ref 135–145)

## 2017-08-21 LAB — HEPATIC FUNCTION PANEL
ALK PHOS: 42 U/L (ref 38–126)
ALT: 14 U/L (ref 0–44)
AST: 19 U/L (ref 15–41)
Albumin: 3.9 g/dL (ref 3.5–5.0)
BILIRUBIN INDIRECT: 0.5 mg/dL (ref 0.3–0.9)
BILIRUBIN TOTAL: 0.6 mg/dL (ref 0.3–1.2)
Bilirubin, Direct: 0.1 mg/dL (ref 0.0–0.2)
Total Protein: 6.8 g/dL (ref 6.5–8.1)

## 2017-08-21 LAB — LIPASE, BLOOD: LIPASE: 26 U/L (ref 11–51)

## 2017-08-21 LAB — PREGNANCY, URINE: Preg Test, Ur: NEGATIVE

## 2017-08-21 MED ORDER — FENTANYL CITRATE (PF) 100 MCG/2ML IJ SOLN
50.0000 ug | Freq: Once | INTRAMUSCULAR | Status: AC
  Administered 2017-08-21: 50 ug via INTRAVENOUS
  Filled 2017-08-21: qty 2

## 2017-08-21 MED ORDER — SODIUM CHLORIDE 0.9 % IV BOLUS (SEPSIS)
1000.0000 mL | Freq: Once | INTRAVENOUS | Status: AC
  Administered 2017-08-21: 1000 mL via INTRAVENOUS

## 2017-08-21 MED ORDER — ONDANSETRON HCL 4 MG/2ML IJ SOLN
4.0000 mg | Freq: Once | INTRAMUSCULAR | Status: AC
  Administered 2017-08-21: 4 mg via INTRAVENOUS
  Filled 2017-08-21: qty 2

## 2017-08-21 NOTE — Discharge Instructions (Addendum)
You may take Excedrin at home for headaches.  Please follow-up with your primary care doctor for continued evaluation of these they may be able to help with medications to treat her headaches at home.  Make sure you are drinking plenty of water.  Return if any of the below scenarios occur.  Get help right away if: Your migraine gets very bad. You have a fever. You have a stiff neck. You have trouble seeing. Your muscles feel weak or like you cannot control them. You start to lose your balance a lot. You start to have trouble walking. You pass out (faint).

## 2017-08-21 NOTE — ED Provider Notes (Signed)
[facility] Provider [facility]   CSN: [id number] Arrival date & time: 08/20/17  2219     History   Chief Complaint Chief Complaint  Patient presents with  . Headache    HPI [name] Taylor is a 32 y.o. female.  [name] Taylor is a 32 y.o. Female with a history of chronic headaches, who presents to the ED for evaluation of headache.  She reports headache across the entire front of her head that is a constant throbbing pain.  She reports pain is worse when she tries to hold her head up.  She is taken Tylenol and Advil without relief.  Has history of chronic headaches and reports this is very similar to headaches she has had in the past, usually she can get them to go away but occasionally has to come to the emergency department for headache treatment.  She denies any associated fevers or neck stiffness.  Reports photophobia and nausea but no vomiting.  Headache was not maximal in intensity at onset and is not the worst headache she is had in her life.  No associated vision changes, dizziness, numbness, weakness or tingling.  No changes in speech or difficulty swallowing.  No chest pain, shortness of breath or abdominal pain.     Past Medical History:  Diagnosis Date  . Burning with urination 10/02/2013  . Chronic headaches   . Chronic leg pain    left lower leg  . Hematuria 10/02/2013  . LLQ pain 10/02/2013  . Vaginal discharge 10/02/2013    Patient Active Problem List   Diagnosis Date Noted  . Burning with urination 10/02/2013  . Hematuria 10/02/2013  . LLQ pain 10/02/2013  . Vaginal discharge 10/02/2013    History reviewed. No pertinent surgical history.   OB History    Gravida  3   Para  3   Term      Preterm  1   AB      Living        SAB      TAB      Ectopic      Multiple      Live Births               Home Medications    Prior to Admission medications   Medication Sig Start Date End Date Taking? Authorizing Provider    cephALEXin (KEFLEX) 500 MG capsule Take 1 capsule (500 mg total) by mouth 4 (four) times daily. Patient not taking: Reported on 07/19/2015 04/12/15   Ward, Layla MawKristen N, DO  cephALEXin (KEFLEX) 500 MG capsule Take 1 capsule (500 mg total) by mouth 4 (four) times daily. Patient not taking: Reported on 07/19/2015 04/25/15   Jaynie CrumbleKirichenko, Tatyana, PA-C  etonogestrel (IMPLANON) 68 MG IMPL implant Inject 1 each into the skin once.      [provider]  HYDROcodone-acetaminophen (NORCO) 5-325 MG tablet Take 1 tablet by mouth every 6 (six) hours as needed for moderate pain. Patient not taking: Reported on 07/19/2015 04/25/15   Jaynie CrumbleKirichenko, Tatyana, PA-C  triamcinolone cream (KENALOG) 0.1 % Apply 1 application topically 2 (two) times daily. 04/13/16   Burgess AmorIdol, Julie, PA-C  promethazine (PHENERGAN) 25 MG tablet Take 1 tablet (25 mg total) by mouth every 6 (six) hours as needed for nausea or vomiting (or headache). Patient not taking: Reported on 04/17/2014 03/31/14 09/10/14  Samuel JesterMcManus, Kathleen, DO    Family History Family History  Problem Relation Age of Onset  . Other Maternal Grandmother  abnormal heart beat    Social History Social History   Tobacco Use  . Smoking status: Current Every Day Smoker    Packs/day: 1.00    Types: Cigarettes  . Smokeless tobacco: Never Used  Substance Use Topics  . Alcohol use: No  . Drug use: No     Allergies   Patient has no known allergies.   Review of Systems Review of Systems  Constitutional: Negative for chills and fever.  HENT: Negative for congestion, ear pain and rhinorrhea.   Eyes: Positive for photophobia. Negative for pain and visual disturbance.  Respiratory: Negative for cough and shortness of breath.   Cardiovascular: Negative for chest pain.  Gastrointestinal: Positive for nausea. Negative for abdominal pain and vomiting.  Musculoskeletal: Negative for arthralgias, myalgias, neck pain and neck stiffness.  Skin: Negative for color change  and rash.  Neurological: Positive for headaches. Negative for dizziness, seizures, syncope, facial asymmetry, speech difficulty, weakness, light-headedness and numbness.  All other systems reviewed and are negative.    Physical Exam Updated Vital Signs BP 111/64   Pulse (!) 51   Temp 98 F (36.7 C) (Oral)   Resp 16   Ht 5\' 1"  (1.549 m)   Wt 63.5 kg   SpO2 99%   BMI 26.45 kg/m   Physical Exam  Constitutional: She is oriented to person, place, and time. She appears well-developed and well-nourished. No distress.  HENT:  Head: Normocephalic and atraumatic.  Mouth/Throat: Oropharynx is clear and moist.  Eyes: Pupils are equal, round, and reactive to light. EOM are normal. Right eye exhibits no discharge. Left eye exhibits no discharge.  Neck: Neck supple.  Cardiovascular: Normal rate, regular rhythm, normal heart sounds and intact distal pulses.  Pulmonary/Chest: Effort normal and breath sounds normal. No respiratory distress. She has no wheezes. She has no rales.  Abdominal: Soft. Bowel sounds are normal. She exhibits no distension and no mass. There is no tenderness. There is no guarding.  Musculoskeletal: She exhibits no edema or deformity.  Neurological: She is alert and oriented to person, place, and time. Coordination normal.  Speech is clear, able to follow commands CN III-XII intact Normal strength in upper and lower extremities bilaterally including dorsiflexion and plantar flexion, strong and equal grip strength Sensation normal to light and sharp touch Moves extremities without ataxia, coordination intact Normal finger to nose and rapid alternating movements No pronator drift  Skin: Skin is warm and dry. Capillary refill takes less than 2 seconds. She is not diaphoretic.  Psychiatric: She has a normal mood and affect. Her behavior is normal.  Nursing note and vitals reviewed.    ED Treatments / Results  Labs (all labs ordered are listed, but only abnormal results  are displayed) Labs Reviewed - No data to display  EKG None  Radiology No results found.  Procedures Procedures (including critical care time)  Medications Ordered in ED Medications  sodium chloride 0.9 % bolus 1,000 mL (1,000 mLs Intravenous New Bag/Given 08/21/17 0023)  ketorolac (TORADOL) 30 MG/ML injection 30 mg (30 mg Intravenous Given 08/21/17 0017)  metoCLOPramide (REGLAN) injection 10 mg (10 mg Intravenous Given 08/21/17 0016)  diphenhydrAMINE (BENADRYL) injection 25 mg (25 mg Intravenous Given 08/21/17 0018)  dexamethasone (DECADRON) injection 10 mg (10 mg Intravenous Given 08/21/17 0016)     Initial Impression / Assessment and Plan / ED Course  I have reviewed the triage vital signs and the nursing notes.  Pertinent labs & imaging results that were available during my care  of the patient were reviewed by me and considered in my medical decision making (see chart for details).  Pt HA treated and improved while in ED.  Presentation is like pts typical HA and non concerning for [facility], ICH, Meningitis, or temporal arteritis. Pt is afebrile with no focal neuro deficits, nuchal rigidity, or change in vision. Pt is to follow up with PCP to discuss prophylactic medication. Pt verbalizes understanding and is agreeable with plan to dc.   Final Clinical Impressions(s) / ED Diagnoses   Final diagnoses:  Bad headache    ED Discharge Orders    None       Dartha Lodge, [place] 08/21/17 0053    Glynn Octave, MD 08/21/17 [phone]

## 2017-08-21 NOTE — ED Notes (Signed)
Pt vomited. edp in with pt

## 2017-08-21 NOTE — ED Triage Notes (Signed)
Pt here last night for headache. Went home and started n/v. States upper abd pain with any movement. None at present. Denies vag dc. Little urinating today. N/v x 10 today.

## 2017-08-22 ENCOUNTER — Encounter (HOSPITAL_COMMUNITY): Payer: Self-pay

## 2017-08-22 ENCOUNTER — Emergency Department (HOSPITAL_COMMUNITY): Payer: BLUE CROSS/BLUE SHIELD

## 2017-08-22 ENCOUNTER — Observation Stay (HOSPITAL_COMMUNITY): Payer: BLUE CROSS/BLUE SHIELD

## 2017-08-22 DIAGNOSIS — R1011 Right upper quadrant pain: Secondary | ICD-10-CM

## 2017-08-22 DIAGNOSIS — Z720 Tobacco use: Secondary | ICD-10-CM

## 2017-08-22 DIAGNOSIS — R112 Nausea with vomiting, unspecified: Secondary | ICD-10-CM | POA: Diagnosis not present

## 2017-08-22 DIAGNOSIS — R109 Unspecified abdominal pain: Secondary | ICD-10-CM

## 2017-08-22 DIAGNOSIS — K760 Fatty (change of) liver, not elsewhere classified: Secondary | ICD-10-CM | POA: Diagnosis not present

## 2017-08-22 DIAGNOSIS — K828 Other specified diseases of gallbladder: Secondary | ICD-10-CM | POA: Diagnosis not present

## 2017-08-22 LAB — CBC
HEMATOCRIT: 33.7 % — AB (ref 36.0–46.0)
HEMOGLOBIN: 11.2 g/dL — AB (ref 12.0–15.0)
MCH: 31.3 pg (ref 26.0–34.0)
MCHC: 33.2 g/dL (ref 30.0–36.0)
MCV: 94.1 fL (ref 78.0–100.0)
Platelets: 198 10*3/uL (ref 150–400)
RBC: 3.58 MIL/uL — ABNORMAL LOW (ref 3.87–5.11)
RDW: 13.1 % (ref 11.5–15.5)
WBC: 15.2 10*3/uL — AB (ref 4.0–10.5)

## 2017-08-22 LAB — COMPREHENSIVE METABOLIC PANEL
ALT: 13 U/L (ref 0–44)
ANION GAP: 6 (ref 5–15)
AST: 15 U/L (ref 15–41)
Albumin: 3.4 g/dL — ABNORMAL LOW (ref 3.5–5.0)
Alkaline Phosphatase: 35 U/L — ABNORMAL LOW (ref 38–126)
BUN: 9 mg/dL (ref 6–20)
CO2: 23 mmol/L (ref 22–32)
Calcium: 7.6 mg/dL — ABNORMAL LOW (ref 8.9–10.3)
Chloride: 111 mmol/L (ref 98–111)
Creatinine, Ser: 0.77 mg/dL (ref 0.44–1.00)
GFR calc Af Amer: >60 mL/min (ref >60)
Glucose, Bld: 107 mg/dL — ABNORMAL HIGH (ref 70–99)
POTASSIUM: 3.5 mmol/L (ref 3.5–5.1)
Sodium: 140 mmol/L (ref 135–145)
Total Bilirubin: 0.6 mg/dL (ref 0.3–1.2)
Total Protein: 6 g/dL — ABNORMAL LOW (ref 6.5–8.1)

## 2017-08-22 LAB — RAPID URINE DRUG SCREEN, HOSP PERFORMED
AMPHETAMINES: NOT DETECTED
BARBITURATES: NOT DETECTED
BENZODIAZEPINES: NOT DETECTED
COCAINE: NOT DETECTED
OPIATES: POSITIVE — AB
TETRAHYDROCANNABINOL: POSITIVE — AB

## 2017-08-22 LAB — MAGNESIUM: Magnesium: 1.8 mg/dL (ref 1.7–2.4)

## 2017-08-22 MED ORDER — NICOTINE 7 MG/24HR TD PT24
7.0000 mg | MEDICATED_PATCH | Freq: Every day | TRANSDERMAL | Status: DC
  Filled 2017-08-22 (×2): qty 1

## 2017-08-22 MED ORDER — ONDANSETRON HCL 4 MG/2ML IJ SOLN
4.0000 mg | Freq: Once | INTRAMUSCULAR | Status: AC
  Administered 2017-08-22: 4 mg via INTRAVENOUS
  Filled 2017-08-22: qty 2

## 2017-08-22 MED ORDER — FENTANYL CITRATE (PF) 100 MCG/2ML IJ SOLN
50.0000 ug | Freq: Once | INTRAMUSCULAR | Status: AC
  Administered 2017-08-22: 50 ug via INTRAVENOUS
  Filled 2017-08-22: qty 2

## 2017-08-22 MED ORDER — ACETAMINOPHEN 325 MG PO TABS
650.0000 mg | ORAL_TABLET | Freq: Four times a day (QID) | ORAL | Status: DC | PRN
  Administered 2017-08-22 – 2017-08-23 (×2): 650 mg via ORAL
  Filled 2017-08-22 (×2): qty 2

## 2017-08-22 MED ORDER — ONDANSETRON HCL 4 MG/2ML IJ SOLN: 4.0000 mg | Freq: Four times a day (QID) | INTRAMUSCULAR | Status: DC | PRN

## 2017-08-22 MED ORDER — KCL IN DEXTROSE-NACL 40-5-0.9 MEQ/L-%-% IV SOLN
INTRAVENOUS | Status: DC
  Administered 2017-08-22: 05:00:00 via INTRAVENOUS
  Filled 2017-08-22 (×2): qty 1000

## 2017-08-22 MED ORDER — KCL IN DEXTROSE-NACL 40-5-0.9 MEQ/L-%-% IV SOLN
INTRAVENOUS | Status: AC
  Administered 2017-08-22 (×2): via INTRAVENOUS

## 2017-08-22 MED ORDER — FENTANYL CITRATE (PF) 100 MCG/2ML IJ SOLN
100.0000 ug | Freq: Once | INTRAMUSCULAR | Status: AC
  Administered 2017-08-22: 100 ug via INTRAVENOUS
  Filled 2017-08-22: qty 2

## 2017-08-22 MED ORDER — IOPAMIDOL (ISOVUE-300) INJECTION 61%
100.0000 mL | Freq: Once | INTRAVENOUS | Status: AC | PRN
  Administered 2017-08-22: 100 mL via INTRAVENOUS

## 2017-08-22 MED ORDER — PROMETHAZINE HCL 25 MG/ML IJ SOLN: 12.5000 mg | Freq: Four times a day (QID) | INTRAMUSCULAR | Status: DC | PRN

## 2017-08-22 MED ORDER — MORPHINE SULFATE (PF) 4 MG/ML IV SOLN
4.0000 mg | Freq: Once | INTRAVENOUS | Status: AC
  Administered 2017-08-22: 4 mg via INTRAVENOUS
  Filled 2017-08-22: qty 1

## 2017-08-22 MED ORDER — ENOXAPARIN SODIUM 40 MG/0.4ML ~~LOC~~ SOLN
40.0000 mg | SUBCUTANEOUS | Status: DC
  Administered 2017-08-22 – 2017-08-23 (×2): 40 mg via SUBCUTANEOUS
  Filled 2017-08-22 (×2): qty 0.4

## 2017-08-22 MED ORDER — NICOTINE 21 MG/24HR TD PT24
21.0000 mg | MEDICATED_PATCH | Freq: Every day | TRANSDERMAL | Status: DC
  Administered 2017-08-22 – 2017-08-23 (×2): 21 mg via TRANSDERMAL
  Filled 2017-08-22 (×2): qty 1

## 2017-08-22 MED ORDER — ACETAMINOPHEN 650 MG RE SUPP: 650.0000 mg | Freq: Four times a day (QID) | RECTAL | Status: DC | PRN

## 2017-08-22 MED ORDER — ONDANSETRON HCL 4 MG PO TABS: 4.0000 mg | ORAL_TABLET | Freq: Four times a day (QID) | ORAL | Status: DC | PRN

## 2017-08-22 MED ORDER — ENOXAPARIN SODIUM 40 MG/0.4ML ~~LOC~~ SOLN: 40.0000 mg | SUBCUTANEOUS | Status: DC

## 2017-08-22 MED ORDER — ONDANSETRON HCL 4 MG/2ML IJ SOLN
4.0000 mg | Freq: Four times a day (QID) | INTRAMUSCULAR | Status: DC
  Administered 2017-08-22 – 2017-08-23 (×5): 4 mg via INTRAVENOUS
  Filled 2017-08-22 (×5): qty 2

## 2017-08-22 MED ORDER — HYDROMORPHONE HCL 1 MG/ML IJ SOLN
0.5000 mg | INTRAMUSCULAR | Status: DC | PRN
  Administered 2017-08-22 – 2017-08-23 (×4): 0.5 mg via INTRAVENOUS
  Filled 2017-08-22 (×4): qty 0.5

## 2017-08-22 MED ORDER — SODIUM CHLORIDE 0.9 % IV BOLUS (SEPSIS)
1000.0000 mL | Freq: Once | INTRAVENOUS | Status: AC
  Administered 2017-08-22: 1000 mL via INTRAVENOUS

## 2017-08-22 NOTE — ED Notes (Signed)
Pt taken to ct 

## 2017-08-22 NOTE — ED Provider Notes (Signed)
[facility] Provider Note   CSN: [id number] Arrival date & time: 08/21/17  2146     History   Chief Complaint Chief Complaint  Patient presents with  . Abdominal Pain  . Emesis    HPI [name] Taylor is a 32 y.o. female.  The history is provided by the patient.  Abdominal Pain   This is a new problem. The current episode started 6 to 12 hours ago. The problem occurs constantly. The problem has been rapidly worsening. The pain is located in the epigastric region and RUQ. The pain is severe. Associated symptoms include nausea and vomiting. Pertinent negatives include fever and diarrhea. The symptoms are aggravated by palpation. Nothing relieves the symptoms.  Emesis   Associated symptoms include abdominal pain. Pertinent negatives include no diarrhea and no fever.   Patient with history of chronic headaches presents for abdominal pain.  She reports over the past day she is been having severe upper abdominal pain with associated nausea vomiting.  Denies any bloody emesis.  She has never had this pain before.  She was seen in the ER recently for headache, but she reports that headache is resolved. Past Medical History:  Diagnosis Date  . Burning with urination 10/02/2013  . Chronic headaches   . Chronic leg pain    left lower leg  . Hematuria 10/02/2013  . LLQ pain 10/02/2013  . Vaginal discharge 10/02/2013    Patient Active Problem List   Diagnosis Date Noted  . Burning with urination 10/02/2013  . Hematuria 10/02/2013  . LLQ pain 10/02/2013  . Vaginal discharge 10/02/2013    History reviewed. No pertinent surgical history.   OB History    Gravida  3   Para  3   Term      Preterm  1   AB      Living        SAB      TAB      Ectopic      Multiple      Live Births               Home Medications    Prior to Admission medications   Medication Sig Start Date End Date Taking? Authorizing Provider  acetaminophen (TYLENOL) 500 MG  tablet Take 500 mg by mouth every 6 (six) hours as needed.   Yes [provider]  etonogestrel (IMPLANON) 68 MG IMPL implant Inject 1 each into the skin once.     Yes [provider]    Family History Family History  Problem Relation Age of Onset  . Other Maternal Grandmother        abnormal heart beat    Social History Social History   Tobacco Use  . Smoking status: Current Every Day Smoker    Packs/day: 1.00    Types: Cigarettes  . Smokeless tobacco: Never Used  Substance Use Topics  . Alcohol use: No  . Drug use: No     Allergies   Patient has no known allergies.   Review of Systems Review of Systems  Constitutional: Negative for fever.  Respiratory: Positive for shortness of breath.   Gastrointestinal: Positive for abdominal pain, nausea and vomiting. Negative for diarrhea.  Genitourinary: Positive for decreased urine volume. Negative for vaginal discharge.  All other systems reviewed and are negative.    Physical Exam Updated Vital Signs BP 101/63   Pulse (!) 52   Temp 98.2 F (36.8 C) (Oral)   Resp  17   SpO2 98%   Physical Exam  CONSTITUTIONAL: Well developed/well nourished, ill-appearing HEAD: Normocephalic/atraumatic EYES: EOMI/PERRL, no icterus ENMT: Mucous membranes moist NECK: supple no meningeal signs SPINE/BACK:entire spine nontender CV: S1/S2 noted, no murmurs/rubs/gallops noted LUNGS: Lungs are clear to auscultation bilaterally, no apparent distress ABDOMEN: soft, significant tenderness to right upper quadrant and epigastric region, no rebound or guarding, bowel sounds noted throughout abdomen GU:no cva tenderness NEURO: Pt is awake/alert/appropriate, moves all extremitiesx4.  No facial droop.   EXTREMITIES: pulses normal/equal, full ROM SKIN: warm, color normal PSYCH: no abnormalities of mood noted, alert and oriented to situation  ED Treatments / Results  Labs (all labs ordered are listed, but only abnormal results  are displayed) Labs Reviewed  CBC WITH DIFFERENTIAL/PLATELET - Abnormal; Notable for the following components:      Result Value   WBC 20.5 (*)    RBC 3.85 (*)    Hemoglobin 11.9 (*)    HCT 35.7 (*)    Neutro Abs 16.0 (*)    Monocytes Absolute 1.2 (*)    All other components within normal limits  BASIC METABOLIC PANEL - Abnormal; Notable for the following components:   Potassium 3.3 (*)    Glucose, Bld 116 (*)    Calcium 8.1 (*)    All other components within normal limits  URINALYSIS, ROUTINE W REFLEX MICROSCOPIC - Abnormal; Notable for the following components:   APPearance HAZY (*)    Specific Gravity, Urine 1.031 (*)    Hgb urine dipstick SMALL (*)    All other components within normal limits  PREGNANCY, URINE  HEPATIC FUNCTION PANEL  LIPASE, BLOOD    EKG None  Radiology Ct Abdomen Pelvis W Contrast  Result Date: 08/22/2017 CLINICAL DATA:  Acute abdominal pain.  Nausea and vomiting. EXAM: CT ABDOMEN AND PELVIS WITH CONTRAST TECHNIQUE: Multidetector CT imaging of the abdomen and pelvis was performed using the standard protocol following bolus administration of intravenous contrast. CONTRAST:  100mL ISOVUE-300 IOPAMIDOL (ISOVUE-300) INJECTION 61% COMPARISON:  CT 07/27/2005 FINDINGS: Lower chest: Lung bases are clear. Hepatobiliary: Slight decreased hepatic density suggesting steatosis. Geographic area of more increased density in the left lobe of the liver. Gallbladder physiologically distended, no calcified stone. No biliary dilatation. Pancreas: No ductal dilatation or inflammation. Spleen: Normal in size without focal abnormality. Adrenals/Urinary Tract: No adrenal nodule. No hydronephrosis or perinephric edema. Homogeneous renal enhancement. Urinary bladder is decompressed without wall thickening. Stomach/Bowel: Stomach is within normal limits. Appendix appears normal, for example image 62 series 2. No evidence of bowel wall thickening, distention, or inflammatory changes.  Vascular/Lymphatic: Prominent adnexal vascularity and dilatation of the left ovarian vein of 9 mm. Normal caliber abdominal aorta. No abdominopelvic adenopathy. Reproductive: 3.1 cm right ovarian cyst. Left ovary is unremarkable. Prominent adnexal and periuterine vascularity, left greater than right. Other: Small amount of free fluid in the pelvis. No free air or intra-abdominal abscess. Musculoskeletal: There are no acute or suspicious osseous abnormalities. IMPRESSION: 1. Simple right ovarian cyst measuring 3.1 cm. This is likely incidental, and no dedicated imaging follow-up is needed. 2. Prominent adnexal vascularity and dilatation of the left ovarian vein, which can be seen with pelvic congestion syndrome. 3. Hepatic steatosis. Geographic area of relative increased density in the left lobe felt to be an area of focal fatty sparing. Electronically Signed   By: Rubye OaksMelanie  Ehinger M.D.   On: 08/22/2017 01:46    Procedures Procedures  Medications Ordered in ED Medications  ondansetron (ZOFRAN) injection 4 mg (4 mg  Intravenous Given 08/21/17 2332)  fentaNYL (SUBLIMAZE) injection 50 mcg (50 mcg Intravenous Given 08/21/17 2332)  sodium chloride 0.9 % bolus 1,000 mL (0 mLs Intravenous Stopped 08/21/17 2333)  sodium chloride 0.9 % bolus 1,000 mL (0 mLs Intravenous Stopped 08/22/17 0127)  fentaNYL (SUBLIMAZE) injection 50 mcg (50 mcg Intravenous Given 08/22/17 0022)  iopamidol (ISOVUE-300) 61 % injection 100 mL (100 mLs Intravenous Contrast Given 08/22/17 0025)  fentaNYL (SUBLIMAZE) injection 100 mcg (100 mcg Intravenous Given 08/22/17 0218)  ondansetron (ZOFRAN) injection 4 mg (4 mg Intravenous Given 08/22/17 0218)  morphine 4 MG/ML injection 4 mg (4 mg Intravenous Given 08/22/17 0253)  ondansetron (ZOFRAN) injection 4 mg (4 mg Intravenous Given 08/22/17 0253)     Initial Impression / Assessment and Plan / ED Course  I have reviewed the triage vital signs and the nursing notes.  Pertinent labs  results that  were available during my care of the patient were reviewed by me and considered in my medical decision making (see chart for details).    4:06 AM PT with epigastric and right upper quadrant abdominal pain for several hours.  She has associated nausea vomiting. After multiple rounds of medications including Zofran, fentanyl and morphine patient is still having pain is still vomiting CT imaging does not reveal any acute abdominal emergency, I am still strongly suspicious for cholelithiasis versus cholecystitis given location of pain, intractable vomiting as well as elevated white count.  Due to inability to take p.o. fluids, patient will be admitted for observation.  She will also need to have a right upper quadrant ultrasound done later today.  Discussed with Dr. Sherryll Burger for admission.  This was discussed with patient and she agrees  Final Clinical Impressions(s) / ED Diagnoses   Final diagnoses:  Intractable cyclical vomiting with nausea  Right upper quadrant abdominal pain    ED Discharge Orders    None       Zadie Rhine, MD 08/22/17 [phone]

## 2017-08-22 NOTE — H&P (Signed)
History and Physical    [name] BlackbirdMegan R Taylor GNF:[id number] DOB: 11-10-1985 DOA: 08/21/2017  PCP: Babs SciaraLuking, Scott A, MD   Patient coming from: Home  Chief Complaint: N/V/abdominal pain  HPI: [name] Taylor is a 32 y.o. female with medical history significant for ongoing tobacco abuse who presented to the emergency department with sudden onset right upper quadrant abdominal pain that began at approximately 3 AM yesterday.  Her pain has been persistent and is worsened with anything that she tries to eat.  She states that she gets some relief after she has some vomiting.  She has had ongoing nausea with multiple episodes of emesis.  She denies any fever or diarrhea but has had some chills.  Her symptoms appear to be worsened with any movement or palpation as well.  She has never had pain such as this previously.  She denies hematemesis.   ED Course: Vital signs stable.  Laboratory data with leukocytosis of 20,500, potassium 3.3, glucose 116.  Urine analysis negative for any findings.  Urine pregnancy screen negative.  CT of the abdomen pelvis with contrast performed demonstrating gallbladder distention and incidental right ovarian cyst.  There appears to be hepatic steatosis as well.  LFTs are within the normal range.  She has been given multiple doses of fentanyl, Zofran, and 2 L of IV fluid with some moderate improvement in her symptoms.  Review of Systems: All others reviewed and otherwise negative.  Past Medical History:  Diagnosis Date  . Burning with urination 10/02/2013  . Chronic headaches   . Chronic leg pain    left lower leg  . Hematuria 10/02/2013  . LLQ pain 10/02/2013  . Vaginal discharge 10/02/2013    History reviewed. No pertinent surgical history.   reports that she has been smoking cigarettes. She has been smoking about 1.00 pack per day. She has never used smokeless tobacco. She reports that she does not drink alcohol or use drugs.  No Known Allergies  Family History  Problem  Relation Age of Onset  . Other Maternal Grandmother        abnormal heart beat    Prior to Admission medications   Medication Sig Start Date End Date Taking? Authorizing Provider  acetaminophen (TYLENOL) 500 MG tablet Take 500 mg by mouth every 6 (six) hours as needed.   Yes [provider]  etonogestrel (IMPLANON) 68 MG IMPL implant Inject 1 each into the skin once.     Yes [provider]    Physical Exam: Vitals:   08/22/17 0220 08/22/17 0230 08/22/17 0251 08/22/17 0310  BP:  (!) 100/54 (!) 107/55 (!) 100/54  Pulse:  (!) 55 (!) 59 (!) 55  Resp:      Temp: 98.2 F (36.8 C)     TempSrc: Oral     SpO2:  94% 97% 97%    Constitutional: NAD, calm, comfortable Vitals:   08/22/17 0220 08/22/17 0230 08/22/17 0251 08/22/17 0310  BP:  (!) 100/54 (!) 107/55 (!) 100/54  Pulse:  (!) 55 (!) 59 (!) 55  Resp:      Temp: 98.2 F (36.8 C)     TempSrc: Oral     SpO2:  94% 97% 97%   Eyes: lids and conjunctivae normal ENMT: Mucous membranes are moist.  Neck: normal, supple Respiratory: clear to auscultation bilaterally. Normal respiratory effort. No accessory muscle use.  Cardiovascular: Regular rate and rhythm, no murmurs. No extremity edema. Abdomen: Minimal tenderness to palpation of her right upper quadrant with negative  Murphy sign, no distention. Bowel sounds positive.  No guarding, rigidity, or rebound. Musculoskeletal:  No joint deformity upper and lower extremities.   Skin: no rashes, lesions, ulcers.  Psychiatric: Normal judgment and insight. Alert and oriented x 3. Normal mood.   Labs on Admission: I have personally reviewed following labs and imaging studies  CBC: Recent Labs  Lab 08/21/17 2232  WBC 20.5*  NEUTROABS 16.0*  HGB 11.9*  HCT 35.7*  MCV 92.7  PLT 246   Basic Metabolic Panel: Recent Labs  Lab 08/21/17 2232  NA 138  K 3.3*  CL 109  CO2 23  GLUCOSE 116*  BUN 11  CREATININE 0.77  CALCIUM 8.1*   GFR: Estimated Creatinine  Clearance: 87.8 mL/min (by C-G formula based on SCr of 0.77 mg/dL). Liver Function Tests: Recent Labs  Lab 08/21/17 2232  AST 19  ALT 14  ALKPHOS 42  BILITOT 0.6  PROT 6.8  ALBUMIN 3.9   Recent Labs  Lab 08/21/17 2232  LIPASE 26   No results for input(s): AMMONIA in the last 168 hours. Coagulation Profile: No results for input(s): INR, PROTIME in the last 168 hours. Cardiac Enzymes: No results for input(s): CKTOTAL, CKMB, CKMBINDEX, TROPONINI in the last 168 hours. BNP (last 3 results) No results for input(s): PROBNP in the last 8760 hours. HbA1C: No results for input(s): HGBA1C in the last 72 hours. CBG: No results for input(s): GLUCAP in the last 168 hours. Lipid Profile: No results for input(s): CHOL, HDL, LDLCALC, TRIG, CHOLHDL, LDLDIRECT in the last 72 hours. Thyroid Function Tests: No results for input(s): TSH, T4TOTAL, FREET4, T3FREE, THYROIDAB in the last 72 hours. Anemia Panel: No results for input(s): VITAMINB12, FOLATE, FERRITIN, TIBC, IRON, RETICCTPCT in the last 72 hours. Urine analysis:    Component Value Date/Time   COLORURINE YELLOW 08/21/2017 2217   APPEARANCEUR HAZY (A) 08/21/2017 2217   LABSPEC 1.031 (H) 08/21/2017 2217   PHURINE 5.0 08/21/2017 2217   GLUCOSEU NEGATIVE 08/21/2017 2217   HGBUR SMALL (A) 08/21/2017 2217   BILIRUBINUR NEGATIVE 08/21/2017 2217   KETONESUR NEGATIVE 08/21/2017 2217   PROTEINUR NEGATIVE 08/21/2017 2217   UROBILINOGEN 0.2 10/02/2013 1540   NITRITE NEGATIVE 08/21/2017 2217   LEUKOCYTESUR NEGATIVE 08/21/2017 2217    Radiological Exams on Admission: Ct Abdomen Pelvis W Contrast  Result Date: 08/22/2017 CLINICAL DATA:  Acute abdominal pain.  Nausea and vomiting. EXAM: CT ABDOMEN AND PELVIS WITH CONTRAST TECHNIQUE: Multidetector CT imaging of the abdomen and pelvis was performed using the standard protocol following bolus administration of intravenous contrast. CONTRAST:  100mL ISOVUE-300 IOPAMIDOL (ISOVUE-300) INJECTION  61% COMPARISON:  CT 07/27/2005 FINDINGS: Lower chest: Lung bases are clear. Hepatobiliary: Slight decreased hepatic density suggesting steatosis. Geographic area of more increased density in the left lobe of the liver. Gallbladder physiologically distended, no calcified stone. No biliary dilatation. Pancreas: No ductal dilatation or inflammation. Spleen: Normal in size without focal abnormality. Adrenals/Urinary Tract: No adrenal nodule. No hydronephrosis or perinephric edema. Homogeneous renal enhancement. Urinary bladder is decompressed without wall thickening. Stomach/Bowel: Stomach is within normal limits. Appendix appears normal, for example image 62 series 2. No evidence of bowel wall thickening, distention, or inflammatory changes. Vascular/Lymphatic: Prominent adnexal vascularity and dilatation of the left ovarian vein of 9 mm. Normal caliber abdominal aorta. No abdominopelvic adenopathy. Reproductive: 3.1 cm right ovarian cyst. Left ovary is unremarkable. Prominent adnexal and periuterine vascularity, left greater than right. Other: Small amount of free fluid in the pelvis. No free air or intra-abdominal abscess. Musculoskeletal:  There are no acute or suspicious osseous abnormalities. IMPRESSION: 1. Simple right ovarian cyst measuring 3.1 cm. This is likely incidental, and no dedicated imaging follow-up is needed. 2. Prominent adnexal vascularity and dilatation of the left ovarian vein, which can be seen with pelvic congestion syndrome. 3. Hepatic steatosis. Geographic area of relative increased density in the left lobe felt to be an area of focal fatty sparing. Electronically Signed   By: Rubye Oaks M.D.   On: 08/22/2017 01:46    Assessment/Plan Principal Problem:   Intractable nausea and vomiting Active Problems:   Tobacco abuse    1. Intractable nausea and vomiting.  Suspect gallbladder/biliary etiology, but with no LFT elevation or findings on CT scan thus far.  Will order right upper  quadrant ultrasound for better imaging.  Continue symptomatic treatment with Zofran which has already helped.  Will start clear liquid diet for now and could be advanced as tolerated.  Maintain on IV fluid.  Consider further evaluation with HIDA and/or general surgery evaluation if symptoms persist.  Will repeat CMP this a.m. as well as CBC. 2. Leukocytosis.  This is likely secondary to above.  I do not feel that there is any infectious etiology at this time and therefore will avoid IV antibiotics. 3. Mild hypokalemia.  Replete with IV and monitor labs.  Check magnesium. 4. Tobacco abuse.  Cessation counseling.  Nicotine patch.   DVT prophylaxis: Lovenox Code Status: Full Family Communication: None at bedside Disposition Plan:Evaluation of N/V Consults called:None Admission status: Observation, Med-Surg   Doyle Kunath Hoover Brunette DO [facility] Pager [phone]  If 7PM-7AM, please contact night-coverage [email or web address] Password [facility]  08/22/2017, 4:17 AM

## 2017-08-22 NOTE — ED Notes (Signed)
Report given to Heather RN on 300. 

## 2017-08-22 NOTE — ED Notes (Signed)
Ice chips given

## 2017-08-22 NOTE — Progress Notes (Addendum)
PROGRESS NOTE  [name] Taylor WUJ:[id number] DOB: [birth year] DOA: 08/21/2017 PCP: Babs SciaraLuking, Scott A, MD  Brief History:  [age] year old female with a history of tobacco abuse and chronic headaches presenting with 1 day history of epigastric pain, right upper quadrant pain, and intractable vomiting.  The patient stated that she woke up around 3 AM on 08/21/2017 with the presenting above symptoms.  She denies fever, chills, chest pain, shortness breath, diarrhea.  There is no hematemesis.  The patient denies any recent travels, new medications, or sick contacts.  She denies any alcohol use or illicit drugs.  There is no dysuria hematuria.  In the ED, CT of the abdomen and pelvis was performed and showed hepatic steatosis, physiologically distended gallbladder without ductal dilatation, and a simple right ovarian cyst.  WBC was 20.5, but BMP and LFTs were essentially unremarkable.  Lipase was 26.  Urinalysis was negative for pyuria.  Patient was admitted for further work-up.  Assessment/Plan: Intractable nausea, vomiting -Hepatobiliary etiology versus cyclic vomiting syndrome (THC) vs nonspecific gastritis -Around-the-clock Zofran -Continue IV fluids -Right upper quadrant ultrasound -RUQ US--mild GB thickening without other signs of acute cholecystitis -UA negative for pyuria -Lipase 26 -clear liquids for now -UDS-->+THC -start PPI -HIDA scan if no improvement  Tobacco abuse I have discussed tobacco cessation with the patient.  I have counseled the patient regarding the negative impacts of continued tobacco use including but not limited to lung cancer, COPD, and cardiovascular disease.  I have discussed alternatives to tobacco and modalities that may help facilitate tobacco cessation including but not limited to biofeedback, hypnosis, and medications.  Total time spent with tobacco counseling was 4 minutes.  Hypokalemia -Secondary to GI loss -Replete -Magnesium 1.8     Disposition  Plan:   Home in 1-2 days  Family Communication:   Family at bedside--Total time spent 35 minutes.  Greater than 50% spent face to face counseling and coordinating care.   Consultants:  none Code Status:  FULL   DVT Prophylaxis:  [place] Lovenox   Procedures: As Listed in Progress Note Above  Antibiotics: None    Subjective: Patient continues to have dry heaves in the emergency department.  She is having difficulty tolerating any oral intake.  She denies any hematemesis, chest pain or shortness breath, dysuria, hematuria.  She continues to complain of epigastric and right upper quadrant pain.  There is no headache, chest pain, shortness breath, fevers, chills.  Objective: Vitals:   08/22/17 0330 08/22/17 0400 08/22/17 0430 08/22/17 0630  BP: (!) 95/50 103/64 (!) 100/58 101/70  Pulse: 62 (!) 52 (!) 52 (!) 48  Resp:    13  Temp:      TempSrc:      SpO2: 99% 99% 98% 98%    Intake/Output Summary (Last 24 hours) at 08/22/2017 [id number] Last data filed at 08/22/2017 0127 Gross per 24 hour  Intake 2000 ml  Output -  Net 2000 ml   Weight change:  Exam:   General:  Pt is alert, follows commands appropriately, not in acute distress  HEENT: No icterus, No thrush, No neck mass, [place]/AT  Cardiovascular: RRR, S1/S2, no rubs, no gallops  Respiratory: CTA bilaterally, no wheezing, no crackles, no rhonchi  Abdomen: Soft/+BS, epigastric tender, non distended, no guarding  Extremities: No edema, No lymphangitis, No petechiae, No rashes, no synovitis   Data Reviewed: I have personally reviewed following labs and imaging studies Basic Metabolic Panel: Recent Labs  Lab  08/21/17 2232 08/22/17 0506  NA 138 140  K 3.3* 3.5  CL 109 111  CO2 23 23  GLUCOSE 116* 107*  BUN 11 9  CREATININE 0.77 0.77  CALCIUM 8.1* 7.6*  MG  --  1.8   Liver Function Tests: Recent Labs  Lab 08/21/17 2232 08/22/17 0506  AST 19 15  ALT 14 13  ALKPHOS 42 35*  BILITOT 0.6 0.6  PROT 6.8 6.0*  ALBUMIN  3.9 3.4*   Recent Labs  Lab 08/21/17 2232  LIPASE 26   No results for input(s): AMMONIA in the last 168 hours. Coagulation Profile: No results for input(s): INR, PROTIME in the last 168 hours. CBC: Recent Labs  Lab 08/21/17 2232 08/22/17 0506  WBC 20.5* 15.2*  NEUTROABS 16.0*  --   HGB 11.9* 11.2*  HCT 35.7* 33.7*  MCV 92.7 94.1  PLT 246 198   Cardiac Enzymes: No results for input(s): CKTOTAL, CKMB, CKMBINDEX, TROPONINI in the last 168 hours. BNP: Invalid input(s): POCBNP CBG: No results for input(s): GLUCAP in the last 168 hours. HbA1C: No results for input(s): HGBA1C in the last 72 hours. Urine analysis:    Component Value Date/Time   COLORURINE YELLOW 08/21/2017 2217   APPEARANCEUR HAZY (A) 08/21/2017 2217   LABSPEC 1.031 (H) 08/21/2017 2217   PHURINE 5.0 08/21/2017 2217   GLUCOSEU NEGATIVE 08/21/2017 2217   HGBUR SMALL (A) 08/21/2017 2217   BILIRUBINUR NEGATIVE 08/21/2017 2217   KETONESUR NEGATIVE 08/21/2017 2217   PROTEINUR NEGATIVE 08/21/2017 2217   UROBILINOGEN 0.2 10/02/2013 1540   NITRITE NEGATIVE 08/21/2017 2217   LEUKOCYTESUR NEGATIVE 08/21/2017 2217   Sepsis Labs: @LABRCNTIP (procalcitonin:4,lacticidven:4) )No results found for this or any previous visit (from the past 240 hour(s)).   Scheduled Meds: . enoxaparin (LOVENOX) injection  40 mg Subcutaneous Q24H  . nicotine  7 mg Transdermal Daily   Continuous Infusions: . dextrose 5 % and 0.9 % NaCl with KCl 40 mEq/L 75 mL/hr at 08/22/17 1610    Procedures/Studies: Ct Abdomen Pelvis W Contrast  Result Date: 08/22/2017 CLINICAL DATA:  Acute abdominal pain.  Nausea and vomiting. EXAM: CT ABDOMEN AND PELVIS WITH CONTRAST TECHNIQUE: Multidetector CT imaging of the abdomen and pelvis was performed using the standard protocol following bolus administration of intravenous contrast. CONTRAST:  ISOVUE-300 IOPAMIDOL (ISOVUE-300) INJECTION 61% COMPARISON:  CT 07/27/2005 FINDINGS: Lower chest: Lung bases  are clear. Hepatobiliary: Slight decreased hepatic density suggesting steatosis. Geographic area of more increased density in the left lobe of the liver. Gallbladder physiologically distended, no calcified stone. No biliary dilatation. Pancreas: No ductal dilatation or inflammation. Spleen: Normal in size without focal abnormality. Adrenals/Urinary Tract: No adrenal nodule. No hydronephrosis or perinephric edema. Homogeneous renal enhancement. Urinary bladder is decompressed without wall thickening. Stomach/Bowel: Stomach is within normal limits. Appendix appears normal, for example image 62 series 2. No evidence of bowel wall thickening, distention, or inflammatory changes. Vascular/Lymphatic: Prominent adnexal vascularity and dilatation of the left ovarian vein of 9 mm. Normal caliber abdominal aorta. No abdominopelvic adenopathy. Reproductive: 3.1 cm right ovarian cyst. Left ovary is unremarkable. Prominent adnexal and periuterine vascularity, left greater than right. Other: Small amount of free fluid in the pelvis. No free air or intra-abdominal abscess. Musculoskeletal: There are no acute or suspicious osseous abnormalities. IMPRESSION: 1. Simple right ovarian cyst measuring 3.1 cm. This is likely incidental, and no dedicated imaging follow-up is needed. 2. Prominent adnexal vascularity and dilatation of the left ovarian vein, which can be seen with pelvic congestion syndrome. 3.  Hepatic steatosis. Geographic area of relative increased density in the left lobe felt to be an area of focal fatty sparing. Electronically Signed   By: Rubye OaksMelanie  Ehinger M.D.   On: 08/22/2017 01:46    Catarina Hartshornavid William Laske, DO  [facility] Pager [phone]  If 7PM-7AM, please contact night-coverage [email or web address] Password [email or web address] 08/22/2017, 7:11 AM   LOS: 0 days

## 2017-08-23 ENCOUNTER — Other Ambulatory Visit: Payer: Self-pay

## 2017-08-23 ENCOUNTER — Encounter (HOSPITAL_COMMUNITY): Payer: Self-pay | Admitting: Emergency Medicine

## 2017-08-23 ENCOUNTER — Emergency Department (HOSPITAL_COMMUNITY): Payer: BLUE CROSS/BLUE SHIELD

## 2017-08-23 ENCOUNTER — Emergency Department (HOSPITAL_COMMUNITY)
Admission: EM | Admit: 2017-08-23 | Discharge: 2017-08-24 | Disposition: A | Discharge status: Home / Self Care | Payer: BLUE CROSS/BLUE SHIELD | Attending: Emergency Medicine | Admitting: Emergency Medicine

## 2017-08-23 DIAGNOSIS — R195 Other fecal abnormalities: Secondary | ICD-10-CM | POA: Diagnosis not present

## 2017-08-23 DIAGNOSIS — Z720 Tobacco use: Secondary | ICD-10-CM

## 2017-08-23 DIAGNOSIS — Z79899 Other long term (current) drug therapy: Secondary | ICD-10-CM | POA: Insufficient documentation

## 2017-08-23 DIAGNOSIS — R112 Nausea with vomiting, unspecified: Secondary | ICD-10-CM | POA: Diagnosis not present

## 2017-08-23 DIAGNOSIS — F17210 Nicotine dependence, cigarettes, uncomplicated: Secondary | ICD-10-CM | POA: Insufficient documentation

## 2017-08-23 DIAGNOSIS — R1011 Right upper quadrant pain: Secondary | ICD-10-CM | POA: Insufficient documentation

## 2017-08-23 DIAGNOSIS — R1010 Upper abdominal pain, unspecified: Secondary | ICD-10-CM

## 2017-08-23 DIAGNOSIS — Z029 Encounter for administrative examinations, unspecified: Secondary | ICD-10-CM

## 2017-08-23 HISTORY — DX: Cannabis use, unspecified, uncomplicated: F12.90

## 2017-08-23 LAB — RAPID URINE DRUG SCREEN, HOSP PERFORMED
AMPHETAMINES: NOT DETECTED
BENZODIAZEPINES: NOT DETECTED
Barbiturates: NOT DETECTED
COCAINE: NOT DETECTED
OPIATES: NOT DETECTED
TETRAHYDROCANNABINOL: POSITIVE — AB

## 2017-08-23 LAB — CBC WITH DIFFERENTIAL/PLATELET
BASOS PCT: 0 %
Basophils Absolute: 0.1 10*3/uL (ref 0.0–0.1)
EOS ABS: 0.1 10*3/uL (ref 0.0–0.7)
Eosinophils Relative: 1 %
HEMATOCRIT: 39.4 % (ref 36.0–46.0)
HEMOGLOBIN: 13.3 g/dL (ref 12.0–15.0)
Lymphocytes Relative: 22 %
Lymphs Abs: 3.3 10*3/uL (ref 0.7–4.0)
MCH: 31.2 pg (ref 26.0–34.0)
MCHC: 33.8 g/dL (ref 30.0–36.0)
MCV: 92.5 fL (ref 78.0–100.0)
Monocytes Absolute: 0.9 10*3/uL (ref 0.1–1.0)
Monocytes Relative: 6 %
NEUTROS ABS: 10.5 10*3/uL — AB (ref 1.7–7.7)
NEUTROS PCT: 71 %
Platelets: 253 10*3/uL (ref 150–400)
RBC: 4.26 MIL/uL (ref 3.87–5.11)
RDW: 12.7 % (ref 11.5–15.5)
WBC: 14.9 10*3/uL — AB (ref 4.0–10.5)

## 2017-08-23 LAB — COMPREHENSIVE METABOLIC PANEL
ALBUMIN: 3.1 g/dL — AB (ref 3.5–5.0)
ALBUMIN: 4.4 g/dL (ref 3.5–5.0)
ALK PHOS: 32 U/L — AB (ref 38–126)
ALK PHOS: 45 U/L (ref 38–126)
ALT: 17 U/L (ref 0–44)
ALT: 34 U/L (ref 0–44)
ANION GAP: 8 (ref 5–15)
AST: 13 U/L — AB (ref 15–41)
AST: 28 U/L (ref 15–41)
Anion gap: 6 (ref 5–15)
BILIRUBIN TOTAL: 0.6 mg/dL (ref 0.3–1.2)
BILIRUBIN TOTAL: 0.6 mg/dL (ref 0.3–1.2)
BUN: 5 mg/dL — AB (ref 6–20)
BUN: 6 mg/dL (ref 6–20)
CALCIUM: 8.7 mg/dL — AB (ref 8.9–10.3)
CO2: 24 mmol/L (ref 22–32)
CO2: 25 mmol/L (ref 22–32)
Calcium: 8.2 mg/dL — ABNORMAL LOW (ref 8.9–10.3)
Chloride: 107 mmol/L (ref 98–111)
Chloride: 105 mmol/L (ref 98–111)
Creatinine, Ser: 0.75 mg/dL (ref 0.44–1.00)
Creatinine, Ser: 0.78 mg/dL (ref 0.44–1.00)
GFR calc Af Amer: >60 mL/min (ref >60)
GFR calc non Af Amer: >60 mL/min (ref >60)
GFR calc non Af Amer: >60 mL/min (ref >60)
Glucose, Bld: 96 mg/dL (ref 70–99)
Glucose, Bld: 104 mg/dL — ABNORMAL HIGH (ref 70–99)
POTASSIUM: 4.1 mmol/L (ref 3.5–5.1)
POTASSIUM: 3.4 mmol/L — AB (ref 3.5–5.1)
SODIUM: 138 mmol/L (ref 135–145)
Sodium: 137 mmol/L (ref 135–145)
TOTAL PROTEIN: 5.5 g/dL — AB (ref 6.5–8.1)
TOTAL PROTEIN: 7.4 g/dL (ref 6.5–8.1)

## 2017-08-23 LAB — CBC
HEMATOCRIT: 34.3 % — AB (ref 36.0–46.0)
HEMOGLOBIN: 11.2 g/dL — AB (ref 12.0–15.0)
MCH: 30.9 pg (ref 26.0–34.0)
MCHC: 32.7 g/dL (ref 30.0–36.0)
MCV: 94.5 fL (ref 78.0–100.0)
Platelets: 188 10*3/uL (ref 150–400)
RBC: 3.63 MIL/uL — ABNORMAL LOW (ref 3.87–5.11)
RDW: 13 % (ref 11.5–15.5)
WBC: 10.5 10*3/uL (ref 4.0–10.5)

## 2017-08-23 LAB — URINALYSIS, ROUTINE W REFLEX MICROSCOPIC
BILIRUBIN URINE: NEGATIVE
Glucose, UA: NEGATIVE mg/dL
Hgb urine dipstick: NEGATIVE
Ketones, ur: 5 mg/dL — AB
LEUKOCYTES UA: NEGATIVE
Nitrite: NEGATIVE
Protein, ur: NEGATIVE mg/dL
SPECIFIC GRAVITY, URINE: 1.004 — AB (ref 1.005–1.030)
pH: 7 (ref 5.0–8.0)

## 2017-08-23 LAB — HIV ANTIBODY (ROUTINE TESTING W REFLEX): HIV SCREEN 4TH GENERATION: NONREACTIVE

## 2017-08-23 LAB — LIPASE, BLOOD: Lipase: 29 U/L (ref 11–51)

## 2017-08-23 MED ORDER — POTASSIUM CHLORIDE CRYS ER 20 MEQ PO TBCR
40.0000 meq | EXTENDED_RELEASE_TABLET | Freq: Once | ORAL | Status: DC
  Filled 2017-08-23: qty 2

## 2017-08-23 MED ORDER — FAMOTIDINE 20 MG PO TABS: 20.0000 mg | ORAL_TABLET | Freq: Every day | ORAL | 0 refills | Status: DC

## 2017-08-23 MED ORDER — PROMETHAZINE HCL 25 MG RE SUPP: 25.0000 mg | Freq: Four times a day (QID) | RECTAL | 0 refills | Status: DC | PRN

## 2017-08-23 MED ORDER — FENTANYL CITRATE (PF) 100 MCG/2ML IJ SOLN
50.0000 ug | INTRAMUSCULAR | Status: DC | PRN
  Administered 2017-08-23: 50 ug via INTRAVENOUS
  Filled 2017-08-23: qty 2

## 2017-08-23 MED ORDER — PANTOPRAZOLE SODIUM 40 MG PO TBEC: 40.0000 mg | DELAYED_RELEASE_TABLET | Freq: Every day | ORAL | 1 refills | Status: DC

## 2017-08-23 MED ORDER — SODIUM CHLORIDE 0.9 % IV BOLUS
1000.0000 mL | Freq: Once | INTRAVENOUS | Status: AC
  Administered 2017-08-23: 1000 mL via INTRAVENOUS

## 2017-08-23 MED ORDER — FAMOTIDINE IN NACL 20-0.9 MG/50ML-% IV SOLN
20.0000 mg | Freq: Once | INTRAVENOUS | Status: AC
  Administered 2017-08-23: 20 mg via INTRAVENOUS
  Filled 2017-08-23: qty 50

## 2017-08-23 MED ORDER — ONDANSETRON HCL 4 MG/2ML IJ SOLN
4.0000 mg | INTRAMUSCULAR | Status: AC | PRN
  Administered 2017-08-23 (×2): 4 mg via INTRAVENOUS
  Filled 2017-08-23 (×2): qty 2

## 2017-08-23 MED ORDER — POTASSIUM CHLORIDE CRYS ER 20 MEQ PO TBCR
40.0000 meq | EXTENDED_RELEASE_TABLET | Freq: Once | ORAL | Status: AC
  Administered 2017-08-23: 40 meq via ORAL

## 2017-08-23 MED ORDER — ONDANSETRON 4 MG PO TBDP: 4.0000 mg | ORAL_TABLET | Freq: Three times a day (TID) | ORAL | 0 refills | Status: DC | PRN

## 2017-08-23 NOTE — Progress Notes (Signed)
Patient discharged home with IV removed and site intact. Patient discharged with personal belongings and prescriptions.  

## 2017-08-23 NOTE — ED Provider Notes (Signed)
[facility] Provider Note   CSN: [id number] Arrival date & time: 08/23/17  [id number]     History   Chief Complaint Chief Complaint  Patient presents with  . Abdominal Pain    HPI [name] Taylor is a 32 y.o. female.  HPI  Pt was seen at 1940.  Per pt and her family, c/o gradual onset and persistence of constant RUQ abd "pain" since being discharged from the hospital at 1500 today for dx intractable N/V.  Has been associated with multiple intermittent episodes of N/V.  Describes the abd pain as "cramping." States her pain began after eating chicken and potatoes. Pt states she was told her symptoms "might be my gallbladder."  Denies diarrhea, no fevers, no back pain, no rash, no CP/SOB, no black or blood in stools or emesis.      Past Medical History:  Diagnosis Date  . Burning with urination 10/02/2013  . Chronic headaches   . Chronic leg pain    left lower leg  . Hematuria 10/02/2013  . LLQ pain 10/02/2013  . Marijuana use   . Vaginal discharge 10/02/2013    Patient Active Problem List   Diagnosis Date Noted  . Intractable nausea and vomiting 08/22/2017  . Tobacco abuse 08/22/2017  . Right upper quadrant abdominal pain   . Burning with urination 10/02/2013  . Hematuria 10/02/2013  . LLQ pain 10/02/2013  . Vaginal discharge 10/02/2013    History reviewed. No pertinent surgical history.   OB History    Gravida  3   Para  3   Term      Preterm  1   AB      Living        SAB      TAB      Ectopic      Multiple      Live Births               Home Medications    Prior to Admission medications   Medication Sig Start Date End Date Taking? Authorizing Provider  acetaminophen (TYLENOL) 500 MG tablet Take 500 mg by mouth every 6 (six) hours as needed.   Yes [provider]  etonogestrel (IMPLANON) 68 MG IMPL implant Inject 1 each into the skin once.     Yes [provider]  pantoprazole (PROTONIX) 40 MG tablet Take 1  tablet (40 mg total) by mouth daily. 08/23/17 10/22/17 Yes Henderson CloudHernandez Acosta, Estela Y, MD    Family History Family History  Problem Relation Age of Onset  . Other Maternal Grandmother        abnormal heart beat    Social History Social History   Tobacco Use  . Smoking status: Current Every Day Smoker    Packs/day: 1.00    Types: Cigarettes  . Smokeless tobacco: Never Used  Substance Use Topics  . Alcohol use: No  . Drug use: No     Allergies   Patient has no known allergies.   Review of Systems Review of Systems ROS: Statement: All systems negative except as marked or noted in the HPI; Constitutional: Negative for fever and chills. ; ; Eyes: Negative for eye pain, redness and discharge. ; ; ENMT: Negative for ear pain, hoarseness, nasal congestion, sinus pressure and sore throat. ; ; Cardiovascular: Negative for chest pain, palpitations, diaphoresis, dyspnea and peripheral edema. ; ; Respiratory: Negative for cough, wheezing and stridor. ; ; Gastrointestinal: +N/V, abd pain. Negative for diarrhea, blood in  stool, hematemesis, jaundice and rectal bleeding. . ; ; Genitourinary: Negative for dysuria, flank pain and hematuria. ; ; Musculoskeletal: Negative for back pain and neck pain. Negative for swelling and trauma.; ; Skin: Negative for pruritus, rash, abrasions, blisters, bruising and skin lesion.; ; Neuro: Negative for headache, lightheadedness and neck stiffness. Negative for weakness, altered level of consciousness, altered mental status, extremity weakness, paresthesias, involuntary movement, seizure and syncope.       Physical Exam Updated Vital Signs BP (!) 106/58 (BP Location: Right Arm)   Pulse (!) 59   Temp 98.4 F (36.9 C) (Oral)   Resp 18   SpO2 100%    BP (!) 110/57   Pulse (!) 50   Temp 98.4 F (36.9 C) (Oral)   Resp 18   SpO2 98%    Physical Exam 1945: Physical examination:  Nursing notes reviewed; Vital signs and O2 SAT reviewed;  Constitutional:  Well developed, Well nourished, Well hydrated, Laying on her left side on my arrival to exam room.; Head:  Normocephalic, atraumatic; Eyes: EOMI, PERRL, No scleral icterus; ENMT: Mouth and pharynx normal, Mucous membranes moist; Neck: Supple, Full range of motion, No lymphadenopathy; Cardiovascular: Regular rate and rhythm, No gallop; Respiratory: Breath sounds clear & equal bilaterally, No wheezes.  Speaking full sentences with ease, Normal respiratory effort/excursion; Chest: Nontender, Movement normal; Abdomen: Soft, +RUQ and mid-epigastric tenderness to palp. No rebound or guarding. Nondistended, Normal bowel sounds; Genitourinary: No CVA tenderness; Extremities: Peripheral pulses normal, No tenderness, No edema, No calf edema or asymmetry.; Neuro: AA&Ox3, Major CN grossly intact.  Speech clear. No gross focal motor or sensory deficits in extremities.; Skin: Color normal, Warm, Dry.    ED Treatments / Results  Labs (all labs ordered are listed, but only abnormal results are displayed)   EKG None  Radiology   Procedures Procedures (including critical care time)  Medications Ordered in ED Medications  sodium chloride 0.9 % bolus 1,000 mL (1,000 mLs Intravenous New Bag/Given 08/23/17 2014)  famotidine (PEPCID) IVPB 20 mg premix (20 mg Intravenous New Bag/Given 08/23/17 2014)  ondansetron (ZOFRAN) injection 4 mg (4 mg Intravenous Given 08/23/17 2014)  fentaNYL (SUBLIMAZE) injection 50 mcg (50 mcg Intravenous Given 08/23/17 2014)     Initial Impression / Assessment and Plan / ED Course  I have reviewed the triage vital signs and the nursing notes.  Pertinent labs & imaging results that were available during my care of the patient were reviewed by me and considered in my medical decision making (see chart for details).  MDM Reviewed: previous chart, nursing note and vitals Reviewed previous: labs and CT scan Interpretation: labs and x-ray   Results for orders placed or performed  during the hospital encounter of 08/23/17  Comprehensive metabolic panel  Result Value Ref Range   Sodium 138 135 - 145 mmol/L   Potassium 3.4 (L) 3.5 - 5.1 mmol/L   Chloride 105 98 - 111 mmol/L   CO2 25 22 - 32 mmol/L   Glucose, Bld 104 (H) 70 - 99 mg/dL   BUN 6 6 - 20 mg/dL   Creatinine, Ser 1.61 0.44 - 1.00 mg/dL   Calcium 8.7 (L) 8.9 - 10.3 mg/dL   Total Protein 7.4 6.5 - 8.1 g/dL   Albumin 4.4 3.5 - 5.0 g/dL   AST 28 15 - 41 U/L   ALT 34 0 - 44 U/L   Alkaline Phosphatase 45 38 - 126 U/L   Total Bilirubin 0.6 0.3 - 1.2 mg/dL   GFR  calc non Af Amer >60 >60 mL/min   GFR calc Af Amer >60 >60 mL/min   Anion gap 8 5 - 15  Lipase, blood  Result Value Ref Range   Lipase 29 11 - 51 U/L  CBC with Differential  Result Value Ref Range   WBC 14.9 (H) 4.0 - 10.5 K/uL   RBC 4.26 3.87 - 5.11 MIL/uL   Hemoglobin 13.3 12.0 - 15.0 g/dL   HCT 41.6 60.6 - 30.1 %   MCV 92.5 78.0 - 100.0 fL   MCH 31.2 26.0 - 34.0 pg   MCHC 33.8 30.0 - 36.0 g/dL   RDW 60.1 09.3 - 23.5 %   Platelets 253 150 - 400 K/uL   Neutrophils Relative % 71 %   Neutro Abs 10.5 (H) 1.7 - 7.7 K/uL   Lymphocytes Relative 22 %   Lymphs Abs 3.3 0.7 - 4.0 K/uL   Monocytes Relative 6 %   Monocytes Absolute 0.9 0.1 - 1.0 K/uL   Eosinophils Relative 1 %   Eosinophils Absolute 0.1 0.0 - 0.7 K/uL   Basophils Relative 0 %   Basophils Absolute 0.1 0.0 - 0.1 K/uL  Urinalysis, Routine w reflex microscopic  Result Value Ref Range   Color, Urine COLORLESS (A) YELLOW   APPearance CLEAR CLEAR   Specific Gravity, Urine 1.004 (L) 1.005 - 1.030   pH 7.0 5.0 - 8.0   Glucose, UA NEGATIVE NEGATIVE mg/dL   Hgb urine dipstick NEGATIVE NEGATIVE   Bilirubin Urine NEGATIVE NEGATIVE   Ketones, ur 5 (A) NEGATIVE mg/dL   Protein, ur NEGATIVE NEGATIVE mg/dL   Nitrite NEGATIVE NEGATIVE   Leukocytes, UA NEGATIVE NEGATIVE  Urine rapid drug screen (hosp performed)  Result Value Ref Range   Opiates NONE DETECTED NONE DETECTED   Cocaine NONE  DETECTED NONE DETECTED   Benzodiazepines NONE DETECTED NONE DETECTED   Amphetamines NONE DETECTED NONE DETECTED   Tetrahydrocannabinol POSITIVE (A) NONE DETECTED   Barbiturates NONE DETECTED NONE DETECTED   Ct Abdomen Pelvis W Contrast Result Date: 08/22/2017 CLINICAL DATA:  Acute abdominal pain.  Nausea and vomiting. EXAM: CT ABDOMEN AND PELVIS WITH CONTRAST TECHNIQUE: Multidetector CT imaging of the abdomen and pelvis was performed using the standard protocol following bolus administration of intravenous contrast. CONTRAST:  ISOVUE-300 IOPAMIDOL (ISOVUE-300) INJECTION 61% COMPARISON:  CT 07/27/2005 FINDINGS: Lower chest: Lung bases are clear. Hepatobiliary: Slight decreased hepatic density suggesting steatosis. Geographic area of more increased density in the left lobe of the liver. Gallbladder physiologically distended, no calcified stone. No biliary dilatation. Pancreas: No ductal dilatation or inflammation. Spleen: Normal in size without focal abnormality. Adrenals/Urinary Tract: No adrenal nodule. No hydronephrosis or perinephric edema. Homogeneous renal enhancement. Urinary bladder is decompressed without wall thickening. Stomach/Bowel: Stomach is within normal limits. Appendix appears normal, for example image 62 series 2. No evidence of bowel wall thickening, distention, or inflammatory changes. Vascular/Lymphatic: Prominent adnexal vascularity and dilatation of the left ovarian vein of 9 mm. Normal caliber abdominal aorta. No abdominopelvic adenopathy. Reproductive: 3.1 cm right ovarian cyst. Left ovary is unremarkable. Prominent adnexal and periuterine vascularity, left greater than right. Other: Small amount of free fluid in the pelvis. No free air or intra-abdominal abscess. Musculoskeletal: There are no acute or suspicious osseous abnormalities. IMPRESSION: 1. Simple right ovarian cyst measuring 3.1 cm. This is likely incidental, and no dedicated imaging follow-up is needed. 2. Prominent  adnexal vascularity and dilatation of the left ovarian vein, which can be seen with pelvic congestion syndrome. 3. Hepatic  steatosis. Geographic area of relative increased density in the left lobe felt to be an area of focal fatty sparing. Electronically Signed   By: Rubye OaksMelanie  Ehinger M.D.   On: 08/22/2017 01:46   [place] Abdomen Limited Ruq Result Date: 08/22/2017 CLINICAL DATA:  Nausea. EXAM: ULTRASOUND ABDOMEN LIMITED RIGHT UPPER QUADRANT COMPARISON:  CT abdomen pelvis 08/22/2017 FINDINGS: Gallbladder: Mild wall thickening measuring 7 mm. No gallstones. Negative sonographic Murphy sign. Common bile duct: Diameter: 3 mm Liver: No focal lesion identified. Within normal limits in parenchymal echogenicity. Portal vein is patent on color Doppler imaging with normal direction of blood flow towards the liver. IMPRESSION: Mild nonspecific gallbladder wall thickening without additional signs to suggest cholecystitis. No cholelithiasis. Electronically Signed   By: Annia Beltrew  Davis M.D.   On: 08/22/2017 11:14   Dg Abd Acute W/chest Result Date: 08/23/2017 CLINICAL DATA:  Abdominal pain and vomiting EXAM: DG ABDOMEN ACUTE W/ 1V CHEST COMPARISON:  Chest radiograph December 14, 2011; CT abdomen and pelvis August 22, 2017 FINDINGS: PA chest: Lungs are clear. Heart size and pulmonary vascularity are normal. No adenopathy. Supine and upright abdomen: There is moderate stool in the colon. There is no bowel dilatation or air-fluid level to suggest bowel obstruction. No free air. There are small phleboliths in the pelvis. IMPRESSION: Moderate stool in colon. No bowel obstruction or free air. Lungs clear. Electronically Signed   By: Bretta BangWilliam  Woodruff III M.D.   On: 08/23/2017 21:34    2340:  Potassium repleted PO.  Pt has tol PO well while in the ED without N/V.  No stooling while in the ED.  Abd benign, VSS. Feels better and wants to go home now. Requesting another dose of anti-emetic before d/c. Tx symptomatically, f/u PMD, GI MD,  General Surgery.  Dx and testing d/w pt and family.  Questions answered.  Verb understanding, agreeable to d/c home with outpt f/u.        Final Clinical Impressions(s) / ED Diagnoses   Final diagnoses:  None    ED Discharge Orders    None       Samuel JesterMcManus, Daelyn Mozer, DO 08/26/17 1215

## 2017-08-23 NOTE — Discharge Instructions (Addendum)
Eat a bland diet, avoiding greasy, fatty, fried foods, as well as spicy and acidic foods or beverages.  Avoid eating within 2 to 3 hours before going to bed or laying down.  Also avoid teas, colas, coffee, chocolate, pepermint and spearment.  Take the prescriptions as directed.  Increase your fluid intake (ie:  Gatoraide) for the next few days.  Eat a bland diet and advance to your regular (but low fat) diet slowly as you can tolerate it.  Call your regular medical doctor tomorrow to schedule a follow up appointment this week.  Return to the Emergency Department sooner if worsening.

## 2017-08-23 NOTE — Discharge Summary (Addendum)
Physician Discharge Summary  [name] Taylor:[id number] DOB: 09-Mar-1985 DOA: 08/21/2017  PCP: Babs Sciara, MD  Admit date: 08/21/2017 Discharge date: 08/23/2017  Time spent: 45 minutes, independent of time spent for smoking cessation counseling  Recommendations for Outpatient Follow-up:  -Will be discharged home today. -Advised to follow up with PCP in 2 weeks.   Discharge Diagnoses:  Principal Problem:   Intractable nausea and vomiting Active Problems:   Tobacco abuse   Right upper quadrant abdominal pain   Discharge Condition: Stable and improved  Filed Weights   08/22/17 0734  Weight: 64.7 kg    History of present illness:  As per Dr. Sherryll Burger on 8/20: [name] Taylor is a 32 y.o. female with medical history significant for ongoing tobacco abuse who presented to the emergency department with sudden onset right upper quadrant abdominal pain that began at approximately 3 AM yesterday.  Her pain has been persistent and is worsened with anything that she tries to eat.  She states that she gets some relief after she has some vomiting.  She has had ongoing nausea with multiple episodes of emesis.  She denies any fever or diarrhea but has had some chills.  Her symptoms appear to be worsened with any movement or palpation as well.  She has never had pain such as this previously.  She denies hematemesis.   ED Course: Vital signs stable.  Laboratory data with leukocytosis of 20,500, potassium 3.3, glucose 116.  Urine analysis negative for any findings.  Urine pregnancy screen negative.  CT of the abdomen pelvis with contrast performed demonstrating gallbladder distention and incidental right ovarian cyst.  There appears to be hepatic steatosis as well.  LFTs are within the normal range.  She has been given multiple doses of fentanyl, Zofran, and 2 L of IV fluid with some moderate improvement in her symptoms.  Hospital Course:   Intractable nausea and vomiting -Has had no further since  admission. -Possibility of biliary colic exist, right upper quadrant ultrasound and CT scan without concerning gallbladder findings, LFTs within normal limits. -Patient complains of significant reflux and wonder whether GERD might be an etiology as well. -Has been started on Protonix and notes significant improvement. -Advance diet, if tolerates can discharge home, discussed via phone with surgery who does not believe that an HIDA scan is necessary at this time.  Tobacco abuse I have discussed tobacco cessation with the patient.  I have counseled the patient regarding the negative impacts of continued tobacco use including but not limited to lung cancer, COPD, and cardiovascular disease.  I have discussed alternatives to tobacco and modalities that may help facilitate tobacco cessation including but not limited to biofeedback, hypnosis, and medications.  Total time spent with tobacco counseling was 4 minutes   Procedures:  None   Consultations:  None  Discharge Instructions  Discharge Instructions    Diet - low sodium heart healthy   Complete by:  As directed    Increase activity slowly   Complete by:  As directed      Allergies as of 08/23/2017   No Known Allergies     Medication List    TAKE these medications   acetaminophen 500 MG tablet Commonly known as:  TYLENOL Take 500 mg by mouth every 6 (six) hours as needed.   IMPLANON 68 MG Impl implant Generic drug:  etonogestrel Inject 1 each into the skin once.   pantoprazole 40 MG tablet Commonly known as:  PROTONIX Take 1  tablet (40 mg total) by mouth daily.      No Known Allergies Follow-up Information    Babs SciaraLuking, Scott A, MD. Schedule an appointment as soon as possible for a visit in 2 week(s).   Specialty:  Family Medicine Contact information: [address] [place] [address] [phone]            The results of significant diagnostics from this hospitalization (including imaging,  microbiology, ancillary and laboratory) are listed below for reference.    Significant Diagnostic Studies: Ct Abdomen Pelvis W Contrast  Result Date: 08/22/2017 CLINICAL DATA:  Acute abdominal pain.  Nausea and vomiting. EXAM: CT ABDOMEN AND PELVIS WITH CONTRAST TECHNIQUE: Multidetector CT imaging of the abdomen and pelvis was performed using the standard protocol following bolus administration of intravenous contrast. CONTRAST:  100mL ISOVUE-300 IOPAMIDOL (ISOVUE-300) INJECTION 61% COMPARISON:  CT 07/27/2005 FINDINGS: Lower chest: Lung bases are clear. Hepatobiliary: Slight decreased hepatic density suggesting steatosis. Geographic area of more increased density in the left lobe of the liver. Gallbladder physiologically distended, no calcified stone. No biliary dilatation. Pancreas: No ductal dilatation or inflammation. Spleen: Normal in size without focal abnormality. Adrenals/Urinary Tract: No adrenal nodule. No hydronephrosis or perinephric edema. Homogeneous renal enhancement. Urinary bladder is decompressed without wall thickening. Stomach/Bowel: Stomach is within normal limits. Appendix appears normal, for example image 62 series 2. No evidence of bowel wall thickening, distention, or inflammatory changes. Vascular/Lymphatic: Prominent adnexal vascularity and dilatation of the left ovarian vein of 9 mm. Normal caliber abdominal aorta. No abdominopelvic adenopathy. Reproductive: 3.1 cm right ovarian cyst. Left ovary is unremarkable. Prominent adnexal and periuterine vascularity, left greater than right. Other: Small amount of free fluid in the pelvis. No free air or intra-abdominal abscess. Musculoskeletal: There are no acute or suspicious osseous abnormalities. IMPRESSION: 1. Simple right ovarian cyst measuring 3.1 cm. This is likely incidental, and no dedicated imaging follow-up is needed. 2. Prominent adnexal vascularity and dilatation of the left ovarian vein, which can be seen with pelvic congestion  syndrome. 3. Hepatic steatosis. Geographic area of relative increased density in the left lobe felt to be an area of focal fatty sparing. Electronically Signed   By: Rubye OaksMelanie  Ehinger M.D.   On: 08/22/2017 01:46   [place] Abdomen Limited Ruq  Result Date: 08/22/2017 CLINICAL DATA:  Nausea. EXAM: ULTRASOUND ABDOMEN LIMITED RIGHT UPPER QUADRANT COMPARISON:  CT abdomen pelvis 08/22/2017 FINDINGS: Gallbladder: Mild wall thickening measuring 7 mm. No gallstones. Negative sonographic Murphy sign. Common bile duct: Diameter: 3 mm Liver: No focal lesion identified. Within normal limits in parenchymal echogenicity. Portal vein is patent on color Doppler imaging with normal direction of blood flow towards the liver. IMPRESSION: Mild nonspecific gallbladder wall thickening without additional signs to suggest cholecystitis. No cholelithiasis. Electronically Signed   By: Annia Beltrew  Davis M.D.   On: 08/22/2017 11:14    Microbiology: No results found for this or any previous visit (from the past 240 hour(s)).   Labs: Basic Metabolic Panel: Recent Labs  Lab 08/21/17 2232 08/22/17 0506 08/23/17 0551  NA 138 140 137  K 3.3* 3.5 4.1  CL 109 111 107  CO2 23 23 24   GLUCOSE 116* 107* 96  BUN 11 9 5*  CREATININE 0.77 0.77 0.75  CALCIUM 8.1* 7.6* 8.2*  MG  --  1.8  --    Liver Function Tests: Recent Labs  Lab 08/21/17 2232 08/22/17 0506 08/23/17 0551  AST 19 15 13*  ALT 14 13 17   ALKPHOS 42 35* 32*  BILITOT 0.6 0.6 0.6  PROT 6.8 6.0* 5.5*  ALBUMIN 3.9 3.4* 3.1*   Recent Labs  Lab 08/21/17 2232  LIPASE 26   No results for input(s): AMMONIA in the last 168 hours. CBC: Recent Labs  Lab 08/21/17 2232 08/22/17 0506 08/23/17 0551  WBC 20.5* 15.2* 10.5  NEUTROABS 16.0*  --   --   HGB 11.9* 11.2* 11.2*  HCT 35.7* 33.7* 34.3*  MCV 92.7 94.1 94.5  PLT 246 198 188   Cardiac Enzymes: No results for input(s): CKTOTAL, CKMB, CKMBINDEX, TROPONINI in the last 168 hours. BNP: BNP (last 3 results) No  results for input(s): BNP in the last 8760 hours.  ProBNP (last 3 results) No results for input(s): PROBNP in the last 8760 hours.  CBG: No results for input(s): GLUCAP in the last 168 hours.     Signed:  Chaya Janstela Hernandez Acosta  [facility] Pager: [phone] 08/23/2017, 1:57 PM

## 2017-08-23 NOTE — ED Triage Notes (Addendum)
Pt reports was just discharged from inpatient at 3pm today for abd pain. Pt reports she was told it was her gallbladder. Pt went home and tried to eat potatoes and grilled chicken and began to have emesis. Pt did not have meds for nausea and vomiting.

## 2017-08-24 ENCOUNTER — Encounter: Payer: Self-pay | Admitting: Family Medicine

## 2017-08-24 ENCOUNTER — Ambulatory Visit: Payer: BLUE CROSS/BLUE SHIELD | Admitting: Family Medicine

## 2017-08-24 VITALS — BP 124/72 | Ht 61.0 in | Wt 134.1 lb

## 2017-08-24 DIAGNOSIS — R1011 Right upper quadrant pain: Secondary | ICD-10-CM | POA: Diagnosis not present

## 2017-08-24 DIAGNOSIS — R1013 Epigastric pain: Secondary | ICD-10-CM | POA: Diagnosis not present

## 2017-08-24 DIAGNOSIS — K2900 Acute gastritis without bleeding: Secondary | ICD-10-CM

## 2017-08-24 MED ORDER — SUCRALFATE 1 GM/10ML PO SUSP: 1.0000 g | Freq: Three times a day (TID) | ORAL | 0 refills | Status: DC

## 2017-08-24 MED ORDER — PROMETHAZINE HCL 25 MG/ML IJ SOLN
12.5000 mg | Freq: Once | INTRAMUSCULAR | Status: AC
  Administered 2017-08-24: 12.5 mg via INTRAVENOUS
  Filled 2017-08-24: qty 1

## 2017-08-24 NOTE — Progress Notes (Signed)
   Subjective:    Patient ID: [name] Taylor, female    DOB: 05-03-1985, 32 y.o.   MRN: [id number]  HPI  Patient is here today follow up on ed visit for epigastric pain. She states she was hospitalized on Sunday and was released yesterday. They prescribed Pantoprazole 40 mg one daily and zofran. States she returned to the ed yesterday after eating some mashed potatoes she vomited and they told her to follow up with [name] Taylor. They also gave her GI information and pt states she called Dr. Darrick Taylor office and they state she will bew called with an appt,also give Dr.Jenkins number to set up an appt to discuss removal of gallbladder.   Patient with ongoing epigastric pain discomfort nausea with eating radiates into the lower chest some into the right upper quadrant ER notes were reviewed scanned lab reviewed hospital notes reviewed Review of Systems  Constitutional: Negative for activity change, fatigue and fever.  HENT: Negative for congestion and rhinorrhea.   Respiratory: Negative for cough, chest tightness and shortness of breath.   Cardiovascular: Negative for chest pain and leg swelling.  Gastrointestinal: Positive for abdominal pain, nausea and vomiting.  Skin: Negative for color change.  Neurological: Negative for dizziness and headaches.  Psychiatric/Behavioral: Negative for agitation and behavioral problems.       Objective:   Physical Exam  Constitutional: She appears well-developed and well-nourished. No distress.  HENT:  Head: Normocephalic and atraumatic.  Eyes: Right eye exhibits no discharge. Left eye exhibits no discharge.  Neck: No tracheal deviation present.  Cardiovascular: Normal rate, regular rhythm and normal heart sounds.  No murmur heard. Pulmonary/Chest: Effort normal and breath sounds normal. No respiratory distress. She has no wheezes. She has no rales.  Abdominal: Soft. She exhibits no distension and no mass. There is tenderness. There is no rebound and no guarding.    Musculoskeletal: She exhibits no edema.  Lymphadenopathy:    She has no cervical adenopathy.  Neurological: She is alert. She exhibits normal muscle tone.  Skin: Skin is warm and dry. No erythema.  Psychiatric: Her behavior is normal.  Vitals reviewed.    Work excuse given from [id number] through 825 with return to work on 826 we will go ahead with filling out FMLA     Assessment & Plan:  Abdominal pain Probable gastritis Add Carafate Need to rule out gallbladder dysfunction HIDA test ordered May need GI or general surgery referral Await the results of the tests

## 2017-08-25 ENCOUNTER — Telehealth: Payer: Self-pay | Admitting: Family Medicine

## 2017-08-25 NOTE — Telephone Encounter (Signed)
Filled out as best I could

## 2017-08-25 NOTE — Telephone Encounter (Signed)
[facility] was seen [facility]. I filled in what I could. Please review form and fill in highlighted areas on form. Date and sign. In your yellow folder.

## 2017-08-29 ENCOUNTER — Telehealth: Payer: Self-pay | Admitting: Family Medicine

## 2017-08-29 NOTE — Telephone Encounter (Signed)
Form in provider office 

## 2017-08-29 NOTE — Telephone Encounter (Signed)
[employer] faxed over short term disability paper to be filled in. I filled in what I could,please review and fill highlighted areas,date and sign. Form is in your yellow folder.

## 2017-08-29 NOTE — Telephone Encounter (Signed)
Patient was written down through 825 with return to work on 826 I fill this form out I recommend making a copy of lab work x-rays office visit ER notes to send with the form that I filled him

## 2017-08-31 DIAGNOSIS — Z029 Encounter for administrative examinations, unspecified: Secondary | ICD-10-CM

## 2017-09-01 ENCOUNTER — Other Ambulatory Visit: Payer: Self-pay | Admitting: *Deleted

## 2017-09-01 ENCOUNTER — Ambulatory Visit (HOSPITAL_COMMUNITY)
Admission: RE | Admit: 2017-09-01 | Discharge: 2017-09-01 | Disposition: A | Discharge status: Home / Self Care | Payer: BLUE CROSS/BLUE SHIELD | Source: Ambulatory Visit | Attending: Family Medicine | Admitting: Family Medicine

## 2017-09-01 DIAGNOSIS — R1013 Epigastric pain: Secondary | ICD-10-CM | POA: Diagnosis not present

## 2017-09-01 DIAGNOSIS — K828 Other specified diseases of gallbladder: Secondary | ICD-10-CM

## 2017-09-01 DIAGNOSIS — K2900 Acute gastritis without bleeding: Secondary | ICD-10-CM | POA: Diagnosis not present

## 2017-09-01 DIAGNOSIS — R1011 Right upper quadrant pain: Secondary | ICD-10-CM | POA: Diagnosis not present

## 2017-09-01 DIAGNOSIS — R948 Abnormal results of function studies of other organs and systems: Secondary | ICD-10-CM | POA: Diagnosis not present

## 2017-09-01 MED ORDER — TECHNETIUM TC 99M MEBROFENIN IV KIT
5.0000 | PACK | Freq: Once | INTRAVENOUS | Status: AC | PRN
  Administered 2017-09-01: 5.2 via INTRAVENOUS

## 2017-09-07 ENCOUNTER — Encounter: Payer: Self-pay | Admitting: Family Medicine

## 2017-09-14 ENCOUNTER — Encounter: Payer: Self-pay | Admitting: General Surgery

## 2017-09-14 ENCOUNTER — Ambulatory Visit: Payer: BLUE CROSS/BLUE SHIELD | Admitting: General Surgery

## 2017-09-14 ENCOUNTER — Ambulatory Visit (INDEPENDENT_AMBULATORY_CARE_PROVIDER_SITE_OTHER): Payer: BLUE CROSS/BLUE SHIELD | Admitting: General Surgery

## 2017-09-14 VITALS — BP 110/73 | HR 55 | Temp 97.5°F | Resp 18 | Wt 138.0 lb

## 2017-09-14 DIAGNOSIS — K811 Chronic cholecystitis: Secondary | ICD-10-CM

## 2017-09-14 NOTE — Patient Instructions (Signed)

## 2017-09-14 NOTE — Progress Notes (Signed)
Antony BlackbirdMegan R Kielty; [id number]; April 04, 1985   HPI Patient is a 32 year old white female who was referred to my care by Dr. Lilyan PuntScott Luking for evaluation treatment of chronic cholecystitis.  Patient has been having intermittent episodes of right upper quadrant abdominal pain with epigastric pain and bloating over the past month.  The episodes seem to be increasing in intensity and frequency.  It is made worse with some fatty foods.  No fever, chills, jaundice have been noted.  She currently has 0 out of 10 abdominal pain.  Ultrasound of the gallbladder was negative.  HIDA scan reveals a low gallbladder ejection fraction.  She did have nausea throughout the day after eating a fatty meal with a HIDA scan. Past Medical History:  Diagnosis Date  . Burning with urination 10/02/2013  . Chronic headaches   . Chronic leg pain    left lower leg  . Hematuria 10/02/2013  . LLQ pain 10/02/2013  . Marijuana use   . Vaginal discharge 10/02/2013    History reviewed. No pertinent surgical history.  Family History  Problem Relation Age of Onset  . Other Maternal Grandmother        abnormal heart beat    Current Outpatient Medications on File Prior to Visit  Medication Sig Dispense Refill  . acetaminophen (TYLENOL) 500 MG tablet Take 500 mg by mouth every 6 (six) hours as needed.    . etonogestrel (IMPLANON) 68 MG IMPL implant Inject 1 each into the skin once.      . famotidine (PEPCID) 20 MG tablet Take 1 tablet (20 mg total) by mouth daily. 15 tablet 0  . ondansetron (ZOFRAN ODT) 4 MG disintegrating tablet Take 1 tablet (4 mg total) by mouth every 8 (eight) hours as needed for nausea or vomiting. 4 tablet 0  . pantoprazole (PROTONIX) 40 MG tablet Take 1 tablet (40 mg total) by mouth daily. 30 tablet 1  . promethazine (PHENERGAN) 25 MG suppository Place 1 suppository (25 mg total) rectally every 6 (six) hours as needed for nausea or vomiting. 12 each 0  . sucralfate (CARAFATE) 1 GM/10ML suspension Take 10 mLs (1 g  total) by mouth 4 (four) times daily -  with meals and at bedtime. 420 mL 0   No current facility-administered medications on file prior to visit.     No Known Allergies  Social History   Substance and Sexual Activity  Alcohol Use No    Social History   Tobacco Use  Smoking Status Current Every Day Smoker  . Packs/day: 1.00  . Types: Cigarettes  Smokeless Tobacco Never Used    Review of Systems  Constitutional: Negative for fever.  HENT: Negative.   Eyes: Negative.   Respiratory: Negative.   Cardiovascular: Negative.   Gastrointestinal: Positive for abdominal pain, heartburn, nausea and vomiting.  Genitourinary: Negative.   Musculoskeletal: Negative.   Skin: Negative.   Neurological: Positive for headaches.  Endo/Heme/Allergies: Negative.   Psychiatric/Behavioral: Negative.     Objective   Vitals:   09/14/17 1440  BP: 110/73  Pulse: (!) 55  Resp: 18  Temp: (!) 97.5 F (36.4 C)    Physical Exam  Constitutional: She is oriented to person, place, and time. She appears well-developed and well-nourished. No distress.  HENT:  Head: Normocephalic and atraumatic.  Eyes: No scleral icterus.  Abdominal: Soft. Normal appearance and bowel sounds are normal. She exhibits no distension and no mass. There is no tenderness. There is no rigidity, no guarding and negative Murphy's sign.  Neurological:  She is alert and oriented to person, place, and time.  Skin: Skin is dry.  Vitals reviewed. Dr. Fletcher Anon notes reviewed.  Radiology reports reviewed.  Assessment  Chronic cholecystitis Plan   Patient is scheduled for laparoscopic cholecystectomy on 09/27/2017.  The risks and benefits of the procedure including bleeding, infection, hepatobiliary injury, and the possibility of an open procedure were fully explained to the patient, who gave informed consent.

## 2017-09-14 NOTE — H&P (Signed)
[name] Taylor; [id number]; 09/08/1985   HPI Patient is a 32-year-old white female who was referred to my care by Dr. Scott Luking for evaluation treatment of chronic cholecystitis.  Patient has been having intermittent episodes of right upper quadrant abdominal pain with epigastric pain and bloating over the past month.  The episodes seem to be increasing in intensity and frequency.  It is made worse with some fatty foods.  No fever, chills, jaundice have been noted.  She currently has 0 out of 10 abdominal pain.  Ultrasound of the gallbladder was negative.  HIDA scan reveals a low gallbladder ejection fraction.  She did have nausea throughout the day after eating a fatty meal with a HIDA scan. Past Medical History:  Diagnosis Date  . Burning with urination 10/02/2013  . Chronic headaches   . Chronic leg pain    left lower leg  . Hematuria 10/02/2013  . LLQ pain 10/02/2013  . Marijuana use   . Vaginal discharge 10/02/2013    History reviewed. No pertinent surgical history.  Family History  Problem Relation Age of Onset  . Other Maternal Grandmother        abnormal heart beat    Current Outpatient Medications on File Prior to Visit  Medication Sig Dispense Refill  . acetaminophen (TYLENOL) 500 MG tablet Take 500 mg by mouth every 6 (six) hours as needed.    . etonogestrel (IMPLANON) 68 MG IMPL implant Inject 1 each into the skin once.      . famotidine (PEPCID) 20 MG tablet Take 1 tablet (20 mg total) by mouth daily. 15 tablet 0  . ondansetron (ZOFRAN ODT) 4 MG disintegrating tablet Take 1 tablet (4 mg total) by mouth every 8 (eight) hours as needed for nausea or vomiting. 4 tablet 0  . pantoprazole (PROTONIX) 40 MG tablet Take 1 tablet (40 mg total) by mouth daily. 30 tablet 1  . promethazine (PHENERGAN) 25 MG suppository Place 1 suppository (25 mg total) rectally every 6 (six) hours as needed for nausea or vomiting. 12 each 0  . sucralfate (CARAFATE) 1 GM/10ML suspension Take 10 mLs (1 g  total) by mouth 4 (four) times daily -  with meals and at bedtime. 420 mL 0   No current facility-administered medications on file prior to visit.     No Known Allergies  Social History   Substance and Sexual Activity  Alcohol Use No    Social History   Tobacco Use  Smoking Status Current Every Day Smoker  . Packs/day: 1.00  . Types: Cigarettes  Smokeless Tobacco Never Used    Review of Systems  Constitutional: Negative for fever.  HENT: Negative.   Eyes: Negative.   Respiratory: Negative.   Cardiovascular: Negative.   Gastrointestinal: Positive for abdominal pain, heartburn, nausea and vomiting.  Genitourinary: Negative.   Musculoskeletal: Negative.   Skin: Negative.   Neurological: Positive for headaches.  Endo/Heme/Allergies: Negative.   Psychiatric/Behavioral: Negative.     Objective   Vitals:   09/14/17 1440  BP: 110/73  Pulse: (!) 55  Resp: 18  Temp: (!) 97.5 F (36.4 C)    Physical Exam  Constitutional: She is oriented to person, place, and time. She appears well-developed and well-nourished. No distress.  HENT:  Head: Normocephalic and atraumatic.  Eyes: No scleral icterus.  Abdominal: Soft. Normal appearance and bowel sounds are normal. She exhibits no distension and no mass. There is no tenderness. There is no rigidity, no guarding and negative Murphy's sign.  Neurological:   She is alert and oriented to person, place, and time.  Skin: Skin is dry.  Vitals reviewed. Dr. Luking's notes reviewed.  Radiology reports reviewed.  Assessment  Chronic cholecystitis Plan   Patient is scheduled for laparoscopic cholecystectomy on 09/27/2017.  The risks and benefits of the procedure including bleeding, infection, hepatobiliary injury, and the possibility of an open procedure were fully explained to the patient, who gave informed consent. 

## 2017-09-20 NOTE — Patient Instructions (Signed)
[name] Taylor  09/20/2017     @PREFPERIOPPHARMACY @   Your procedure is scheduled on  09/27/2017   Report to [facility] at  700   A.M.  Call this number if you have problems the morning of surgery:  [phone]   Remember:  Do not eat or drink after midnight.  You may drink clear liquids until  12 midnight 09/26/2017 .  Clear liquids allowed are:                    Water, Juice (non-citric and without pulp), Carbonated beverages, Clear Tea, Black Coffee only, Plain Jell-O only, Gatorade and Plain Popsicles only    Take these medicines the morning of surgery with A SIP OF WATER  Pepcid, zofran or phenergan ( if needed), protonix.    Do not wear jewelry, make-up or nail polish.  Do not wear lotions, powders, or perfumes, or deodorant.  Do not shave 48 hours prior to surgery.  Men may shave face and neck.  Do not bring valuables to the hospital.  [facility] is not responsible for any belongings or valuables.  Contacts, dentures or bridgework may not be worn into surgery.  Leave your suitcase in the car.  After surgery it may be brought to your room.  For patients admitted to the hospital, discharge time will be determined by your treatment team.  Patients discharged the day of surgery will not be allowed to drive home.   Name and phone number of your driver:   family Special instructions:  None  Please read over the following fact sheets that you were given. Anesthesia Post-op Instructions and Care and Recovery After Surgery       Laparoscopic Cholecystectomy Laparoscopic cholecystectomy is surgery to remove the gallbladder. The gallbladder is a pear-shaped organ that lies beneath the liver on the right side of the body. The gallbladder stores bile, which is a fluid that helps the body to digest fats. Cholecystectomy is often done for inflammation of the gallbladder (cholecystitis). This condition is usually caused by a buildup of gallstones (cholelithiasis) in  the gallbladder. Gallstones can block the flow of bile, which can result in inflammation and pain. In severe cases, emergency surgery may be required. This procedure is done though small incisions in your abdomen (laparoscopic surgery). A thin scope with a camera (laparoscope) is inserted through one incision. Thin surgical instruments are inserted through the other incisions. In some cases, a laparoscopic procedure may be turned into a type of surgery that is done through a larger incision (open surgery). Tell a health care provider about:  Any allergies you have.  All medicines you are taking, including vitamins, herbs, eye drops, creams, and over-the-counter medicines.  Any problems you or family members have had with anesthetic medicines.  Any blood disorders you have.  Any surgeries you have had.  Any medical conditions you have.  Whether you are pregnant or may be pregnant. What are the risks? Generally, this is a safe procedure. However, problems may occur, including:  Infection.  Bleeding.  Allergic reactions to medicines.  Damage to other structures or organs.  A stone remaining in the common bile duct. The common bile duct carries bile from the gallbladder into the small intestine.  A bile leak from the cyst duct that is clipped when your gallbladder is removed.  What happens before the procedure? Staying hydrated Follow instructions from your health care provider  about hydration, which may include:  Up to 2 hours before the procedure - you may continue to drink clear liquids, such as water, clear fruit juice, black coffee, and plain tea.  Eating and drinking restrictions Follow instructions from your health care provider about eating and drinking, which may include:  8 hours before the procedure - stop eating heavy meals or foods such as meat, fried foods, or fatty foods.  6 hours before the procedure - stop eating light meals or foods, such as toast or  cereal.  6 hours before the procedure - stop drinking milk or drinks that contain milk.  2 hours before the procedure - stop drinking clear liquids.  Medicines  Ask your health care provider about: ? Changing or stopping your regular medicines. This is especially important if you are taking diabetes medicines or blood thinners. ? Taking medicines such as aspirin and ibuprofen. These medicines can thin your blood. Do not take these medicines before your procedure if your health care provider instructs you not to.  You may be given antibiotic medicine to help prevent infection. General instructions  Let your health care provider know if you develop a cold or an infection before surgery.  Plan to have someone take you home from the hospital or clinic.  Ask your health care provider how your surgical site will be marked or identified. What happens during the procedure?  To reduce your risk of infection: ? Your health care team will wash or sanitize their hands. ? Your skin will be washed with soap. ? Hair may be removed from the surgical area.  An IV tube may be inserted into one of your veins.  You will be given one or more of the following: ? A medicine to help you relax (sedative). ? A medicine to make you fall asleep (general anesthetic).  A breathing tube will be placed in your mouth.  Your surgeon will make several small cuts (incisions) in your abdomen.  The laparoscope will be inserted through one of the small incisions. The camera on the laparoscope will send images to a TV screen (monitor) in the operating room. This lets your surgeon see inside your abdomen.  Air-like gas will be pumped into your abdomen. This will expand your abdomen to give the surgeon more room to perform the surgery.  Other tools that are needed for the procedure will be inserted through the other incisions. The gallbladder will be removed through one of the incisions.  Your common bile duct may  be examined. If stones are found in the common bile duct, they may be removed.  After your gallbladder has been removed, the incisions will be closed with stitches (sutures), staples, or skin glue.  Your incisions may be covered with a bandage (dressing). The procedure may vary among health care providers and hospitals. What happens after the procedure?  Your blood pressure, heart rate, breathing rate, and blood oxygen level will be monitored until the medicines you were given have worn off.  You will be given medicines as needed to control your pain.  Do not drive for 24 hours if you were given a sedative. This information is not intended to replace advice given to you by your health care provider. Make sure you discuss any questions you have with your health care provider. Document Released: 12/20/2004 Document Revised: 07/12/2015 Document Reviewed: 06/08/2015 Elsevier Interactive Patient Education  2018 [employer].  Laparoscopic Cholecystectomy, Care After This sheet gives you information about how to care  for yourself after your procedure. Your health care provider may also give you more specific instructions. If you have problems or questions, contact your health care provider. What can I expect after the procedure? After the procedure, it is common to have:  Pain at your incision sites. You will be given medicines to control this pain.  Mild nausea or vomiting.  Bloating and possible shoulder pain from the air-like gas that was used during the procedure.  Follow these instructions at home: Incision care   Follow instructions from your health care provider about how to take care of your incisions. Make sure you: ? Wash your hands with soap and water before you change your bandage (dressing). If soap and water are not available, use hand sanitizer. ? Change your dressing as told by your health care provider. ? Leave stitches (sutures), skin glue, or adhesive strips in place.  These skin closures may need to be in place for 2 weeks or longer. If adhesive strip edges start to loosen and curl up, you may trim the loose edges. Do not remove adhesive strips completely unless your health care provider tells you to do that.  Do not take baths, swim, or use a hot tub until your health care provider approves. Ask your health care provider if you can take showers. You may only be allowed to take sponge baths for bathing.  Check your incision area every day for signs of infection. Check for: ? More redness, swelling, or pain. ? More fluid or blood. ? Warmth. ? Pus or a bad smell. Activity  Do not drive or use heavy machinery while taking prescription pain medicine.  Do not lift anything that is heavier than 10 lb (4.5 kg) until your health care provider approves.  Do not play contact sports until your health care provider approves.  Do not drive for 24 hours if you were given a medicine to help you relax (sedative).  Rest as needed. Do not return to work or school until your health care provider approves. General instructions  Take over-the-counter and prescription medicines only as told by your health care provider.  To prevent or treat constipation while you are taking prescription pain medicine, your health care provider may recommend that you: ? Drink enough fluid to keep your urine clear or pale yellow. ? Take over-the-counter or prescription medicines. ? Eat foods that are high in fiber, such as fresh fruits and vegetables, whole grains, and beans. ? Limit foods that are high in fat and processed sugars, such as fried and sweet foods. Contact a health care provider if:  You develop a rash.  You have more redness, swelling, or pain around your incisions.  You have more fluid or blood coming from your incisions.  Your incisions feel warm to the touch.  You have pus or a bad smell coming from your incisions.  You have a fever.  One or more of your  incisions breaks open. Get help right away if:  You have trouble breathing.  You have chest pain.  You have increasing pain in your shoulders.  You faint or feel dizzy when you stand.  You have severe pain in your abdomen.  You have nausea or vomiting that lasts for more than one day.  You have leg pain. This information is not intended to replace advice given to you by your health care provider. Make sure you discuss any questions you have with your health care provider. Document Released: 12/20/2004 Document Revised: 07/11/2015  Document Reviewed: 06/08/2015 Elsevier Interactive Patient Education  [employer].  General Anesthesia, Adult General anesthesia is the use of medicines to make a person "go to sleep" (be unconscious) for a medical procedure. General anesthesia is often recommended when a procedure:  Is long.  Requires you to be still or in an unusual position.  Is major and can cause you to lose blood.  Is impossible to do without general anesthesia.  The medicines used for general anesthesia are called general anesthetics. In addition to making you sleep, the medicines:  Prevent pain.  Control your blood pressure.  Relax your muscles.  Tell a health care provider about:  Any allergies you have.  All medicines you are taking, including vitamins, herbs, eye drops, creams, and over-the-counter medicines.  Any problems you or family members have had with anesthetic medicines.  Types of anesthetics you have had in the past.  Any bleeding disorders you have.  Any surgeries you have had.  Any medical conditions you have.  Any history of heart or lung conditions, such as heart failure, sleep apnea, or chronic obstructive pulmonary disease (COPD).  Whether you are pregnant or may be pregnant.  Whether you use tobacco, alcohol, marijuana, or street drugs.  Any history of Financial [employer].  Any history of depression or anxiety. What are the  risks? Generally, this is a safe procedure. However, problems may occur, including:  Allergic reaction to anesthetics.  Lung and heart problems.  Inhaling food or liquids from your stomach into your lungs (aspiration).  Injury to nerves.  Waking up during your procedure and being unable to move (rare).  Extreme agitation or a state of mental confusion (delirium) when you wake up from the anesthetic.  Air in the bloodstream, which can lead to stroke.  These problems are more likely to develop if you are having a major surgery or if you have an advanced medical condition. You can prevent some of these complications by answering all of your health care provider's questions thoroughly and by following all pre-procedure instructions. General anesthesia can cause side effects, including:  Nausea or vomiting  A sore throat from the breathing tube.  Feeling cold or shivery.  Feeling tired, washed out, or achy.  Sleepiness or drowsiness.  Confusion or agitation.  What happens before the procedure? Staying hydrated Follow instructions from your health care provider about hydration, which may include:  Up to 2 hours before the procedure - you may continue to drink clear liquids, such as water, clear fruit juice, black coffee, and plain tea.  Eating and drinking restrictions Follow instructions from your health care provider about eating and drinking, which may include:  8 hours before the procedure - stop eating heavy meals or foods such as meat, fried foods, or fatty foods.  6 hours before the procedure - stop eating light meals or foods, such as toast or cereal.  6 hours before the procedure - stop drinking milk or drinks that contain milk.  2 hours before the procedure - stop drinking clear liquids.  Medicines  Ask your health care provider about: ? Changing or stopping your regular medicines. This is especially important if you are taking diabetes medicines or blood  thinners. ? Taking medicines such as aspirin and ibuprofen. These medicines can thin your blood. Do not take these medicines before your procedure if your health care provider instructs you not to. ? Taking new dietary supplements or medicines. Do not take these during the week before your procedure  unless your health care provider approves them.  If you are told to take a medicine or to continue taking a medicine on the day of the procedure, take the medicine with sips of water. General instructions   Ask if you will be going home the same day, the following day, or after a longer hospital stay. ? Plan to have someone take you home. ? Plan to have someone stay with you for the first 24 hours after you leave the hospital or clinic.  For 3-6 weeks before the procedure, try not to use any tobacco products, such as cigarettes, chewing tobacco, and e-cigarettes.  You may brush your teeth on the morning of the procedure, but make sure to spit out the toothpaste. What happens during the procedure?  You will be given anesthetics through a mask and through an IV tube in one of your veins.  You may receive medicine to help you relax (sedative).  As soon as you are asleep, a breathing tube may be used to help you breathe.  An anesthesia specialist will stay with you throughout the procedure. He or she will help keep you comfortable and safe by continuing to give you medicines and adjusting the amount of medicine that you get. He or she will also watch your blood pressure, pulse, and oxygen levels to make sure that the anesthetics do not cause any problems.  If a breathing tube was used to help you breathe, it will be removed before you wake up. The procedure may vary among health care providers and hospitals. What happens after the procedure?  You will wake up, often slowly, after the procedure is complete, usually in a recovery area.  Your blood pressure, heart rate, breathing rate, and blood  oxygen level will be monitored until the medicines you were given have worn off.  You may be given medicine to help you calm down if you feel anxious or agitated.  If you will be going home the same day, your health care provider may check to make sure you can stand, drink, and urinate.  Your health care providers will treat your pain and side effects before you go home.  Do not drive for 24 hours if you received a sedative.  You may: ? Feel nauseous and vomit. ? Have a sore throat. ? Have mental slowness. ? Feel cold or shivery. ? Feel sleepy. ? Feel tired. ? Feel sore or achy, even in parts of your body where you did not have surgery. This information is not intended to replace advice given to you by your health care provider. Make sure you discuss any questions you have with your health care provider. Document Released: 03/29/2007 Document Revised: 06/02/2015 Document Reviewed: 12/04/2014 Elsevier Interactive Patient Education  2018 [employer]. General Anesthesia, Adult, Care After These instructions provide you with information about caring for yourself after your procedure. Your health care provider may also give you more specific instructions. Your treatment has been planned according to current medical practices, but problems sometimes occur. Call your health care provider if you have any problems or questions after your procedure. What can I expect after the procedure? After the procedure, it is common to have:  Vomiting.  A sore throat.  Mental slowness.  It is common to feel:  Nauseous.  Cold or shivery.  Sleepy.  Tired.  Sore or achy, even in parts of your body where you did not have surgery.  Follow these instructions at home: For at least 24 hours  after the procedure:  Do not: ? Participate in activities where you could fall or become injured. ? Drive. ? Use heavy machinery. ? Drink alcohol. ? Take sleeping pills or medicines that cause  drowsiness. ? Make important decisions or sign legal documents. ? Take care of children on your own.  Rest. Eating and drinking  If you vomit, drink water, juice, or soup when you can drink without vomiting.  Drink enough fluid to keep your urine clear or pale yellow.  Make sure you have little or no nausea before eating solid foods.  Follow the diet recommended by your health care provider. General instructions  Have a responsible adult stay with you until you are awake and alert.  Return to your normal activities as told by your health care provider. Ask your health care provider what activities are safe for you.  Take over-the-counter and prescription medicines only as told by your health care provider.  If you smoke, do not smoke without supervision.  Keep all follow-up visits as told by your health care provider. This is important. Contact a health care provider if:  You continue to have nausea or vomiting at home, and medicines are not helpful.  You cannot drink fluids or start eating again.  You cannot urinate after 8-12 hours.  You develop a skin rash.  You have fever.  You have increasing redness at the site of your procedure. Get help right away if:  You have difficulty breathing.  You have chest pain.  You have unexpected bleeding.  You feel that you are having a life-threatening or urgent problem. This information is not intended to replace advice given to you by your health care provider. Make sure you discuss any questions you have with your health care provider. Document Released: 03/28/2000 Document Revised: 05/25/2015 Document Reviewed: 12/04/2014 Elsevier Interactive Patient Education  [employer].

## 2017-09-22 ENCOUNTER — Other Ambulatory Visit: Payer: Self-pay

## 2017-09-22 ENCOUNTER — Encounter (HOSPITAL_COMMUNITY)
Admission: RE | Admit: 2017-09-22 | Discharge: 2017-09-22 | Disposition: A | Discharge status: Home / Self Care | Payer: BLUE CROSS/BLUE SHIELD | Source: Ambulatory Visit | Attending: General Surgery | Admitting: General Surgery

## 2017-09-22 ENCOUNTER — Encounter (HOSPITAL_COMMUNITY): Payer: Self-pay

## 2017-09-22 DIAGNOSIS — Z01812 Encounter for preprocedural laboratory examination: Secondary | ICD-10-CM | POA: Insufficient documentation

## 2017-09-22 HISTORY — DX: Gastro-esophageal reflux disease without esophagitis: K21.9

## 2017-09-22 LAB — PREGNANCY, URINE: PREG TEST UR: NEGATIVE

## 2017-09-27 ENCOUNTER — Encounter (HOSPITAL_COMMUNITY)
Admission: RE | Disposition: A | Discharge status: Home / Self Care | Payer: Self-pay | Source: Ambulatory Visit | Attending: General Surgery

## 2017-09-27 ENCOUNTER — Ambulatory Visit (HOSPITAL_COMMUNITY): Payer: BLUE CROSS/BLUE SHIELD | Admitting: Anesthesiology

## 2017-09-27 ENCOUNTER — Ambulatory Visit (HOSPITAL_COMMUNITY)
Admission: RE | Admit: 2017-09-27 | Discharge: 2017-09-27 | Disposition: A | Discharge status: Home / Self Care | Payer: BLUE CROSS/BLUE SHIELD | Source: Ambulatory Visit | Attending: General Surgery | Admitting: General Surgery

## 2017-09-27 ENCOUNTER — Encounter (HOSPITAL_COMMUNITY): Payer: Self-pay | Admitting: *Deleted

## 2017-09-27 DIAGNOSIS — F17210 Nicotine dependence, cigarettes, uncomplicated: Secondary | ICD-10-CM | POA: Insufficient documentation

## 2017-09-27 DIAGNOSIS — K811 Chronic cholecystitis: Secondary | ICD-10-CM | POA: Insufficient documentation

## 2017-09-27 HISTORY — PX: CHOLECYSTECTOMY: SHX55

## 2017-09-27 SURGERY — LAPAROSCOPIC CHOLECYSTECTOMY
Anesthesia: General | Site: Abdomen

## 2017-09-27 MED ORDER — SUGAMMADEX SODIUM 200 MG/2ML IV SOLN
INTRAVENOUS | Status: AC
  Filled 2017-09-27: qty 4

## 2017-09-27 MED ORDER — HYDROMORPHONE HCL 1 MG/ML IJ SOLN
0.2500 mg | INTRAMUSCULAR | Status: DC | PRN
  Administered 2017-09-27 (×4): 0.5 mg via INTRAVENOUS
  Filled 2017-09-27 (×4): qty 0.5

## 2017-09-27 MED ORDER — PROPOFOL 10 MG/ML IV BOLUS
INTRAVENOUS | Status: AC
  Filled 2017-09-27: qty 20

## 2017-09-27 MED ORDER — MEPERIDINE HCL 50 MG/ML IJ SOLN: 6.2500 mg | INTRAMUSCULAR | Status: DC | PRN

## 2017-09-27 MED ORDER — LIDOCAINE HCL (CARDIAC) PF 50 MG/5ML IV SOSY
PREFILLED_SYRINGE | INTRAVENOUS | Status: DC | PRN
  Administered 2017-09-27: 30 mg via INTRAVENOUS

## 2017-09-27 MED ORDER — PROPOFOL 10 MG/ML IV BOLUS
INTRAVENOUS | Status: DC | PRN
  Administered 2017-09-27: 100 mg via INTRAVENOUS

## 2017-09-27 MED ORDER — FENTANYL CITRATE (PF) 250 MCG/5ML IJ SOLN
INTRAMUSCULAR | Status: AC
  Filled 2017-09-27: qty 5

## 2017-09-27 MED ORDER — MIDAZOLAM HCL 5 MG/5ML IJ SOLN
INTRAMUSCULAR | Status: DC | PRN
  Administered 2017-09-27: 2 mg via INTRAVENOUS

## 2017-09-27 MED ORDER — POVIDONE-IODINE 10 % OINT PACKET
TOPICAL_OINTMENT | CUTANEOUS | Status: DC | PRN
  Administered 2017-09-27: 1 via TOPICAL

## 2017-09-27 MED ORDER — HYDROCODONE-ACETAMINOPHEN 7.5-325 MG PO TABS: 1.0000 | ORAL_TABLET | Freq: Once | ORAL | Status: DC | PRN

## 2017-09-27 MED ORDER — BUPIVACAINE LIPOSOME 1.3 % IJ SUSP
INTRAMUSCULAR | Status: DC | PRN
  Administered 2017-09-27: 10 mL

## 2017-09-27 MED ORDER — CIPROFLOXACIN IN D5W 400 MG/200ML IV SOLN
400.0000 mg | INTRAVENOUS | Status: AC
  Administered 2017-09-27: 400 mg via INTRAVENOUS
  Filled 2017-09-27: qty 200

## 2017-09-27 MED ORDER — CHLORHEXIDINE GLUCONATE CLOTH 2 % EX PADS: 6.0000 | MEDICATED_PAD | Freq: Once | CUTANEOUS | Status: DC

## 2017-09-27 MED ORDER — NEOSTIGMINE METHYLSULFATE 10 MG/10ML IV SOLN
INTRAVENOUS | Status: AC
  Filled 2017-09-27: qty 1

## 2017-09-27 MED ORDER — SUCCINYLCHOLINE CHLORIDE 20 MG/ML IJ SOLN
INTRAMUSCULAR | Status: AC
  Filled 2017-09-27: qty 1

## 2017-09-27 MED ORDER — ROCURONIUM 10MG/ML (10ML) SYRINGE FOR MEDFUSION PUMP - OPTIME
INTRAVENOUS | Status: DC | PRN
  Administered 2017-09-27 (×2): 5 mg via INTRAVENOUS
  Administered 2017-09-27: 20 mg via INTRAVENOUS

## 2017-09-27 MED ORDER — SUCCINYLCHOLINE 20MG/ML (10ML) SYRINGE FOR MEDFUSION PUMP - OPTIME
INTRAMUSCULAR | Status: DC | PRN
  Administered 2017-09-27: 100 mg via INTRAVENOUS

## 2017-09-27 MED ORDER — ONDANSETRON HCL 4 MG/2ML IJ SOLN
INTRAMUSCULAR | Status: DC | PRN
  Administered 2017-09-27: 4 mg via INTRAVENOUS

## 2017-09-27 MED ORDER — GLYCOPYRROLATE 0.2 MG/ML IJ SOLN
INTRAMUSCULAR | Status: AC
  Filled 2017-09-27: qty 3

## 2017-09-27 MED ORDER — SUGAMMADEX SODIUM 200 MG/2ML IV SOLN
INTRAVENOUS | Status: DC | PRN
  Administered 2017-09-27: 245 mg via INTRAVENOUS

## 2017-09-27 MED ORDER — ROCURONIUM BROMIDE 50 MG/5ML IV SOLN
INTRAVENOUS | Status: AC
  Filled 2017-09-27: qty 1

## 2017-09-27 MED ORDER — FENTANYL CITRATE (PF) 100 MCG/2ML IJ SOLN
INTRAMUSCULAR | Status: DC | PRN
  Administered 2017-09-27 (×3): 50 ug via INTRAVENOUS
  Administered 2017-09-27: 25 ug via INTRAVENOUS
  Administered 2017-09-27: 50 ug via INTRAVENOUS
  Administered 2017-09-27: 25 ug via INTRAVENOUS

## 2017-09-27 MED ORDER — MIDAZOLAM HCL 2 MG/2ML IJ SOLN
INTRAMUSCULAR | Status: AC
  Filled 2017-09-27: qty 2

## 2017-09-27 MED ORDER — LACTATED RINGERS IV SOLN: INTRAVENOUS | Status: DC

## 2017-09-27 MED ORDER — 0.9 % SODIUM CHLORIDE (POUR BTL) OPTIME
TOPICAL | Status: DC | PRN
  Administered 2017-09-27: 1000 mL

## 2017-09-27 MED ORDER — PROMETHAZINE HCL 25 MG/ML IJ SOLN: 6.2500 mg | INTRAMUSCULAR | Status: DC | PRN

## 2017-09-27 MED ORDER — KETOROLAC TROMETHAMINE 30 MG/ML IJ SOLN
30.0000 mg | Freq: Once | INTRAMUSCULAR | Status: AC
  Administered 2017-09-27: 30 mg via INTRAVENOUS

## 2017-09-27 MED ORDER — POVIDONE-IODINE 10 % EX OINT
TOPICAL_OINTMENT | CUTANEOUS | Status: AC
  Filled 2017-09-27: qty 1

## 2017-09-27 MED ORDER — HYDROCODONE-ACETAMINOPHEN 5-325 MG PO TABS: 1.0000 | ORAL_TABLET | ORAL | 0 refills | Status: DC | PRN

## 2017-09-27 MED ORDER — KETOROLAC TROMETHAMINE 30 MG/ML IJ SOLN
INTRAMUSCULAR | Status: AC
  Filled 2017-09-27: qty 1

## 2017-09-27 MED ORDER — LIDOCAINE HCL (PF) 1 % IJ SOLN
INTRAMUSCULAR | Status: AC
  Filled 2017-09-27: qty 5

## 2017-09-27 MED ORDER — LACTATED RINGERS IV SOLN
INTRAVENOUS | Status: DC
  Administered 2017-09-27: 1000 mL via INTRAVENOUS
  Administered 2017-09-27: 08:00:00 via INTRAVENOUS

## 2017-09-27 MED ORDER — HEMOSTATIC AGENTS (NO CHARGE) OPTIME
TOPICAL | Status: DC | PRN
  Administered 2017-09-27: 1 via TOPICAL

## 2017-09-27 MED ORDER — BUPIVACAINE LIPOSOME 1.3 % IJ SUSP
INTRAMUSCULAR | Status: AC
  Filled 2017-09-27: qty 20

## 2017-09-27 SURGICAL SUPPLY — 46 items
APPLIER CLIP ROT 10 11.4 M/L (STAPLE) ×2
BAG RETRIEVAL 10 (BASKET) ×1
CHLORAPREP W/TINT 26ML (MISCELLANEOUS) ×2 IMPLANT
CLIP APPLIE ROT 10 11.4 M/L (STAPLE) ×1 IMPLANT
CLOTH BEACON ORANGE TIMEOUT ST (SAFETY) ×2 IMPLANT
COVER LIGHT HANDLE STERIS (MISCELLANEOUS) ×4 IMPLANT
ELECT REM PT RETURN 9FT ADLT (ELECTROSURGICAL) ×2
ELECTRODE REM PT RTRN 9FT ADLT (ELECTROSURGICAL) ×1 IMPLANT
FILTER SMOKE EVAC LAPAROSHD (FILTER) ×2 IMPLANT
GLOVE BIO SURGEON STRL SZ7 (GLOVE) ×2 IMPLANT
GLOVE BIOGEL PI IND STRL 6.5 (GLOVE) ×1 IMPLANT
GLOVE BIOGEL PI IND STRL 7.0 (GLOVE) ×2 IMPLANT
GLOVE BIOGEL PI INDICATOR 6.5 (GLOVE) ×1
GLOVE BIOGEL PI INDICATOR 7.0 (GLOVE) ×2
GLOVE SURG SS PI 6.5 STRL IVOR (GLOVE) ×2 IMPLANT
GLOVE SURG SS PI 7.5 STRL IVOR (GLOVE) ×2 IMPLANT
GOWN STRL REUS W/ TWL XL LVL3 (GOWN DISPOSABLE) ×1 IMPLANT
GOWN STRL REUS W/TWL LRG LVL3 (GOWN DISPOSABLE) ×4 IMPLANT
GOWN STRL REUS W/TWL XL LVL3 (GOWN DISPOSABLE) ×1
HEMOSTAT SNOW SURGICEL 2X4 (HEMOSTASIS) ×2 IMPLANT
INST SET LAPROSCOPIC AP (KITS) ×2 IMPLANT
IV NS IRRIG 3000ML ARTHROMATIC (IV SOLUTION) IMPLANT
KIT TURNOVER KIT A (KITS) ×2 IMPLANT
MANIFOLD NEPTUNE II (INSTRUMENTS) ×2 IMPLANT
NEEDLE HYPO 18GX1.5 BLUNT FILL (NEEDLE) ×2 IMPLANT
NEEDLE HYPO 22GX1.5 SAFETY (NEEDLE) ×2 IMPLANT
NEEDLE INSUFFLATION 14GA 120MM (NEEDLE) ×2 IMPLANT
NS IRRIG 1000ML POUR BTL (IV SOLUTION) ×2 IMPLANT
PACK LAP CHOLE LZT030E (CUSTOM PROCEDURE TRAY) ×2 IMPLANT
PAD ARMBOARD 7.5X6 YLW CONV (MISCELLANEOUS) ×2 IMPLANT
SET BASIN LINEN APH (SET/KITS/TRAYS/PACK) ×2 IMPLANT
SET TUBE IRRIG SUCTION NO TIP (IRRIGATION / IRRIGATOR) IMPLANT
SLEEVE ENDOPATH XCEL 5M (ENDOMECHANICALS) ×2 IMPLANT
SPONGE GAUZE 2X2 8PLY STRL LF (GAUZE/BANDAGES/DRESSINGS) ×2 IMPLANT
STAPLER VISISTAT (STAPLE) ×2 IMPLANT
SUT VICRYL 0 UR6 27IN ABS (SUTURE) ×2 IMPLANT
SYR 20CC LL (SYRINGE) ×2 IMPLANT
SYS BAG RETRIEVAL 10MM (BASKET) ×1
SYSTEM BAG RETRIEVAL 10MM (BASKET) ×1 IMPLANT
TAPE PAPER 2X10 WHT MICROPORE (GAUZE/BANDAGES/DRESSINGS) ×2 IMPLANT
TROCAR ENDO BLADELESS 11MM (ENDOMECHANICALS) ×2 IMPLANT
TROCAR XCEL NON-BLD 5MMX100MML (ENDOMECHANICALS) ×2 IMPLANT
TROCAR XCEL UNIV SLVE 11M 100M (ENDOMECHANICALS) ×2 IMPLANT
TUBE CONNECTING 12X1/4 (SUCTIONS) ×2 IMPLANT
TUBING INSUFFLATION (TUBING) ×2 IMPLANT
WARMER LAPAROSCOPE (MISCELLANEOUS) ×2 IMPLANT

## 2017-09-27 NOTE — Anesthesia Postprocedure Evaluation (Signed)
Anesthesia Post Note  Patient: [name] Taylor  Procedure(s) Performed: LAPAROSCOPIC CHOLECYSTECTOMY (N/A Abdomen)  Patient location during evaluation: PACU Anesthesia Type: General Level of consciousness: awake and alert and oriented Pain management: pain level controlled (fairly controlled) Vital Signs Assessment: post-procedure vital signs reviewed and stable Respiratory status: spontaneous breathing Cardiovascular status: blood pressure returned to baseline and stable Postop Assessment: no apparent nausea or vomiting Anesthetic complications: no     Last Vitals:  Vitals:   09/27/17 1015 09/27/17 1026  BP: (!) 100/56 (!) 109/57  Pulse: (!) 54 62  Resp: (!) 8 18  Temp:  36.7 C  SpO2: 97% 96%    Last Pain:  Vitals:   09/27/17 1026  TempSrc:   PainSc: 8                  [name]

## 2017-09-27 NOTE — Transfer of Care (Signed)
Immediate Anesthesia Transfer of Care Note  Patient: [name] Taylor  Procedure(s) Performed: LAPAROSCOPIC CHOLECYSTECTOMY (N/A Abdomen)  Patient Location: PACU  Anesthesia Type:General  Level of Consciousness: awake  Airway & Oxygen Therapy: Patient Spontanous Breathing  Post-op Assessment: Report given to RN  Post vital signs: Reviewed and stable  Last Vitals:  Vitals Value Taken Time  BP 146/120 09/27/2017  9:16 AM  Temp    Pulse 71 09/27/2017  9:18 AM  Resp 15 09/27/2017  9:18 AM  SpO2 93 % 09/27/2017  9:18 AM  Vitals shown include unvalidated device data.  Last Pain:  Vitals:   09/27/17 0724  TempSrc: Oral      Patients Stated Pain Goal: 8 (09/27/17 0724)  Complications: No apparent anesthesia complications

## 2017-09-27 NOTE — Progress Notes (Signed)
Resting quietly. resp adequate/nonlabored.  

## 2017-09-27 NOTE — Progress Notes (Signed)
From OR. Awake. Crying. Moaning/groaning. Restless. C/O postop abd pain. Rates pain 9. Oriented to place per nurse. Reassurance given. Pain med per anesthesia.

## 2017-09-27 NOTE — Progress Notes (Signed)
Awake. Coke given to drink. Tolerated well. Appears more comfortable. Continues to rate pain 9.

## 2017-09-27 NOTE — Anesthesia Procedure Notes (Signed)
Procedure Name: Intubation Date/Time: 09/27/2017 8:32 AM Performed by: Ollen Bowl, CRNA Pre-anesthesia Checklist: Patient identified, Patient being monitored, Timeout performed, Emergency Drugs available and Suction available Patient Re-evaluated:Patient Re-evaluated prior to induction Oxygen Delivery Method: Circle system utilized Preoxygenation: Pre-oxygenation with 100% oxygen Induction Type: IV induction Ventilation: Mask ventilation without difficulty Laryngoscope Size: Mac and 3 Grade View: Grade I Tube type: Oral Tube size: 7.0 mm Number of attempts: 1 Airway Equipment and Method: Stylet Placement Confirmation: ETT inserted through vocal cords under direct vision,  positive ETCO2 and breath sounds checked- equal and bilateral Secured at: 21 cm Tube secured with: Tape Dental Injury: Teeth and Oropharynx as per pre-operative assessment

## 2017-09-27 NOTE — Progress Notes (Signed)
Awake. Crying. Continues c/o postop abd pain. Rates pain 9. BP 97/54. P 70 R 18 O2 sat 96% on RA. Dr Molli Posey notified. Dr Molli Posey at bedside to check pt. No new orders given. Cleared for D/C to home.

## 2017-09-27 NOTE — Anesthesia Preprocedure Evaluation (Signed)
Anesthesia Evaluation  Patient identified by MRN, date of birth, ID band Patient awake    Reviewed: Allergy & Precautions, H&P , NPO status , Patient's Chart, lab work & pertinent test results, reviewed documented beta blocker date and time   Airway Mallampati: II  TM Distance: >3 FB Neck ROM: full    Dental no notable dental hx. (+) Poor Dentition, Dental Advidsory Given   Pulmonary neg pulmonary ROS, Current Smoker,    Pulmonary exam normal breath sounds clear to auscultation       Cardiovascular Exercise Tolerance: Good negative cardio ROS   Rhythm:regular Rate:Normal     Neuro/Psych  Headaches, negative neurological ROS  negative psych ROS   GI/Hepatic negative GI ROS, Neg liver ROS, GERD  ,  Endo/Other  negative endocrine ROS  Renal/GU negative Renal ROS  negative genitourinary   Musculoskeletal   Abdominal   Peds  Hematology negative hematology ROS (+)   Anesthesia Other Findings cholecysttitis H&P reports frequent MJ use, pr refuses to address last MJ use Tobacco abuse 1.5 ppd  Reproductive/Obstetrics negative OB ROS                             Anesthesia Physical Anesthesia Plan  ASA: II  Anesthesia Plan: General   Post-op Pain Management:    Induction:   PONV Risk Score and Plan:   Airway Management Planned:   Additional Equipment:   Intra-op Plan:   Post-operative Plan:   Informed Consent: I have reviewed the patients History and Physical, chart, labs and discussed the procedure including the risks, benefits and alternatives for the proposed anesthesia with the patient or authorized representative who has indicated his/her understanding and acceptance.   Dental Advisory Given  Plan Discussed with: CRNA and Anesthesiologist  Anesthesia Plan Comments:         Anesthesia Quick Evaluation

## 2017-09-27 NOTE — Op Note (Signed)
Patient:  [name] Taylor  DOB:  01/05/1985  MRN:  [id number]   Preop Diagnosis: Chronic cholecystitis  Postop Diagnosis: Same  Procedure: Laparoscopic cholecystectomy  Surgeon: Franky Macho, MD  Anes: General endotracheal  Indications: Patient is a 32 year old white female who presents with biliary colic secondary to chronic cholecystitis.  The risks and benefits of the procedure including bleeding, infection, hepatobiliary injury, and the possibility of an open procedure were fully explained to the patient, who gave informed consent.  Procedure note: The patient was placed in supine position.  After induction of general endotracheal anesthesia, the abdomen was prepped and draped using the usual sterile technique with DuraPrep.  Surgical site confirmation was performed.  An infraumbilical incision was made down to the fascia.  A Veress needle was introduced into the abdominal cavity and confirmation of placement was done using the saline drop test.  The abdomen was then insufflated to 16 mmHg pressure.  An 11 mm trocar was introduced into the abdominal cavity under direct visualization without difficulty.  The patient was placed in reverse Trendelenburg position and an additional 11 mm trocar was placed in the epigastric region and 5 mm trochars were placed the right upper quadrant and right flank regions.  The liver was inspected and noted to be within normal limits.  The gallbladder was retracted in a dynamic fashion in order to provide a critical view of the triangle of Calot.  The cystic duct was first identified.  Its juncture to the infundibulum fully identified.  Endoclips were placed proximally and distally on the cystic duct, and the cystic duct was divided.  This was likewise done the cystic artery.  The gallbladder was freed away from the gallbladder fossa using Bovie electrocautery.  The gallbladder was delivered through the epigastric trocar site using an Endo Catch bag.  The  gallbladder fossa was inspected and no abnormal bleeding or bile leakage was noted.  Surgicel was placed in the gallbladder fossa.  All fluid and air were then evacuated from the abdominal cavity prior to the removal of the trochars.  All wounds were irrigated with normal saline.  All wounds were injected with Exparel.  The infraumbilical fascia was reapproximated using an 0 Vicryl interrupted suture.  All skin incisions were closed using staples.  Betadine ointment and dry sterile dressings were applied.  All tape and needle counts were correct at the end of the procedure.  Patient was extubated in the operating room and transferred to PACU in stable condition.  Complications: None  EBL: Minimal  Specimen: Gallbladder

## 2017-09-27 NOTE — Discharge Instructions (Signed)
Laparoscopic Cholecystectomy, Care After °This sheet gives you information about how to care for yourself after your procedure. Your health care provider may also give you more specific instructions. If you have problems or questions, contact your health care provider. °What can I expect after the procedure? °After the procedure, it is common to have: °· Pain at your incision sites. You will be given medicines to control this pain. °· Mild nausea or vomiting. °· Bloating and possible shoulder pain from the air-like gas that was used during the procedure. ° °Follow these instructions at home: °Incision care ° °· Follow instructions from your health care provider about how to take care of your incisions. Make sure you: °? Wash your hands with soap and water before you change your bandage (dressing). If soap and water are not available, use hand sanitizer. °? Change your dressing as told by your health care provider. °? Leave stitches (sutures), skin glue, or adhesive strips in place. These skin closures may need to be in place for 2 weeks or longer. If adhesive strip edges start to loosen and curl up, you may trim the loose edges. Do not remove adhesive strips completely unless your health care provider tells you to do that. °· Do not take baths, swim, or use a hot tub until your health care provider approves. Ask your health care provider if you can take showers. You may only be allowed to take sponge baths for bathing. °· Check your incision area every day for signs of infection. Check for: °? More redness, swelling, or pain. °? More fluid or blood. °? Warmth. °? Pus or a bad smell. °Activity °· Do not drive or use heavy machinery while taking prescription pain medicine. °· Do not lift anything that is heavier than 10 lb (4.5 kg) until your health care provider approves. °· Do not play contact sports until your health care provider approves. °· Do not drive for 24 hours if you were given a medicine to help you relax  (sedative). °· Rest as needed. Do not return to work or school until your health care provider approves. °General instructions °· Take over-the-counter and prescription medicines only as told by your health care provider. °· To prevent or treat constipation while you are taking prescription pain medicine, your health care provider may recommend that you: °? Drink enough fluid to keep your urine clear or pale yellow. °? Take over-the-counter or prescription medicines. °? Eat foods that are high in fiber, such as fresh fruits and vegetables, whole grains, and beans. °? Limit foods that are high in fat and processed sugars, such as fried and sweet foods. °Contact a health care provider if: °· You develop a rash. °· You have more redness, swelling, or pain around your incisions. °· You have more fluid or blood coming from your incisions. °· Your incisions feel warm to the touch. °· You have pus or a bad smell coming from your incisions. °· You have a fever. °· One or more of your incisions breaks open. °Get help right away if: °· You have trouble breathing. °· You have chest pain. °· You have increasing pain in your shoulders. °· You faint or feel dizzy when you stand. °· You have severe pain in your abdomen. °· You have nausea or vomiting that lasts for more than one day. °· You have leg pain. °This information is not intended to replace advice given to you by your health care provider. Make sure you discuss any questions you   have with your health care provider. °Document Released: 12/20/2004 Document Revised: 07/11/2015 Document Reviewed: 06/08/2015 °Elsevier Interactive Patient Education © 2018 [employer] ° ° °General Anesthesia, Adult, Care After °These instructions provide you with information about caring for yourself after your procedure. Your health care provider may also give you more specific instructions. Your treatment has been planned according to current medical practices, but problems sometimes occur.  Call your health care provider if you have any problems or questions after your procedure. °What can I expect after the procedure? °After the procedure, it is common to have: °· Vomiting. °· A sore throat. °· Mental slowness. ° °It is common to feel: °· Nauseous. °· Cold or shivery. °· Sleepy. °· Tired. °· Sore or achy, even in parts of your body where you did not have surgery. ° °Follow these instructions at home: °For at least 24 hours after the procedure: °· Do not: °? Participate in activities where you could fall or become injured. °? Drive. °? Use heavy machinery. °? Drink alcohol. °? Take sleeping pills or medicines that cause drowsiness. °? Make important decisions or sign legal documents. °? Take care of children on your own. °· Rest. °Eating and drinking °· If you vomit, drink water, juice, or soup when you can drink without vomiting. °· Drink enough fluid to keep your urine clear or pale yellow. °· Make sure you have little or no nausea before eating solid foods. °· Follow the diet recommended by your health care provider. °General instructions °· Have a responsible adult stay with you until you are awake and alert. °· Return to your normal activities as told by your health care provider. Ask your health care provider what activities are safe for you. °· Take over-the-counter and prescription medicines only as told by your health care provider. °· If you smoke, do not smoke without supervision. °· Keep all follow-up visits as told by your health care provider. This is important. °Contact a health care provider if: °· You continue to have nausea or vomiting at home, and medicines are not helpful. °· You cannot drink fluids or start eating again. °· You cannot urinate after 8-12 hours. °· You develop a skin rash. °· You have fever. °· You have increasing redness at the site of your procedure. °Get help right away if: °· You have difficulty breathing. °· You have chest pain. °· You have unexpected  bleeding. °· You feel that you are having a life-threatening or urgent problem. °This information is not intended to replace advice given to you by your health care provider. Make sure you discuss any questions you have with your health care provider. °Document Released: 03/28/2000 Document Revised: 05/25/2015 Document Reviewed: 12/04/2014 °Elsevier Interactive Patient Education © 2018 [employer] ° °

## 2017-09-27 NOTE — Interval H&P Note (Signed)
History and Physical Interval Note:  09/27/2017 8:16 AM  [name] Taylor  has presented today for surgery, with the diagnosis of chronic cholecystitis  The various methods of treatment have been discussed with the patient and family. After consideration of risks, benefits and other options for treatment, the patient has consented to  Procedure(s): LAPAROSCOPIC CHOLECYSTECTOMY (N/A) as a surgical intervention .  The patient's history has been reviewed, patient examined, no change in status, stable for surgery.  I have reviewed the patient's chart and labs.  Questions were answered to the patient's satisfaction.     Franky Macho

## 2017-09-28 ENCOUNTER — Encounter (HOSPITAL_COMMUNITY): Payer: Self-pay | Admitting: General Surgery

## 2017-09-30 ENCOUNTER — Emergency Department (HOSPITAL_COMMUNITY)
Admission: EM | Admit: 2017-09-30 | Discharge: 2017-09-30 | Disposition: A | Discharge status: Home / Self Care | Payer: BLUE CROSS/BLUE SHIELD | Attending: Emergency Medicine | Admitting: Emergency Medicine

## 2017-09-30 ENCOUNTER — Encounter (HOSPITAL_COMMUNITY): Payer: Self-pay | Admitting: Emergency Medicine

## 2017-09-30 ENCOUNTER — Other Ambulatory Visit: Payer: Self-pay

## 2017-09-30 DIAGNOSIS — F17210 Nicotine dependence, cigarettes, uncomplicated: Secondary | ICD-10-CM | POA: Insufficient documentation

## 2017-09-30 DIAGNOSIS — R21 Rash and other nonspecific skin eruption: Secondary | ICD-10-CM | POA: Diagnosis not present

## 2017-09-30 DIAGNOSIS — L309 Dermatitis, unspecified: Secondary | ICD-10-CM | POA: Insufficient documentation

## 2017-09-30 DIAGNOSIS — Z79899 Other long term (current) drug therapy: Secondary | ICD-10-CM | POA: Diagnosis not present

## 2017-09-30 DIAGNOSIS — L270 Generalized skin eruption due to drugs and medicaments taken internally: Secondary | ICD-10-CM | POA: Diagnosis not present

## 2017-09-30 MED ORDER — HYDROXYZINE PAMOATE 25 MG PO CAPS: 25.0000 mg | ORAL_CAPSULE | ORAL | 0 refills | Status: DC | PRN

## 2017-09-30 MED ORDER — PREDNISONE 20 MG PO TABS: 40.0000 mg | ORAL_TABLET | Freq: Every day | ORAL | 0 refills | Status: DC

## 2017-09-30 MED ORDER — HYDROXYZINE HCL 25 MG PO TABS
25.0000 mg | ORAL_TABLET | Freq: Once | ORAL | Status: AC
  Administered 2017-09-30: 25 mg via ORAL
  Filled 2017-09-30: qty 1

## 2017-09-30 MED ORDER — PREDNISONE 50 MG PO TABS
60.0000 mg | ORAL_TABLET | Freq: Once | ORAL | Status: AC
  Administered 2017-09-30: 60 mg via ORAL
  Filled 2017-09-30: qty 1

## 2017-09-30 NOTE — Discharge Instructions (Addendum)
Please stop the hydrocodone until you are seen by your physician.  Please use prednisone daily with a meal.  Please use Vistaril every 4-6 hours as needed for itching.  Return to the emergency department if there is extension or worsening of your reaction.  Vistaril may cause drowsiness.  Please do not drive a vehicle, operate machinery, drink alcohol, or participate in activities requiring concentration when taking this medication.

## 2017-09-30 NOTE — ED Triage Notes (Signed)
Pt reports having her gallbladder taken out on Wednesday. Pt states when she removed the bandage from the incision she noticed a rash and feels like "her body is burning." Pt states she has taken 1 benadryl with no relief.
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
## 2017-10-05 ENCOUNTER — Ambulatory Visit (INDEPENDENT_AMBULATORY_CARE_PROVIDER_SITE_OTHER): Payer: Self-pay | Admitting: General Surgery

## 2017-10-05 ENCOUNTER — Encounter: Payer: Self-pay | Admitting: General Surgery

## 2017-10-05 VITALS — BP 124/78 | HR 71 | Temp 98.2°F | Resp 18 | Wt 140.0 lb

## 2017-10-05 DIAGNOSIS — Z09 Encounter for follow-up examination after completed treatment for conditions other than malignant neoplasm: Secondary | ICD-10-CM

## 2017-10-05 NOTE — Progress Notes (Signed)
Subjective:     [name] Taylor  Status post laparoscopic cholecystectomy.  Doing well.  Her preoperative symptoms have resolved. Objective:    BP 124/78 (BP Location: Left Arm, Patient Position: Sitting, Cuff Size: Normal)   Pulse 71   Temp 98.2 F (36.8 C) (Temporal)   Resp 18   Wt 140 lb (63.5 kg)   BMI 26.45 kg/m   General:  alert, cooperative and no distress  Abdomen soft, incisions healing well.  Staples removed, Steri-Strips applied. Final pathology consistent with diagnosis.     Assessment:    Doing well postoperatively.    Plan:   May return to work without restrictions on 10/16/2017.  Follow-up here as needed.

## 2017-11-09 ENCOUNTER — Encounter: Payer: Self-pay | Admitting: Family Medicine

## 2017-11-09 ENCOUNTER — Ambulatory Visit (INDEPENDENT_AMBULATORY_CARE_PROVIDER_SITE_OTHER): Payer: BLUE CROSS/BLUE SHIELD | Admitting: Family Medicine

## 2017-11-09 VITALS — BP 118/72 | Ht 62.5 in | Wt 142.8 lb

## 2017-11-09 DIAGNOSIS — D72829 Elevated white blood cell count, unspecified: Secondary | ICD-10-CM

## 2017-11-09 DIAGNOSIS — Z0000 Encounter for general adult medical examination without abnormal findings: Secondary | ICD-10-CM

## 2017-11-09 DIAGNOSIS — L259 Unspecified contact dermatitis, unspecified cause: Secondary | ICD-10-CM | POA: Diagnosis not present

## 2017-11-09 DIAGNOSIS — Z113 Encounter for screening for infections with a predominantly sexual mode of transmission: Secondary | ICD-10-CM | POA: Diagnosis not present

## 2017-11-09 DIAGNOSIS — K219 Gastro-esophageal reflux disease without esophagitis: Secondary | ICD-10-CM

## 2017-11-09 MED ORDER — TRIAMCINOLONE ACETONIDE 0.1 % EX CREA: 1.0000 "application " | TOPICAL_CREAM | Freq: Two times a day (BID) | CUTANEOUS | 1 refills | Status: DC

## 2017-11-09 MED ORDER — PANTOPRAZOLE SODIUM 40 MG PO TBEC: 40.0000 mg | DELAYED_RELEASE_TABLET | Freq: Every day | ORAL | 2 refills | Status: DC

## 2017-11-09 NOTE — Progress Notes (Signed)
Subjective:    Patient ID: [name] Taylor, female    DOB: 12/21/1985, 32 y.o.   MRN: [id number]  HPI The patient comes in today for a wellness visit.  A review of their health history was completed. A review of medications was also completed.  Any needed refills: protonix  Eating habits: pt works 3rd shift, only eats two times a day   Falls/  MVA accidents in past few months: none  Regular exercise: no  Specialist pt sees on regular basis: none  Preventative health issues were discussed. Has had pap [facility] year [facility], normal. Will request records. Reports regular dental exams. Has not had vision exam - reports squinting and h/a when trying to focus/read things at work. No red flags noted. States she is getting vision insurance the end of this month and will schedule eye appt.  Additional concerns: Pt has red spots on both forearms and they do itch x 1 month. 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  Denies any new potential allergens, has tried hydrocortisone cream without relief.   Implant for birth control - does not have bleeding. One partner, sexually active, denies condom use. Would like testing for STDs today - only wants GC/Chlamydia testing today. Denies any symptoms.  GERD: was rx protonix in the hospital initially. Now taking one tablet every two days or so for heartburn symptoms. Relieves symptoms. Requesting refill.  Current everyday smoker, not ready to quit yet.   Review of Systems  Constitutional: Negative for chills, fatigue, fever and unexpected weight change.  HENT: Negative for congestion, ear pain, sinus pressure, sinus pain and sore throat.   Eyes: Negative for discharge.  Respiratory: Negative for cough, shortness of breath and wheezing.   Cardiovascular: Negative for chest pain and leg swelling.  Gastrointestinal: Negative for abdominal pain, blood in stool,  constipation, diarrhea, nausea and vomiting.  Genitourinary: Negative for difficulty urinating, hematuria, menstrual problem, pelvic pain, vaginal bleeding, vaginal discharge and vaginal pain.  Skin: Positive for rash (See HPI).  Neurological: Negative for dizziness, weakness and light-headedness.  Psychiatric/Behavioral: Negative for dysphoric mood and suicidal ideas.  All other systems reviewed and are negative.      Objective:   Physical Exam  Constitutional: She is oriented to person, place, and time. She appears well-developed and well-nourished. No distress.  HENT:  Head: Normocephalic and atraumatic.  Right Ear: Tympanic membrane normal.  Left Ear: Tympanic membrane normal.  Nose: Nose normal.  Mouth/Throat: Uvula is midline and oropharynx is clear and moist.  Eyes: Pupils are equal, round, and reactive to light. Conjunctivae and EOM are normal. Right eye exhibits no discharge. Left eye exhibits no discharge.  Neck: Neck supple. No thyromegaly present.  Cardiovascular: Normal rate, regular rhythm and normal heart sounds.  No murmur heard. Pulmonary/Chest: Effort normal and breath sounds normal. No respiratory distress. She has no wheezes. Right breast exhibits no inverted nipple, no mass, no nipple discharge, no skin change and no tenderness. Left breast exhibits no inverted nipple, no mass, no nipple discharge, no skin change and no tenderness.  Abdominal: Soft. Bowel sounds are normal. She exhibits no distension and no mass. There is no tenderness.  Genitourinary: Vagina normal and uterus normal. There is no rash, tenderness or lesion on the right labia. There is no rash, tenderness or lesion on the left labia. Cervix exhibits no motion tenderness. Right adnexum displays no mass and  no tenderness. Left adnexum displays no mass and no tenderness.  Genitourinary Comments: Chaperone present.  Musculoskeletal: She exhibits no edema or deformity.  Lymphadenopathy:    She has no  cervical adenopathy.  Neurological: She is alert and oriented to person, place, and time. Coordination normal.  Skin: Skin is warm and dry. Rash (pink/red maculopapular rash noted to bilateral forearms) noted.  Psychiatric: She has a normal mood and affect.  Nursing note and vitals reviewed.     Assessment & Plan:  1. Wellness examination Adult wellness-complete.wellness physical was conducted today. Importance of diet and exercise were discussed in detail.  In addition to this a discussion regarding safety was also covered. We also reviewed over immunizations and gave recommendations regarding current immunization needed for age.   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 Normal, no hx of abnormal pap.  Will request records.  Patient was advised yearly wellness exam. Will get screening labs today: lipid and met 7  2. Contact dermatitis, unspecified contact dermatitis type, unspecified trigger Rash to bilateral forearms likely contact dermatitis, recommend wearing more protection to cover arms at work, will try triamcinolone cream, if no improvement in the next 2-3 weeks will likely refer to dermatology or try a stronger cream.  3. Leukocytosis, unspecified type Will repeat CBC, leukocytosis noted in august prior to cholecystectomy.  4. Screening examination for STD (sexually transmitted disease) - Plan: GC/Chlamydia Probe Amp([facility])  5. GERD: will refill protonix, discussed occasional use only, discouraged daily long-term use. If symptoms not well controlled will f/u.  Dr. Sallee Lange was consulted on this case and is in agreement with the above treatment plan.

## 2017-11-11 LAB — SPECIMEN STATUS REPORT

## 2017-11-11 LAB — GC/CHLAMYDIA PROBE AMP
Chlamydia trachomatis, NAA: NEGATIVE
Neisseria gonorrhoeae by PCR: NEGATIVE

## 2017-11-17 ENCOUNTER — Telehealth: Payer: Self-pay | Admitting: Family Medicine

## 2017-11-17 MED ORDER — MOMETASONE FUROATE 0.1 % EX CREA: TOPICAL_CREAM | CUTANEOUS | 0 refills | Status: DC

## 2017-11-17 NOTE — Telephone Encounter (Signed)
Patient Is aware of all. Medication sent to the requested pharmacy.

## 2017-11-17 NOTE — Telephone Encounter (Signed)
Pt was seen on 11/09/17 and was prescribed a cream for a rash and it is getting bigger and is wanting to know if something else can be recommend or if she would need to come back in for a recheck. If something else is called in [facility] [phone] - [place], [place] - [address] AT [facility] [address]   Also she had a pap at that appt and has been spotting a little on and off and would like to make sure that is normal.   CB# [phone].

## 2017-11-17 NOTE — Telephone Encounter (Signed)
Spotting normal after pap, switch to elocon cr 15 g bid affected area

## 2017-11-17 NOTE — Telephone Encounter (Signed)
Patient diagnosed with contact dermatitis and given kenalog cream

## 2018-05-15 ENCOUNTER — Other Ambulatory Visit: Payer: Self-pay | Admitting: *Deleted

## 2018-05-15 MED ORDER — PANTOPRAZOLE SODIUM 40 MG PO TBEC: 40.0000 mg | DELAYED_RELEASE_TABLET | Freq: Every day | ORAL | 2 refills | Status: DC

## 2018-06-14 ENCOUNTER — Other Ambulatory Visit: Payer: Self-pay

## 2018-06-14 ENCOUNTER — Ambulatory Visit (INDEPENDENT_AMBULATORY_CARE_PROVIDER_SITE_OTHER): Payer: BC Managed Care – PPO | Admitting: Family Medicine

## 2018-06-14 DIAGNOSIS — R21 Rash and other nonspecific skin eruption: Secondary | ICD-10-CM

## 2018-06-14 MED ORDER — MOMETASONE FUROATE 0.1 % EX CREA: TOPICAL_CREAM | CUTANEOUS | 3 refills | Status: DC

## 2018-06-14 NOTE — Progress Notes (Signed)
   Subjective:    Patient ID: [name] Taylor, female    DOB: Feb 01, 1985, 33 y.o.   MRN: [id number]  Rash This is a new problem. Episode onset: one and a half weeks ago. Location: both wrist and arms.  has had rash in the past and was told it was from something she was using at work. Was given elocon cream and it did clear it up.   Virtual Visit via Video Note  I connected with SHAVONA GUNDERMAN on 06/14/18 at  1:10 PM EDT by a video enabled telemedicine application and verified that I am speaking with the correct person using two identifiers.  Location: Patient: home Provider: office   I discussed the limitations of evaluation and management by telemedicine and the availability of in person appointments. The patient expressed understanding and agreed to proceed.  History of Present Illness:    Observations/Objective:   Assessment and Plan:   Follow Up Instructions:    I discussed the assessment and treatment plan with the patient. The patient was provided an opportunity to ask questions and all were answered. The patient agreed with the plan and demonstrated an understanding of the instructions.   The patient was advised to call back or seek an in-person evaluation if the symptoms worsen or if the condition fails to improve as anticipated.  I provided 9minutes of non-face-to-face time during this encounter.    Patient through workplace has 6 substantial exposure to accelerant.  It is solvents which patient finds irritating.  She developed a bumpy erythematous rash.  Has 1 now.  Both wrists.  Very pruritic.  Early blisters also  Review of Systems  Skin: Positive for rash.  No fever no vomiting    Objective:   Physical Exam Virtual visit  Rash difficult to visualize through camera       Assessment & Plan:  Impression contact dermatitis with industrial solvent.  Measures to reduce exposure discussed Elocon cream refilled plus refills worked well in the past.

## 2018-06-19 ENCOUNTER — Other Ambulatory Visit: Payer: Self-pay | Admitting: *Deleted

## 2018-06-19 ENCOUNTER — Telehealth: Payer: Self-pay | Admitting: Family Medicine

## 2018-06-19 MED ORDER — PREDNISONE 20 MG PO TABS: ORAL_TABLET | ORAL | 0 refills | Status: DC

## 2018-06-19 NOTE — Telephone Encounter (Signed)
Has she tried either cream on this rash? Just recently prescribed Elocon Previously was prescribed triamcinolone

## 2018-06-19 NOTE — Telephone Encounter (Signed)
Discussed with pt. Pt verbalized understanding. Med sent to pharm.  

## 2018-06-19 NOTE — Telephone Encounter (Signed)
I would recommend trying a short course of prednisone 20 mg, 2 tablets daily for the next 5 days, may continue to use Elocon cream as needed, if not improving over the next several days next step would be referral to dermatology

## 2018-06-19 NOTE — Telephone Encounter (Signed)
Pt was seen 6/11 for rash on wrist and arm. She still has rash on her wrist but it is improving. Her arm has cleared up. She now has a rash on the bottom of her legs though and it itches really bad. She is wanting some advice on what to do.

## 2018-06-19 NOTE — Telephone Encounter (Signed)
Tried elocon

## 2018-06-19 NOTE — Telephone Encounter (Signed)
Rash on leg looks different than the one on her arm. She did use a different shaving cream. She did try the cream that was given to her for her arms on her legs and it looks better. Very itchy, no bumps just redness and also some patches of redness came up on chest.   [facility] [address]

## 2018-10-08 ENCOUNTER — Telehealth: Payer: Self-pay | Admitting: Family Medicine

## 2018-10-08 MED ORDER — PANTOPRAZOLE SODIUM 40 MG PO TBEC: 40.0000 mg | DELAYED_RELEASE_TABLET | Freq: Every day | ORAL | 2 refills | Status: DC

## 2018-10-08 NOTE — Telephone Encounter (Signed)
Pharmacy requesting refill on Pantoprazole 40 mg tablets. Take one tablet by mouth every day. Pt last seen 06/14/2018 for rash. Please advise. Thank you

## 2018-10-08 NOTE — Telephone Encounter (Signed)
She may have 30 with 2 refills

## 2018-10-08 NOTE — Telephone Encounter (Signed)
Prescription sent electronically to pharmacy. 

## 2018-10-16 ENCOUNTER — Telehealth: Payer: Self-pay | Admitting: Adult Health

## 2018-10-16 NOTE — Telephone Encounter (Signed)

## 2018-10-17 ENCOUNTER — Encounter: Payer: Self-pay | Admitting: Adult Health

## 2018-10-17 ENCOUNTER — Other Ambulatory Visit: Payer: Self-pay

## 2018-10-17 ENCOUNTER — Ambulatory Visit (INDEPENDENT_AMBULATORY_CARE_PROVIDER_SITE_OTHER): Payer: BC Managed Care – PPO | Admitting: Adult Health

## 2018-10-17 VITALS — BP 103/59 | HR 55 | Ht 61.0 in | Wt 142.0 lb

## 2018-10-17 DIAGNOSIS — Z3046 Encounter for surveillance of implantable subdermal contraceptive: Secondary | ICD-10-CM | POA: Diagnosis not present

## 2018-10-17 MED ORDER — PRENATAL PLUS 27-1 MG PO TABS: 1.0000 | ORAL_TABLET | Freq: Every day | ORAL | 12 refills | Status: DC

## 2018-10-17 NOTE — Progress Notes (Signed)
  Subjective:     Patient ID: [name] Taylor, female   DOB: 1985-06-30, 33 y.o.   MRN: [id number]  HPI Jamiah is a 33 year old white female, engaged, [phone], in for a nexplanon removal, she would like to get pregnant  PCP is [facility].  Review of Systems Wants nexplanon removal, wants to get pregnant  Reviewed past medical,surgical, social and family history. Reviewed medications and allergies.     Objective:   Physical Exam BP (!) 103/59 (BP Location: Left Arm, Patient Position: Sitting, Cuff Size: Normal)   Pulse (!) 55   Ht 5\' 1"  (1.549 m)   Wt 142 lb (64.4 kg)   BMI 26.83 kg/m  Consent signed, time out called. Left arm cleansed with betadine, and injected with 1.5 cc 1% lidocaine and waited til numb.Under sterile technique a #11 blade was used to make small vertical incision, and a curved forceps was used to easily remove rod. Steri strips applied. Pressure dressing applied.    Assessment:     1. Encounter for Nexplanon removal       Plan:     - keep clean and dry x 24 hours, no heavy lifting, keep steri strips on x 72 hours, Keep pressure dressing on x 24 hours.   -will rx PNV Meds ordered this encounter  Medications  . prenatal vitamin w/FE, FA (PRENATAL 1 + 1) 27-1 MG TABS tablet    Sig: Take 1 tablet by mouth daily at 12 noon.    Dispense:  30 tablet    Refill:  12    Order Specific Question:   Supervising Provider    Answer:   Florian Buff [[id number]]  -pap and physical in December as scheduled

## 2018-10-17 NOTE — Patient Instructions (Signed)
,   keep clean and dry x 24 hours, no heavy lifting, keep steri strips on x 72 hours, Keep pressure dressing on x 24 hours. Start PNV Pap in December

## 2018-11-12 ENCOUNTER — Telehealth: Payer: Self-pay | Admitting: Family Medicine

## 2018-11-12 DIAGNOSIS — R21 Rash and other nonspecific skin eruption: Secondary | ICD-10-CM

## 2018-11-12 NOTE — Telephone Encounter (Signed)
Tried to call again and no answer

## 2018-11-12 NOTE — Telephone Encounter (Signed)
Patient is requesting refill on triamcinolone cream for rash on wrist she was last seen for this 06/14/2018.[address]

## 2018-11-12 NOTE — Telephone Encounter (Signed)
Tried to call no answer. Voicemail full.  

## 2018-11-12 NOTE — Telephone Encounter (Signed)
Tried to call 3 times and phone was not working to get more information.

## 2018-11-13 ENCOUNTER — Other Ambulatory Visit: Payer: Self-pay | Admitting: *Deleted

## 2018-11-13 MED ORDER — MOMETASONE FUROATE 0.1 % EX CREA: TOPICAL_CREAM | CUTANEOUS | 1 refills | Status: DC

## 2018-11-13 NOTE — Telephone Encounter (Signed)
Med sent to pharm and tried to call pt no answer.

## 2018-11-13 NOTE — Telephone Encounter (Signed)
Called pt and she states she has more spots on her arms and one on her face. Same as when she was seen in June. Would like refill on the cream she got last which looks like elocon not triamcinolone. She also wants to know if she can use elocon on her face as well.   Note copied form June visit below: Impression contact dermatitis with industrial solvent.  Measures to reduce exposure discussed Elocon cream refilled plus refills worked well in the past

## 2018-11-13 NOTE — Telephone Encounter (Signed)
May use generic Elocon on the arms legs but not on face it is too strong for the face Generic Elocon 0.1% apply twice daily as needed 30 g to 1 refill  If the areas seem to be getting worse consideration for dermatology consult can be done as well If patient interested please go ahead with referral

## 2018-11-13 NOTE — Telephone Encounter (Signed)
Discussed with pt. Pt started asking what she could put on her face and also she has a sore on her nose she wanted something for. Told pt she needs visit for dr scott to look at. Transferred to the front to schedule appt. She also wanted appt with dermatology so referral put in.

## 2018-11-14 ENCOUNTER — Other Ambulatory Visit: Payer: Self-pay

## 2018-11-14 ENCOUNTER — Ambulatory Visit (INDEPENDENT_AMBULATORY_CARE_PROVIDER_SITE_OTHER): Payer: BC Managed Care – PPO | Admitting: Family Medicine

## 2018-11-14 DIAGNOSIS — R21 Rash and other nonspecific skin eruption: Secondary | ICD-10-CM | POA: Diagnosis not present

## 2018-11-14 NOTE — Progress Notes (Signed)
   Subjective:    Patient ID: [name] Taylor, female    DOB: [birth year], 33 y.o.   MRN: [id number]  Rash This is a new problem. The current episode started 1 to 4 weeks ago. Location: spot on nose, around eye. The rash is characterized by dryness and burning. Treatments tried: vasoline. The treatment provided mild relief.  pt stopped wearing paper masks and is now wearing cloth mask.   Virtual Visit via Video Note  I connected with BENICIA BERGEVIN on 11/14/18 at  1:10 PM EST by a video enabled telemedicine application and verified that I am speaking with the correct person using two identifiers.  Location: Patient: home Provider: office   I discussed the limitations of evaluation and management by telemedicine and the availability of in person appointments. The patient expressed understanding and agreed to proceed.  History of Present Illness:    Observations/Objective:   Assessment and Plan:   Follow Up Instructions:    I discussed the assessment and treatment plan with the patient. The patient was provided an opportunity to ask questions and all were answered. The patient agreed with the plan and demonstrated an understanding of the instructions.   The patient was advised to call back or seek an in-person evaluation if the symptoms worsen or if the condition fails to improve as anticipated.  I provided 15 minutes of non-face-to-face time during this encounter.   Vicente Males, LPN   Review of Systems  Skin: Positive for rash.       Objective:   Physical Exam  Patient had virtual visit Appears to be in no distress Atraumatic Neuro able to relate and oriented No apparent resp distress Color normal       Assessment & Plan:  It was very difficult to see on the video image lesions going on her face I recommend that she send me some pictures but she was unable to send pictures  We will connect with her and let her know that she can come by the office at any time  to try to allow me to look at the spots to help guide her appropriately

## 2018-11-15 NOTE — Progress Notes (Signed)
Tried to contact patient; pt voicemail is full °

## 2018-11-27 ENCOUNTER — Encounter: Payer: Self-pay | Admitting: Family Medicine

## 2018-11-27 NOTE — Progress Notes (Signed)
Tried to call no answer. Was able to leave phone number for her to call back on

## 2018-11-28 NOTE — Progress Notes (Signed)
Also see message on her son danny neal in pt's calls

## 2018-11-30 NOTE — Progress Notes (Signed)
Unable to get in touch with patient; may we complete this message. Please advise. Thank you

## 2018-12-02 NOTE — Progress Notes (Signed)
This message may be filed thank you

## 2018-12-17 ENCOUNTER — Telehealth: Payer: Self-pay | Admitting: Adult Health

## 2018-12-17 NOTE — Telephone Encounter (Signed)

## 2018-12-18 ENCOUNTER — Other Ambulatory Visit: Payer: BC Managed Care – PPO | Admitting: Adult Health

## 2018-12-18 ENCOUNTER — Other Ambulatory Visit: Payer: BLUE CROSS/BLUE SHIELD | Admitting: Women's Health

## 2019-04-18 ENCOUNTER — Other Ambulatory Visit: Payer: Self-pay

## 2019-04-18 ENCOUNTER — Ambulatory Visit (INDEPENDENT_AMBULATORY_CARE_PROVIDER_SITE_OTHER): Payer: BC Managed Care – PPO | Admitting: Family Medicine

## 2019-04-18 DIAGNOSIS — L0100 Impetigo, unspecified: Secondary | ICD-10-CM | POA: Diagnosis not present

## 2019-04-18 MED ORDER — MUPIROCIN 2 % EX OINT: TOPICAL_OINTMENT | CUTANEOUS | 0 refills | Status: DC

## 2019-04-18 MED ORDER — CEPHALEXIN 500 MG PO CAPS: 500.0000 mg | ORAL_CAPSULE | Freq: Three times a day (TID) | ORAL | 0 refills | Status: DC

## 2019-04-18 NOTE — Patient Instructions (Signed)
Impetigo, Adult Impetigo is an infection of the skin. It commonly occurs in young children, but it can also occur in adults. The infection causes itchy blisters and sores that produce brownish-yellow fluid. As the fluid dries, it forms a thick, honey-colored crust. These skin changes usually occur on the face, but they can also affect other areas of the body. Impetigo usually goes away in 7-10 days with treatment. What are the causes? This condition is caused by two types of bacteria. It may be caused by staphylococci or streptococci bacteria. These bacteria cause impetigo when they get under the surface of the skin. This often happens after some damage to the skin, such as:  Cuts, scrapes, or scratches.  Rashes.  Insect bites, especially when you scratch the area of a bite.  Chickenpox or other illnesses that cause open skin sores.  Nail biting or chewing. Impetigo can spread easily from one person to another (is contagious). It may be spread through close skin contact or by sharing towels, clothing, or other items that an infected person has touched. What increases the risk? The following factors may make you more likely to develop this condition:  Playing sports that include skin-to-skin contact with others.  Having a skin condition with open sores, such as chickenpox.  Having diabetes.  Having a weak body defense system (immune system).  Having many skin cuts or scrapes.  Living in an area that has high humidity levels.  Having poor hygiene.  Having high levels of staphylococci in your nose. What are the signs or symptoms? The main symptom of this condition is small blisters, often on the face around the mouth and nose. In time, the blisters break open and turn into tiny sores (lesions) with a yellow crust. In some cases, the blisters cause itching or burning. With scratching, irritation, or lack of treatment, these small lesions may get larger. Other possible symptoms  include:  Larger blisters.  Pus.  Swollen lymph glands. Scratching the affected area can cause impetigo to spread to other parts of the body. The bacteria can get under the fingernails and spread when you touch another area of your skin. How is this diagnosed? This condition is usually diagnosed during a physical exam. A skin sample or a sample of fluid from a blister may be taken for lab tests that involve growing bacteria (culture test). Lab tests can help to confirm the diagnosis or help to determine the best treatment. How is this treated? Treatment for this condition depends on the severity of the condition:  Mild impetigo can be treated with prescription antibiotic cream.  Oral antibiotic medicine may be used in more severe cases.  Medicines that reduce itchiness (antihistamines)may also be used. Follow these instructions at home: Medicines  Take over-the-counter and prescription medicines only as told by your health care provider.  Apply or take your antibiotic as told by your health care provider. Do not stop using the antibiotic even if your condition improves. General instructions   To help prevent impetigo from spreading to other body areas: ? Keep your fingernails short and clean. ? Do not scratch the blisters or sores. ? Cover infected areas, if necessary, to keep from scratching. ? Wash your hands often with soap and warm water.  Before applying antibiotic cream or ointment, you should: ? Gently wash the infected areas with antibacterial soap and warm water. ? Soak crusted areas in warm, soapy water using antibacterial soap. ? Gently rub the areas to remove crusts. Do not   scrub.  Do not share towels.  Wash your clothing and bedsheets in warm water that is 140F (60C) or warmer.  Stay home until you have used an antibiotic cream for 48 hours (2 days) or an oral antibiotic medicine for 24 hours (1 day). You should only return to work and activities with other  people if your skin shows significant improvement. ? You may return to contact sports after you have used antibiotic medicine for 72 hours (3 days).  Keep all follow-up visits as told by your health care provider. This is important. How is this prevented?  Wash your hands often with soap and warm water.  Do not share towels, washcloths, clothing, bedding, or razors.  Keep your fingernails short.  Keep any cuts, scrapes, bug bites, or rashes clean and covered.  Use insect repellent to prevent bug bites. Contact a health care provider if:  You develop more blisters or sores even with treatment.  Other family members get sores.  Your skin sores are not improving after 72 hours (3 days) of treatment.  You have a fever. Get help right away if:  You see spreading redness or swelling of the skin around your sores.  You see red streaks coming from your sores.  You develop a sore throat.  The area around your rash becomes warm, red, or tender to the touch.  You have dark, reddish-brown urine.  You do not urinate often or you urinate small amounts.  You are very tired (lethargic).  You have swelling in the face, hands, or feet. Summary  Impetigo is a skin infection that causes itchy blisters and sores that produce brownish-yellow fluid. As the fluid dries, it forms a crust.  This condition is caused by staphylococci or streptococci bacteria. These bacteria cause impetigo when they get under the surface of the skin, such as through cuts, rashes, bug bites, or open sores.  Treatment for this condition may include antibiotic ointment or oral antibiotics.  To help prevent impetigo from spreading to other body areas, make sure you keep your fingernails short, avoid scratching, cover any blisters, and wash your hands often.  If you have impetigo, stay home until you have used an antibiotic cream for 48 hours (2 days) or an oral antibiotic medicine for 24 hours (1 day). You should  only return to work and activities with other people if your skin shows significant improvement. This information is not intended to replace advice given to you by your health care provider. Make sure you discuss any questions you have with your health care provider. Document Revised: 01/30/2018 Document Reviewed: 01/12/2016 Elsevier Patient Education  2020 [employer]  

## 2019-04-18 NOTE — Progress Notes (Signed)
   Subjective:    Patient ID: [name] Taylor, female    DOB: [birth year], 34 y.o.   MRN: [id number] Initially virtual patient was brought to the office impossible to examine via virtual HPI Patient calls in today with complaints of rash and irritation on nose and forehead which she believes to be from wearing a mask. Patient states it looks like a bruise and is itching. Patient relates itching burning discomfort on the forehead and on the nose denies any other particular troubles this been going on for the about the past week and a half Virtual Visit via Video Note  I connected with [name] Taylor on 04/18/19 at  9:00 AM EDT by a video enabled telemedicine application and verified that I am speaking with the correct person using two identifiers.  Location: Patient: home Provider: office   I discussed the limitations of evaluation and management by telemedicine and the availability of in person appointments. The patient expressed understanding and agreed to proceed.  History of Present Illness:    Observations/Objective:   Assessment and Plan:   Follow Up Instructions:    I discussed the assessment and treatment plan with the patient. The patient was provided an opportunity to ask questions and all were answered. The patient agreed with the plan and demonstrated an understanding of the instructions.   The patient was advised to call back or seek an in-person evaluation if the symptoms worsen or if the condition fails to improve as anticipated.  I provided 15 minutes of non-face-to-face time during this encounter.  Patient will She is patient will come to the office for further evaluation unable to do this virtually  Review of Systems    Some tenderness to the touch denies high fever chills denies sweats headaches nausea vomiting Objective:   Physical Exam  On physical exam is consistent with impetigo as well as on the forehead with some element of cellulitis      Assessment  & Plan:  Impetigo with some element of cellulitis should get better with antibiotics Keflex for the next 10 days Bactroban ointment twice daily follow-up if progressive troubles call us if any problems

## 2019-08-12 ENCOUNTER — Other Ambulatory Visit: Payer: Self-pay

## 2019-08-12 ENCOUNTER — Encounter: Payer: Self-pay | Admitting: Family Medicine

## 2019-08-12 ENCOUNTER — Ambulatory Visit: Payer: BC Managed Care – PPO | Admitting: Family Medicine

## 2019-08-12 VITALS — BP 112/70 | HR 58 | Temp 97.5°F | Ht 61.0 in | Wt 155.8 lb

## 2019-08-12 DIAGNOSIS — R5383 Other fatigue: Secondary | ICD-10-CM

## 2019-08-12 DIAGNOSIS — K219 Gastro-esophageal reflux disease without esophagitis: Secondary | ICD-10-CM

## 2019-08-12 DIAGNOSIS — R109 Unspecified abdominal pain: Secondary | ICD-10-CM | POA: Diagnosis not present

## 2019-08-12 LAB — POCT URINE PREGNANCY: Preg Test, Ur: NEGATIVE

## 2019-08-12 MED ORDER — PANTOPRAZOLE SODIUM 40 MG PO TBEC: 40.0000 mg | DELAYED_RELEASE_TABLET | Freq: Every day | ORAL | 1 refills | Status: DC

## 2019-08-12 NOTE — Progress Notes (Signed)
Patient ID: SAMAN GIDDENS, female    DOB: June 24, 1985, 34 y.o.   MRN: [id number]   Chief Complaint  Patient presents with  . Gastroesophageal Reflux   Subjective:    HPI  Pt here for acid reflux issues. Pt states her indigestion has become worse in the past week. Pt is having burning in her throat, stomach feels bloated, right side pain. Pt had gallbladder removed last year. Pt has been taking Protonix but is no longer helping. Laying down makes indigestion worse.  Reports just not feeling good for one week with RLQ pain. Denies fever, nausea or vomiting. Menstrual cycle due anyday.   Medical History Cyanna has a past medical history of Burning with urination (10/02/2013), Chronic headaches, Chronic leg pain, GERD (gastroesophageal reflux disease), Hematuria (10/02/2013), LLQ pain (10/02/2013), Marijuana use, and Vaginal discharge (10/02/2013).   Outpatient Encounter Medications as of 08/12/2019  Medication Sig  . mometasone (ELOCON) 0.1 % cream Use BID on affected area prn. Do NOT use on face.  . mupirocin ointment (BACTROBAN) 2 % Apply BID to affected area.  . pantoprazole (PROTONIX) 40 MG tablet Take 1 tablet (40 mg total) by mouth daily.  Marland Kitchen triamcinolone cream (KENALOG) 0.1 % Apply 1 application topically 2 (two) times daily.  . [DISCONTINUED] pantoprazole (PROTONIX) 40 MG tablet Take 1 tablet (40 mg total) by mouth daily.  . [DISCONTINUED] cephALEXin (KEFLEX) 500 MG capsule Take 1 capsule (500 mg total) by mouth 3 (three) times daily.  . [DISCONTINUED] Multiple Vitamin (MULTIVITAMIN WITH MINERALS) TABS tablet Take 1 tablet by mouth once a week.  . [DISCONTINUED] prenatal vitamin w/FE, FA (PRENATAL 1 + 1) 27-1 MG TABS tablet Take 1 tablet by mouth daily at 12 noon. (Patient not taking: Reported on 04/18/2019)   No facility-administered encounter medications on file as of 08/12/2019.     Review of Systems  Constitutional: Positive for fatigue. Negative for chills and fever.  HENT:  Negative.   Eyes: Negative.   Respiratory: Negative.   Cardiovascular: Negative.   Gastrointestinal: Positive for abdominal distention and abdominal pain. Negative for blood in stool, constipation, diarrhea, nausea and vomiting.       C/o vague stomach pain pointing to RLQ. Bowel movements have been normal.   Endocrine: Negative.   Genitourinary: Negative.   Musculoskeletal: Negative.   Skin: Negative.      Vitals BP 112/70   Pulse (!) 58   Temp (!) 97.5 F (36.4 C)   Ht 5\' 1"  (1.549 m)   Wt 155 lb 12.8 oz (70.7 kg)   SpO2 98%   BMI 29.44 kg/m   Objective:   Physical Exam Constitutional:      Appearance: Normal appearance.  Cardiovascular:     Rate and Rhythm: Normal rate and regular rhythm.     Heart sounds: Normal heart sounds.  Pulmonary:     Effort: Pulmonary effort is normal.     Breath sounds: Normal breath sounds.  Abdominal:     General: Bowel sounds are normal.     Tenderness: There is abdominal tenderness. There is rebound.     Comments: Pain in RUQ and RLQ with rebound noted.  Musculoskeletal:        General: Normal range of motion.  Skin:    General: Skin is warm and dry.  Neurological:     Mental Status: She is alert and oriented to person, place, and time.  Psychiatric:        Behavior: Behavior normal.  Assessment and Plan   1. Gastroesophageal reflux disease without esophagitis  2. Right sided abdominal pain - POCT urine pregnancy - US Abdomen Complete  3. Other fatigue - POCT urine pregnancy   Presley presents today with one week history of not feeling good. She reports feeling bloated and points to RLQ pain. During abdominal exam she has RUQ and RLQ abdominal pain with rebound tenderness. Need to rule out pregnancy and will send for abdominal ultrasound to rule out appendicitis. Needs to be NPO for 8 hours (per ultrasound staff at [facility]) for u/s so it is being scheduled tomorrow.   Patient understands if anything changes: abdominal pain  changes or increased, she starts to run a fever, develops chills or rigors- she needs to immediately report to the Emergency department for a higher level of care.  Urine pregnancy negative in office today. Understands to report to [facility] for abdominal ultrasound tomorrow at 1015 (instructions given per nurse).   She understands and agrees to this plan. Reiterated to go to emergency room immediately if pain increases tonight.      Novella Olive, NP 08/12/2019

## 2019-08-12 NOTE — Patient Instructions (Addendum)
Gastroesophageal Reflux Disease, Adult Gastroesophageal reflux (GER) happens when acid from the stomach flows up into the tube that connects the mouth and the stomach (esophagus). Normally, food travels down the esophagus and stays in the stomach to be digested. With GER, food and stomach acid sometimes move back up into the esophagus. You may have a disease called gastroesophageal reflux disease (GERD) if the reflux:  Happens often.  Causes frequent or very bad symptoms.  Causes problems such as damage to the esophagus. When this happens, the esophagus becomes sore and swollen (inflamed). Over time, GERD can make small holes (ulcers) in the lining of the esophagus. What are the causes? This condition is caused by a problem with the muscle between the esophagus and the stomach. When this muscle is weak or not normal, it does not close properly to keep food and acid from coming back up from the stomach. The muscle can be weak because of:  Tobacco use.  Pregnancy.  Having a certain type of hernia (hiatal hernia).  Alcohol use.  Certain foods and drinks, such as coffee, chocolate, onions, and peppermint. What increases the risk? You are more likely to develop this condition if you:  Are overweight.  Have a disease that affects your connective tissue.  Use NSAID medicines. What are the signs or symptoms? Symptoms of this condition include:  Heartburn.  Difficult or painful swallowing.  The feeling of having a lump in the throat.  A bitter taste in the mouth.  Bad breath.  Having a lot of saliva.  Having an upset or bloated stomach.  Belching.  Chest pain. Different conditions can cause chest pain. Make sure you see your doctor if you have chest pain.  Shortness of breath or noisy breathing (wheezing).  Ongoing (chronic) cough or a cough at night.  Wearing away of the surface of teeth (tooth enamel).  Weight loss. How is this treated? Treatment will depend on how  bad your symptoms are. Your doctor may suggest:  Changes to your diet.  Medicine.  Surgery. Follow these instructions at home: Eating and drinking   Follow a diet as told by your doctor. You may need to avoid foods and drinks such as: ? Coffee and tea (with or without caffeine). ? Drinks that contain alcohol. ? Energy drinks and sports drinks. ? Bubbly (carbonated) drinks or sodas. ? Chocolate and cocoa. ? Peppermint and mint flavorings. ? Garlic and onions. ? Horseradish. ? Spicy and acidic foods. These include peppers, chili powder, curry powder, vinegar, hot sauces, and BBQ sauce. ? Citrus fruit juices and citrus fruits, such as oranges, lemons, and limes. ? Tomato-based foods. These include red sauce, chili, salsa, and pizza with red sauce. ? Fried and fatty foods. These include donuts, french fries, potato chips, and high-fat dressings. ? High-fat meats. These include hot dogs, rib eye steak, sausage, ham, and bacon. ? High-fat dairy items, such as whole milk, butter, and cream cheese.  Eat small meals often. Avoid eating large meals.  Avoid drinking large amounts of liquid with your meals.  Avoid eating meals during the 2-3 hours before bedtime.  Avoid lying down right after you eat.  Do not exercise right after you eat. Lifestyle   Do not use any products that contain nicotine or tobacco. These include cigarettes, e-cigarettes, and chewing tobacco. If you need help quitting, ask your doctor.  Try to lower your stress. If you need help doing this, ask your doctor.  If you are overweight, lose an amount   of weight that is healthy for you. Ask your doctor about a safe weight loss goal. General instructions  Pay attention to any changes in your symptoms.  Take over-the-counter and prescription medicines only as told by your doctor. Do not take aspirin, ibuprofen, or other NSAIDs unless your doctor says it is okay.  Wear loose clothes. Do not wear anything tight  around your waist.  Raise (elevate) the head of your bed about 6 inches (15 cm).  Avoid bending over if this makes your symptoms worse.  Keep all follow-up visits as told by your doctor. This is important. Contact a doctor if:  You have new symptoms.  You lose weight and you do not know why.  You have trouble swallowing or it hurts to swallow.  You have wheezing or a cough that keeps happening.  Your symptoms do not get better with treatment.  You have a hoarse voice. Get help right away if:  You have pain in your arms, neck, jaw, teeth, or back.  You feel sweaty, dizzy, or light-headed.  You have chest pain or shortness of breath.  You throw up (vomit) and your throw-up looks like blood or coffee grounds.  You pass out (faint).  Your poop (stool) is bloody or black.  You cannot swallow, drink, or eat. Summary  If a person has gastroesophageal reflux disease (GERD), food and stomach acid move back up into the esophagus and cause symptoms or problems such as damage to the esophagus.  Treatment will depend on how bad your symptoms are.  Follow a diet as told by your doctor.  Take all medicines only as told by your doctor. This information is not intended to replace advice given to you by your health care provider. Make sure you discuss any questions you have with your health care provider. Document Revised: 06/28/2017 Document Reviewed: 06/28/2017 Elsevier Patient Education  2020 [employer] Inc[employer]  Food Choices for Gastroesophageal Reflux Disease, Adult When you have gastroesophageal reflux disease (GERD), the foods you eat and your eating habits are very important. Choosing the right foods can help ease your discomfort. Think about working with a nutrition specialist (dietitian) to help you make good choices. What are tips for following this plan?  Meals  Choose healthy foods that are low in fat, such as fruits, vegetables, whole grains, low-fat dairy products, and  lean meat, fish, and poultry.  Eat small meals often instead of 3 large meals a day. Eat your meals slowly, and in a place where you are relaxed. Avoid bending over or lying down until 2-3 hours after eating.  Avoid eating meals 2-3 hours before bed.  Avoid drinking a lot of liquid with meals.  Cook foods using methods other than frying. Bake, grill, or broil food instead.  Avoid or limit: ? Chocolate. ? Peppermint or spearmint. ? Alcohol. ? Pepper. ? Black and decaffeinated coffee. ? Black and decaffeinated tea. ? Bubbly (carbonated) soft drinks. ? Caffeinated energy drinks and soft drinks.  Limit high-fat foods such as: ? Fatty meat or fried foods. ? Whole milk, cream, butter, or ice cream. ? Nuts and nut butters. ? Pastries, donuts, and sweets made with butter or shortening.  Avoid foods that cause symptoms. These foods may be different for everyone. Common foods that cause symptoms include: ? Tomatoes. ? Oranges, lemons, and limes. ? Peppers. ? Spicy food. ? Onions and garlic. ? Vinegar. Lifestyle  Maintain a healthy weight. Ask your doctor what weight is healthy for you. If you   to lose weight, work with your doctor to do so safely.  Exercise for at least 30 minutes for 5 or more days each week, or as told by your doctor.  Wear loose-fitting clothes.  Do not smoke. If you need help quitting, ask your doctor.  Sleep with the head of your bed higher than your feet. Use a wedge under the mattress or blocks under the bed frame to raise the head of the bed. Summary  When you have gastroesophageal reflux disease (GERD), food and lifestyle choices are very important in easing your symptoms.  Eat small meals often instead of 3 large meals a day. Eat your meals slowly, and in a place where you are relaxed.  Limit high-fat foods such as fatty meat or fried foods.  Avoid bending over or lying down until 2-3 hours after eating.  Avoid peppermint and spearmint,  caffeine, alcohol, and chocolate. This information is not intended to replace advice given to you by your health care provider. Make sure you discuss any questions you have with your health care provider. Document Revised: 04/12/2018 Document Reviewed: 01/26/2016 Elsevier Patient Education  2020 Else[employer] Inc.  Abdominal Pain, Adult Many things can cause belly (abdominal) pain. Most times, belly pain is not dangerous. Many cases of belly pain can be watched and treated at home. Sometimes, though, belly pain is serious. Your doctor will try to find the cause of your belly pain. Follow these instructions at home:  Medicines  Take over-the-counter and prescription medicines only as told by your doctor.  Do not take medicines that help you poop (laxatives) unless told by your doctor. General instructions  Watch your belly pain for any changes.  Drink enough fluid to keep your pee (urine) pale yellow.  Keep all follow-up visits as told by your doctor. This is important. Contact a doctor if:  Your belly pain changes or gets worse.  You are not hungry, or you lose weight without trying.  You are having trouble pooping (constipated) or have watery poop (diarrhea) for more than 2-3 days.  You have pain when you pee or poop.  Your belly pain wakes you up at night.  Your pain gets worse with meals, after eating, or with certain foods.  You are vomiting and cannot keep anything down.  You have a fever.  You have blood in your pee. Get help right away if:  Your pain does not go away as soon as your doctor says it should.  You cannot stop vomiting.  Your pain is only in areas of your belly, such as the right side or the left lower part of the belly.  You have bloody or black poop, or poop that looks like tar.  You have very bad pain, cramping, or bloating in your belly.  You have signs of not having enough fluid or water in your body (dehydration), such as: ? Dark pee, very  little pee, or no pee. ? Cracked lips. ? Dry mouth. ? Sunken eyes. ? Sleepiness. ? Weakness.  You have trouble breathing or chest pain. Summary  Many cases of belly pain can be watched and treated at home.  Watch your belly pain for any changes.  Take over-the-counter and prescription medicines only as told by your doctor.  Contact a doctor if your belly pain changes or gets worse.  Get help right away if you have very bad pain, cramping, or bloating in your belly. This information is not intended to replace advice given to you  by your health care provider. Make sure you discuss any questions you have with your health care provider. Document Revised: 04/30/2018 Document Reviewed: 04/30/2018 Elsevier Patient Education  2020 [employer].

## 2019-08-13 ENCOUNTER — Ambulatory Visit (HOSPITAL_COMMUNITY)
Admission: RE | Admit: 2019-08-13 | Discharge: 2019-08-13 | Disposition: A | Discharge status: Home / Self Care | Payer: BC Managed Care – PPO | Source: Ambulatory Visit | Attending: Family Medicine | Admitting: Family Medicine

## 2019-08-13 ENCOUNTER — Ambulatory Visit: Payer: BC Managed Care – PPO | Admitting: Family Medicine

## 2019-08-13 ENCOUNTER — Encounter: Payer: Self-pay | Admitting: Family Medicine

## 2019-08-13 DIAGNOSIS — R1031 Right lower quadrant pain: Secondary | ICD-10-CM | POA: Diagnosis not present

## 2019-08-13 DIAGNOSIS — R109 Unspecified abdominal pain: Secondary | ICD-10-CM | POA: Diagnosis not present

## 2019-08-13 DIAGNOSIS — Z9049 Acquired absence of other specified parts of digestive tract: Secondary | ICD-10-CM | POA: Diagnosis not present

## 2019-08-13 MED ORDER — IOHEXOL 9 MG/ML PO SOLN
ORAL | Status: AC
  Filled 2019-08-13: qty 1000

## 2019-08-13 MED ORDER — IOHEXOL 300 MG/ML  SOLN
100.0000 mL | Freq: Once | INTRAMUSCULAR | Status: AC | PRN
  Administered 2019-08-13: 100 mL via INTRAVENOUS

## 2019-08-13 MED ORDER — TRAMADOL HCL 50 MG PO TABS: 50.0000 mg | ORAL_TABLET | Freq: Three times a day (TID) | ORAL | 0 refills | Status: AC | PRN

## 2019-08-13 NOTE — Addendum Note (Signed)
Addended by: Novella Olive on: 08/13/2019 06:07 PM   Modules accepted: Orders

## 2019-08-13 NOTE — Progress Notes (Addendum)
   Patient ID: [name] Taylor, female    DOB: 01-Dec-1985, 34 y.o.   MRN: [id number]   Chief Complaint  Patient presents with  . Follow-up   Subjective:    HPI  pt arrives for a recheck. Had abdominal u/s this morning and returns for recheck. Denies fever, nausea, vomiting. Urine preg negative yesterday.    Medical History Ovida has a past medical history of Burning with urination (10/02/2013), Chronic headaches, Chronic leg pain, GERD (gastroesophageal reflux disease), Hematuria (10/02/2013), LLQ pain (10/02/2013), Marijuana use, and Vaginal discharge (10/02/2013).   Outpatient Encounter Medications as of 08/13/2019  Medication Sig  . mometasone (ELOCON) 0.1 % cream Use BID on affected area prn. Do NOT use on face.  . mupirocin ointment (BACTROBAN) 2 % Apply BID to affected area.  . pantoprazole (PROTONIX) 40 MG tablet Take 1 tablet (40 mg total) by mouth daily.  Marland Kitchen triamcinolone cream (KENALOG) 0.1 % Apply 1 application topically 2 (two) times daily.   No facility-administered encounter medications on file as of 08/13/2019.     Review of Systems  Gastrointestinal: Positive for abdominal pain. Negative for nausea and vomiting.       "bloated" and points to pain      Vitals There were no vitals taken for this visit.  Objective:   Physical Exam Abdominal:     General: Bowel sounds are normal.     Palpations: Abdomen is soft.     Tenderness: There is abdominal tenderness in the right upper quadrant, right lower quadrant, epigastric area and suprapubic area.       Comments: Tenderness/pain noted in epigastric, RUQ, RLQ, and suprapubic area.      Assessment and Plan   1. Abdominal pain, unspecified abdominal location - CT Abdomen Pelvis W Contrast   Follow-up from office visit yesterday. Continues to have significant tenderness and pain. Needs stat CT scan to rule-out appendicitis.  Will call patient this evening with results and appropriate follow-up will be addressed at  that time.  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   Patient understands and agrees with plan.   CT scan results received and called to patient. No action is required for tonight. Will order Tramadol for pain. Patient understands results and questions answered.If she continues to have pain after one week, will refer to GYN.   Dorena Bodo, NP   08/13/2019

## 2019-08-14 ENCOUNTER — Other Ambulatory Visit: Payer: Self-pay | Admitting: Family Medicine

## 2019-08-14 ENCOUNTER — Encounter: Payer: Self-pay | Admitting: Family Medicine

## 2019-08-21 ENCOUNTER — Telehealth: Payer: Self-pay | Admitting: Family Medicine

## 2019-08-21 DIAGNOSIS — Z029 Encounter for administrative examinations, unspecified: Secondary | ICD-10-CM

## 2019-08-21 NOTE — Telephone Encounter (Signed)
Patient brought in FMLA to be completed in your folder °

## 2019-08-23 NOTE — Telephone Encounter (Signed)
FMLA was filled out °

## 2019-10-11 ENCOUNTER — Ambulatory Visit (INDEPENDENT_AMBULATORY_CARE_PROVIDER_SITE_OTHER): Payer: BC Managed Care – PPO | Admitting: Family Medicine

## 2019-10-11 ENCOUNTER — Other Ambulatory Visit: Payer: Self-pay

## 2019-10-11 DIAGNOSIS — R059 Cough, unspecified: Secondary | ICD-10-CM

## 2019-10-11 DIAGNOSIS — J019 Acute sinusitis, unspecified: Secondary | ICD-10-CM | POA: Diagnosis not present

## 2019-10-11 MED ORDER — AZITHROMYCIN 250 MG PO TABS: ORAL_TABLET | ORAL | 0 refills | Status: DC

## 2019-10-11 MED ORDER — ALBUTEROL SULFATE HFA 108 (90 BASE) MCG/ACT IN AERS
2.0000 | INHALATION_SPRAY | Freq: Four times a day (QID) | RESPIRATORY_TRACT | 2 refills | Status: DC | PRN
Start: 2019-10-11 — End: 2020-06-18

## 2019-10-11 MED ORDER — PREDNISONE 20 MG PO TABS: ORAL_TABLET | ORAL | 0 refills | Status: DC

## 2019-10-11 NOTE — Progress Notes (Signed)
   Subjective:    Patient ID: [name] Taylor, female    DOB: [birth year], 34 y.o.   MRN: [id number]  HPI Pt having chest congestion, rash on right arm that is going to chest area and cough. Going on about one week.  Patient relates she is having a lot of head congestion drainage coughing in addition to this has a rash on her chest some on her arm and finds herself feeling fatigued tired rundown she states her son had a recent cold no other particular troubles.  PMH benign  Review of Systems See above denies shortness of breath    Objective:   Physical Exam  Lungs clear respiratory rate normal coarse cough noted HEENT benign erythematous rash on the chest and on the right arm      Assessment & Plan:  I believe the viral syndrome as well as a rash is consistent with Covid I recommend the patient stay self isolated Covid test taken Follow-up if progressive troubles or problems Await the test results Stay home from work until test results are known warning signs were discussed.

## 2019-10-13 LAB — SARS-COV-2, NAA 2 DAY TAT

## 2019-10-13 LAB — NOVEL CORONAVIRUS, NAA: SARS-CoV-2, NAA: NOT DETECTED

## 2019-10-15 ENCOUNTER — Telehealth: Payer: Self-pay

## 2019-10-15 NOTE — Telephone Encounter (Signed)
Patient had short term disability forms faxed over to be completed in your folder to be completed.

## 2019-10-16 NOTE — Telephone Encounter (Signed)
Unfortunately my superpowers are limited. I have lost the ability to know the answers without some input for these type of forms. Therefore the following Patient will need to state what was her first date out? When is the patient intending to go back to work? I assume that this leave of absence was related to her recent sickness? Without these answers I am unable to fill out the form therefore for now I am holding onto the form until these answers are completed by the front

## 2019-10-18 ENCOUNTER — Encounter: Payer: Self-pay | Admitting: Family Medicine

## 2019-10-18 NOTE — Telephone Encounter (Signed)
Patient would like extended work note from 10/8-10/16 returning to work on 10/15

## 2019-10-22 ENCOUNTER — Telehealth: Payer: Self-pay

## 2019-10-22 NOTE — Telephone Encounter (Signed)
Form was completed 

## 2019-10-22 NOTE — Telephone Encounter (Signed)
Form was completed thank you for filling this part out

## 2019-10-22 NOTE — Telephone Encounter (Signed)
Patient had disability form faxed over to be completed I fill in what I could ,please review and fill in highlighted areas.In your folder to complete

## 2019-12-03 ENCOUNTER — Other Ambulatory Visit: Payer: Self-pay | Admitting: Family Medicine

## 2019-12-10 ENCOUNTER — Ambulatory Visit: Payer: Self-pay

## 2020-01-31 ENCOUNTER — Other Ambulatory Visit: Payer: Self-pay | Admitting: Family Medicine

## 2020-02-10 ENCOUNTER — Ambulatory Visit: Payer: BC Managed Care – PPO | Admitting: Family Medicine

## 2020-02-10 ENCOUNTER — Encounter: Payer: Self-pay | Admitting: Family Medicine

## 2020-02-10 ENCOUNTER — Other Ambulatory Visit: Payer: Self-pay

## 2020-02-10 VITALS — BP 122/78 | HR 82 | Temp 97.0°F | Ht 61.0 in | Wt 167.8 lb

## 2020-02-10 DIAGNOSIS — L259 Unspecified contact dermatitis, unspecified cause: Secondary | ICD-10-CM | POA: Diagnosis not present

## 2020-02-10 MED ORDER — TRIAMCINOLONE ACETONIDE 0.1 % EX CREA: 1.0000 "application " | TOPICAL_CREAM | Freq: Two times a day (BID) | CUTANEOUS | 1 refills | Status: DC

## 2020-02-10 MED ORDER — CETIRIZINE HCL 10 MG PO TABS: 10.0000 mg | ORAL_TABLET | Freq: Every day | ORAL | 1 refills | Status: DC
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
## 2020-02-10 NOTE — Patient Instructions (Signed)
Contact Dermatitis Dermatitis is redness, soreness, and swelling (inflammation) of the skin. Contact dermatitis is a reaction to something that touches the skin. There are two types of contact dermatitis:  Irritant contact dermatitis. This happens when something bothers (irritates) your skin, like soap.  Allergic contact dermatitis. This is caused when you are exposed to something that you are allergic to, such as poison ivy. What are the causes?  Common causes of irritant contact dermatitis include: ? Makeup. ? Soaps. ? Detergents. ? Bleaches. ? Acids. ? Metals, such as nickel.  Common causes of allergic contact dermatitis include: ? Plants. ? Chemicals. ? Jewelry. ? Latex. ? Medicines. ? Preservatives in products, such as clothing. What increases the risk?  Having a job that exposes you to things that bother your skin.  Having asthma or eczema. What are the signs or symptoms? Symptoms may happen anywhere the irritant has touched your skin. Symptoms include:  Dry or flaky skin.  Redness.  Cracks.  Itching.  Pain or a burning feeling.  Blisters.  Blood or clear fluid draining from skin cracks. With allergic contact dermatitis, swelling may occur. This may happen in places such as the eyelids, mouth, or genitals.   How is this treated?  This condition is treated by checking for the cause of the reaction and protecting your skin. Treatment may also include: ? Steroid creams, ointments, or medicines. ? Antibiotic medicines or other ointments, if you have a skin infection. ? Lotion or medicines to help with itching. ? A bandage (dressing). Follow these instructions at home: Skin care  Moisturize your skin as needed.  Put cool cloths on your skin.  Put a baking soda paste on your skin. Stir water into baking soda until it looks like a paste.  Do not scratch your skin.  Avoid having things rub up against your skin.  Avoid the use of soaps, perfumes, and  dyes. Medicines  Take or apply over-the-counter and prescription medicines only as told by your doctor.  If you were prescribed an antibiotic medicine, take or apply it as told by your doctor. Do not stop using it even if your condition starts to get better. Bathing  Take a bath with: ? Epsom salts. ? Baking soda. ? Colloidal oatmeal.  Bathe less often.  Bathe in warm water. Avoid using hot water. Bandage care  If you were given a bandage, change it as told by your health care provider.  Wash your hands with soap and water before and after you change your bandage. If soap and water are not available, use hand sanitizer. General instructions  Avoid the things that caused your reaction. If you do not know what caused it, keep a journal. Write down: ? What you eat. ? What skin products you use. ? What you drink. ? What you wear in the area that has symptoms. This includes jewelry.  Check the affected areas every day for signs of infection. Check for: ? More redness, swelling, or pain. ? More fluid or blood. ? Warmth. ? Pus or a bad smell.  Keep all follow-up visits as told by your doctor. This is important. Contact a doctor if:  You do not get better with treatment.  Your condition gets worse.  You have signs of infection, such as: ? More swelling. ? Tenderness. ? More redness. ? Soreness. ? Warmth.  You have a fever.  You have new symptoms. Get help right away if:  You have a very bad headache.  You have   neck pain.  Your neck is stiff.  You throw up (vomit).  You feel very sleepy.  You see red streaks coming from the area.  Your bone or joint near the area hurts after the skin has healed.  The area turns darker.  You have trouble breathing. Summary  Dermatitis is redness, soreness, and swelling of the skin.  Symptoms may occur where the irritant has touched you.  Treatment may include medicines and skin care.  If you do not know what caused  your reaction, keep a journal.  Contact a doctor if your condition gets worse or you have signs of infection. This information is not intended to replace advice given to you by your health care provider. Make sure you discuss any questions you have with your health care provider. Document Revised: 04/11/2018 Document Reviewed: 07/05/2017 Elsevier Patient Education  2021 Elsevier Inc.  

## 2020-03-09 ENCOUNTER — Encounter: Payer: BC Managed Care – PPO | Admitting: Family Medicine

## 2020-03-09 ENCOUNTER — Encounter: Payer: Self-pay | Admitting: Family Medicine

## 2020-03-18 ENCOUNTER — Ambulatory Visit
Admission: RE | Admit: 2020-03-18 | Discharge: 2020-03-18 | Disposition: A | Discharge status: Home / Self Care | Payer: BC Managed Care – PPO | Source: Ambulatory Visit | Attending: Family Medicine | Admitting: Family Medicine

## 2020-03-18 ENCOUNTER — Other Ambulatory Visit: Payer: Self-pay

## 2020-03-18 VITALS — BP 127/77 | HR 72 | Temp 98.3°F | Resp 18

## 2020-03-18 DIAGNOSIS — Z20822 Contact with and (suspected) exposure to covid-19: Secondary | ICD-10-CM | POA: Diagnosis not present

## 2020-03-18 DIAGNOSIS — H9203 Otalgia, bilateral: Secondary | ICD-10-CM

## 2020-03-18 DIAGNOSIS — L853 Xerosis cutis: Secondary | ICD-10-CM

## 2020-03-18 DIAGNOSIS — J029 Acute pharyngitis, unspecified: Secondary | ICD-10-CM | POA: Diagnosis not present

## 2020-03-18 NOTE — ED Triage Notes (Signed)
Bilateral ear pain and sore throat and diarrhea since Monday.

## 2020-03-18 NOTE — ED Triage Notes (Signed)
Also states she wants her left thumb looked at.  States she skin is really dry and has a cut on it.
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
## 2020-04-21 ENCOUNTER — Other Ambulatory Visit: Payer: Self-pay | Admitting: Family Medicine

## 2020-05-11 ENCOUNTER — Ambulatory Visit: Payer: BC Managed Care – PPO | Admitting: Family Medicine

## 2020-05-11 ENCOUNTER — Encounter: Payer: Self-pay | Admitting: Family Medicine

## 2020-05-20 ENCOUNTER — Other Ambulatory Visit: Payer: Self-pay

## 2020-05-20 ENCOUNTER — Ambulatory Visit
Admission: RE | Admit: 2020-05-20 | Discharge: 2020-05-20 | Disposition: A | Discharge status: Home / Self Care | Payer: BC Managed Care – PPO | Source: Ambulatory Visit | Attending: Family Medicine | Admitting: Family Medicine

## 2020-05-20 VITALS — BP 116/68 | HR 60 | Temp 99.0°F | Resp 17

## 2020-05-20 DIAGNOSIS — A084 Viral intestinal infection, unspecified: Secondary | ICD-10-CM

## 2020-05-20 DIAGNOSIS — J069 Acute upper respiratory infection, unspecified: Secondary | ICD-10-CM

## 2020-05-20 MED ORDER — ALBUTEROL SULFATE HFA 108 (90 BASE) MCG/ACT IN AERS
2.0000 | INHALATION_SPRAY | Freq: Once | RESPIRATORY_TRACT | Status: AC
  Administered 2020-05-20: 2 via RESPIRATORY_TRACT

## 2020-05-20 MED ORDER — ONDANSETRON HCL 4 MG PO TABS: 4.0000 mg | ORAL_TABLET | Freq: Four times a day (QID) | ORAL | 0 refills | Status: DC

## 2020-05-20 MED ORDER — BENZONATATE 100 MG PO CAPS: 100.0000 mg | ORAL_CAPSULE | Freq: Three times a day (TID) | ORAL | 0 refills | Status: DC

## 2020-05-20 MED ORDER — PREDNISONE 10 MG (21) PO TBPK: ORAL_TABLET | Freq: Every day | ORAL | 0 refills | Status: AC

## 2020-05-20 NOTE — Discharge Instructions (Addendum)
I have sent in a prednisone taper for you to take for 6 days. 6 tablets on day one, 5 tablets on day two, 4 tablets on day three, 3 tablets on day four, 2 tablets on day five, and 1 tablet on day six.  I have sent in tessalon perles for you to use one capsule every 8 hours as needed for cough.  I have sent in Zofran for you to take one tablet every 8 hours as needed for nausea.  Your COVID and Influenza tests are pending.  You should self quarantine until the test results are back.    Take Tylenol or ibuprofen as needed for fever or discomfort.  Rest and keep yourself hydrated.    Follow-up with your primary care provider if your symptoms are not improving.

## 2020-05-20 NOTE — ED Triage Notes (Signed)
RT ear and throat pain that started yesterday.  Also reports indigestion and she vomited x 1 yesterday and x 1 this morning.
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
## 2020-05-21 LAB — COVID-19, FLU A+B NAA
Influenza A, NAA: NOT DETECTED
Influenza B, NAA: NOT DETECTED
SARS-CoV-2, NAA: NOT DETECTED

## 2020-06-18 ENCOUNTER — Other Ambulatory Visit: Payer: Self-pay | Admitting: Family Medicine

## 2020-07-28 ENCOUNTER — Telehealth: Payer: BC Managed Care – PPO | Admitting: Physician Assistant

## 2020-07-28 DIAGNOSIS — R519 Headache, unspecified: Secondary | ICD-10-CM | POA: Diagnosis not present

## 2020-07-28 MED ORDER — ONDANSETRON 4 MG PO TBDP: 4.0000 mg | ORAL_TABLET | Freq: Three times a day (TID) | ORAL | 0 refills | Status: DC | PRN

## 2020-07-28 MED ORDER — SUMATRIPTAN SUCCINATE 25 MG PO TABS: 25.0000 mg | ORAL_TABLET | Freq: Once | ORAL | 0 refills | Status: DC

## 2020-07-28 NOTE — Progress Notes (Signed)
Virtual Visit Consent   SHELIAH Taylor, you are scheduled for a virtual visit with a [facility] provider today.     Just as with appointments in the office, your consent must be obtained to participate.  Your consent will be active for this visit and any virtual visit you may have with one of our providers in the next 365 days.     If you have a MyChart account, a copy of this consent can be sent to you electronically.  All virtual visits are billed to your insurance company just like a traditional visit in the office.    As this is a virtual visit, video technology does not allow for your provider to perform a traditional examination.  This may limit your provider's ability to fully assess your condition.  If your provider identifies any concerns that need to be evaluated in person or the need to arrange testing (such as labs, EKG, etc.), we will make arrangements to do so.     Although advances in technology are sophisticated, we cannot ensure that it will always work on either your end or our end.  If the connection with a video visit is poor, the visit may have to be switched to a telephone visit.  With either a video or telephone visit, we are not always able to ensure that we have a secure connection.     I need to obtain your verbal consent now.   Are you willing to proceed with your visit today?    [name] Taylor has provided verbal consent on 07/28/2020 for a virtual visit (video or telephone).   [name], [place]   Date: 07/28/2020 12:39 PM   Virtual Visit via Video Note   I, [name], connected with  [name] Taylor  ([id number], [birth year]) on 07/28/20 at 12:30 PM EDT by a video-enabled telemedicine application and verified that I am speaking with the correct person using two identifiers.  Location: Patient: Virtual Visit Location Patient: Home Provider: Virtual Visit Location Provider: Home Office   I discussed the limitations of evaluation and management by  telemedicine and the availability of in person appointments. The patient expressed understanding and agreed to proceed.    History of Present Illness: [name] Taylor is a 35 y.o. who identifies as a female who was assigned female at birth, and is being seen today for headache. Patient endorses left-sided frontal headaches starting 2 days ago and occurring daily. Endorses pain is a sensation like sinus pressure but she has not had any URI symptoms. Notes associated nausea and photophobia/phonophobia. Denies fever, chills, vision changes. Denies head trauma or injury. Denies AMS. She denies any recent change to diet/exercise or stress. Has noted a very ongoing and gradual decrease in far vision, has not seen an eye doctor in some time. Is wondering if this could be a potential trigger. Notes that she had this issue before in the past but was some time ago and seemed to resolve on its own. Is taking Tylenol and Ibuprofen for headache with only some improvement in symptoms.   HPI: HPI  Problems:  Patient Active Problem List   Diagnosis Date Noted   Contact dermatitis 02/10/2020   Gastroesophageal reflux disease without esophagitis 08/12/2019   Other fatigue 08/12/2019   Chronic cholecystitis    Intractable nausea and vomiting 08/22/2017   Tobacco abuse 08/22/2017   Right sided abdominal pain    Burning with urination 10/02/2013   Hematuria 10/02/2013  LLQ pain 10/02/2013   Vaginal discharge 10/02/2013    Allergies: No Known Allergies Medications:  Current Outpatient Medications:    pantoprazole (PROTONIX) 40 MG tablet, TAKE 1 TABLET(40 MG) BY MOUTH DAILY, Disp: 90 tablet, Rfl: 0   albuterol (VENTOLIN HFA) 108 (90 Base) MCG/ACT inhaler, INHALE 2 PUFFS INTO THE LUNGS EVERY 6 HOURS AS NEEDED FOR WHEEZING, Disp: 6.7 g, Rfl: 3   ondansetron (ZOFRAN) 4 MG tablet, Take 1 tablet (4 mg total) by mouth every 6 (six) hours., Disp: 12 tablet, Rfl: 0   triamcinolone (KENALOG) 0.1 %, Apply 1 application  topically 2 (two) times daily., Disp: 30 g, Rfl: 1  Observations/Objective: Patient is well-developed, well-nourished in no acute distress.  Resting comfortably at home.  Head is normocephalic, atraumatic.  No labored breathing. Speech is clear and coherent with logical content.  Patient is alert and oriented at baseline.  EOMI. Conjunctiva within normal limits. Normal shoulder shrug. Equal movement of facial muscles. Able to protract tongue.  No subjective sensation decrease.  Assessment and Plan: 1. Daily headache X 2.5 days. Migrainous in nature. No alarm signs/symptoms noted. Increase fluids. Avoid skipping meals. Recommend appointment with eye doctor as gradual vision changes can cause strain on eyes and trigger headaches. Will give Imitrex for abortive therapy. Refill zofran. Follow-up with PCP discussed. Strict UC/ER precautions reviewed with patient who voiced understanding and agreement with plan.   Follow Up Instructions: I discussed the assessment and treatment plan with the patient. The patient was provided an opportunity to ask questions and all were answered. The patient agreed with the plan and demonstrated an understanding of the instructions.  A copy of instructions were sent to the patient via MyChart.  The patient was advised to call back or seek an in-person evaluation if the symptoms worsen or if the condition fails to improve as anticipated.  Time:  I spent 15 minutes with the patient via telehealth technology discussing the above problems/concerns.    [name], PA-C

## 2020-08-19 ENCOUNTER — Other Ambulatory Visit: Payer: Self-pay | Admitting: Family Medicine

## 2020-08-21 NOTE — Telephone Encounter (Signed)
Patient schedule medication follow up on 9/2

## 2020-08-28 ENCOUNTER — Other Ambulatory Visit (INDEPENDENT_AMBULATORY_CARE_PROVIDER_SITE_OTHER): Payer: BC Managed Care – PPO

## 2020-08-28 ENCOUNTER — Other Ambulatory Visit: Payer: Self-pay

## 2020-08-28 VITALS — BP 127/86 | HR 80 | Ht 61.0 in | Wt 165.5 lb

## 2020-08-28 DIAGNOSIS — Z3200 Encounter for pregnancy test, result unknown: Secondary | ICD-10-CM | POA: Diagnosis not present

## 2020-08-28 LAB — POCT URINE PREGNANCY: Preg Test, Ur: POSITIVE — AB

## 2020-08-28 NOTE — Progress Notes (Signed)
Chart reviewed for nurse visit. Agree with plan of care.  Adline Potter, NP 08/28/2020 1:34 PM

## 2020-08-28 NOTE — Progress Notes (Signed)
   NURSE VISIT- PREGNANCY CONFIRMATION   SUBJECTIVE:  [name] Taylor is a 35 y.o. [phone] female at [redacted]w[redacted]d by certain LMP of Patient's last menstrual period was 07/17/2020 (approximate). Here for pregnancy confirmation.  Home pregnancy test: positive x 2   She reports cramping.  She is not taking prenatal vitamins.    OBJECTIVE:  BP 127/86 (BP Location: Right Arm, Patient Position: Sitting, Cuff Size: Normal)   Taylor 80   Ht 5\' 1"  (1.549 m)   Wt 165 lb 8 oz (75.1 kg)   LMP 07/17/2020 (Approximate)   BMI 31.27 kg/m   Appears well, in no apparent distress  Results for orders placed or performed in visit on 08/28/20 (from the past 24 hour(s))  POCT urine pregnancy   Collection Time: 08/28/20 11:21 AM  Result Value Ref Range   Preg Test, Ur Positive (A) Negative    ASSESSMENT: Positive pregnancy test, [redacted]w[redacted]d by LMP    PLAN: Schedule for dating ultrasound in 3 weeks Prenatal vitamins: plans to begin OTC ASAP   Nausea medicines: not currently needed   OB packet given: Yes  [name] Taylor A [name] Taylor  08/28/2020 11:24 AM

## 2020-08-29 LAB — BETA HCG QUANT (REF LAB): hCG Quant: 77 m[IU]/mL

## 2020-09-01 ENCOUNTER — Telehealth: Payer: Self-pay | Admitting: Women's Health

## 2020-09-01 NOTE — Telephone Encounter (Signed)
Pt is having constipation since starting her prenatal vitamins Is this normal?  What can she do to help it?  Please advise & notify pt

## 2020-09-01 NOTE — Telephone Encounter (Signed)
Mychart msg sent regarding pt's question

## 2020-09-02 ENCOUNTER — Telehealth: Payer: Self-pay

## 2020-09-02 NOTE — Telephone Encounter (Signed)
Pt called stating that she had started spotting yesterday (8/30), but only noticed it when she wiped. It was light pink in color. Today, the spotting was slightly increased and darker in color. She admits to having sex just before the spotting yesterday began. She denies any cramping. She states that she does have some light burning after urination. Pt educated on sex or straining with bowel movements possibly causing the bleeding. Pt was instructed to inform us if the bleeding worsened or she began cramping. Pt confirmed understanding.

## 2020-09-04 ENCOUNTER — Ambulatory Visit: Payer: BC Managed Care – PPO | Admitting: Family Medicine

## 2020-09-07 ENCOUNTER — Emergency Department (HOSPITAL_COMMUNITY)
Admission: EM | Admit: 2020-09-07 | Discharge: 2020-09-07 | Disposition: A | Discharge status: Home / Self Care | Payer: BC Managed Care – PPO | Attending: Student | Admitting: Student

## 2020-09-07 ENCOUNTER — Other Ambulatory Visit: Payer: Self-pay

## 2020-09-07 ENCOUNTER — Encounter (HOSPITAL_COMMUNITY): Payer: Self-pay

## 2020-09-07 DIAGNOSIS — O039 Complete or unspecified spontaneous abortion without complication: Secondary | ICD-10-CM | POA: Insufficient documentation

## 2020-09-07 DIAGNOSIS — R1033 Periumbilical pain: Secondary | ICD-10-CM | POA: Insufficient documentation

## 2020-09-07 DIAGNOSIS — N9489 Other specified conditions associated with female genital organs and menstrual cycle: Secondary | ICD-10-CM | POA: Insufficient documentation

## 2020-09-07 DIAGNOSIS — F17210 Nicotine dependence, cigarettes, uncomplicated: Secondary | ICD-10-CM | POA: Insufficient documentation

## 2020-09-07 DIAGNOSIS — O99330 Smoking (tobacco) complicating pregnancy, unspecified trimester: Secondary | ICD-10-CM | POA: Insufficient documentation

## 2020-09-07 DIAGNOSIS — O99891 Other specified diseases and conditions complicating pregnancy: Secondary | ICD-10-CM | POA: Insufficient documentation

## 2020-09-07 LAB — URINALYSIS, ROUTINE W REFLEX MICROSCOPIC
Bilirubin Urine: NEGATIVE
Glucose, UA: NEGATIVE mg/dL
Ketones, ur: NEGATIVE mg/dL
Nitrite: NEGATIVE
Protein, ur: NEGATIVE mg/dL
Specific Gravity, Urine: <1.005 — ABNORMAL LOW (ref 1.005–1.030)
pH: 5 (ref 5.0–8.0)

## 2020-09-07 LAB — HCG, QUANTITATIVE, PREGNANCY: hCG, Beta Chain, Quant, S: 10 m[IU]/mL — ABNORMAL HIGH (ref <5)

## 2020-09-07 LAB — URINALYSIS, MICROSCOPIC (REFLEX): RBC / HPF: >50 RBC/hpf (ref 0–5)

## 2020-09-07 LAB — POC URINE PREG, ED: Preg Test, Ur: NEGATIVE

## 2020-09-07 NOTE — ED Triage Notes (Signed)
Pt arrived in POV c/o vaginal bleeding x 1 week and pregnant about 5-6 weeks. Blood is noticed now and then. Light pink and sometimes darker color blood. Left flank pain slight since found out she was pregnant.
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
## 2020-09-07 NOTE — Discharge Instructions (Addendum)
Please follow up with established OBGYN in two days for a bHCG check.   If you have any new or worsening symptoms please return to the ED.

## 2020-09-16 ENCOUNTER — Encounter: Payer: Self-pay | Admitting: Emergency Medicine

## 2020-09-16 ENCOUNTER — Ambulatory Visit
Admission: EM | Admit: 2020-09-16 | Discharge: 2020-09-16 | Disposition: A | Discharge status: Home / Self Care | Payer: Self-pay | Attending: Family Medicine | Admitting: Family Medicine

## 2020-09-16 ENCOUNTER — Other Ambulatory Visit: Payer: Self-pay

## 2020-09-16 DIAGNOSIS — Z20822 Contact with and (suspected) exposure to covid-19: Secondary | ICD-10-CM | POA: Diagnosis not present
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
## 2020-09-16 NOTE — Discharge Instructions (Addendum)
You have been tested for COVID-19 today. °If your test returns positive, you will receive a phone call from [facility] regarding your results. °Negative test results are not called. °Both positive and negative results area always visible on MyChart. °If you do not have a MyChart account, sign up instructions are provided in your discharge papers. °Please do not hesitate to contact us should you have questions or concerns. ° °

## 2020-09-16 NOTE — ED Triage Notes (Signed)
Had a positive home covid test today.  States she does have some chest congestion and feels tight in her chest.

## 2020-09-17 LAB — NOVEL CORONAVIRUS, NAA: SARS-CoV-2, NAA: NOT DETECTED

## 2020-09-17 LAB — SARS-COV-2, NAA 2 DAY TAT

## 2020-09-22 ENCOUNTER — Other Ambulatory Visit: Payer: Self-pay | Admitting: Obstetrics & Gynecology

## 2020-09-22 DIAGNOSIS — O3680X0 Pregnancy with inconclusive fetal viability, not applicable or unspecified: Secondary | ICD-10-CM

## 2020-09-23 ENCOUNTER — Other Ambulatory Visit: Payer: Self-pay

## 2020-09-23 ENCOUNTER — Ambulatory Visit: Payer: Self-pay

## 2020-09-23 DIAGNOSIS — O039 Complete or unspecified spontaneous abortion without complication: Secondary | ICD-10-CM

## 2020-09-23 DIAGNOSIS — O209 Hemorrhage in early pregnancy, unspecified: Secondary | ICD-10-CM

## 2020-09-24 LAB — BETA HCG QUANT (REF LAB): hCG Quant: <1 m[IU]/mL

## 2020-09-24 LAB — ABO/RH: Rh Factor: POSITIVE

## 2020-12-09 IMAGING — CT CT ABD-PELV W/ CM
2 of 4 series · 16 of 46 positions shown, 18 images · IV contrast (omnipaque)
Comparison: None.

CLINICAL DATA: Right lower quadrant pain

EXAM:
CT ABDOMEN AND PELVIS WITH CONTRAST
TECHNIQUE: Multidetector CT imaging of the abdomen and pelvis was performed
using the standard protocol following bolus administration of
intravenous contrast.
CONTRAST:  100mL OMNIPAQUE IOHEXOL 300 MG/ML  SOLN

[Series 2: axial st · axial · 0.64mm/px · z∈[+1156,+1586]mm · 13 of 96 slices shown, 15 images]
[im 5/96  soft-tissue]
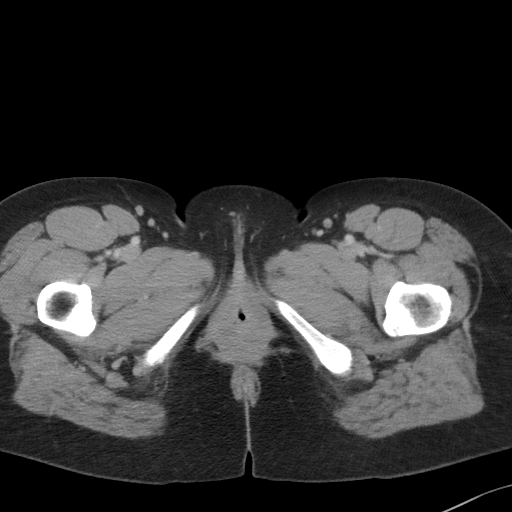
[im 5/96  bone]
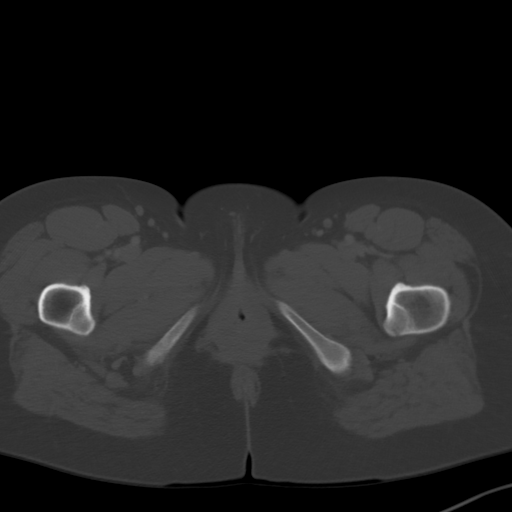
[im 13/96  soft-tissue]
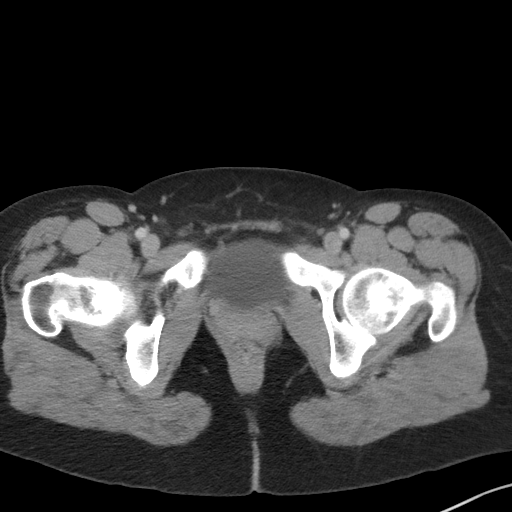
[im 21/96  soft-tissue]
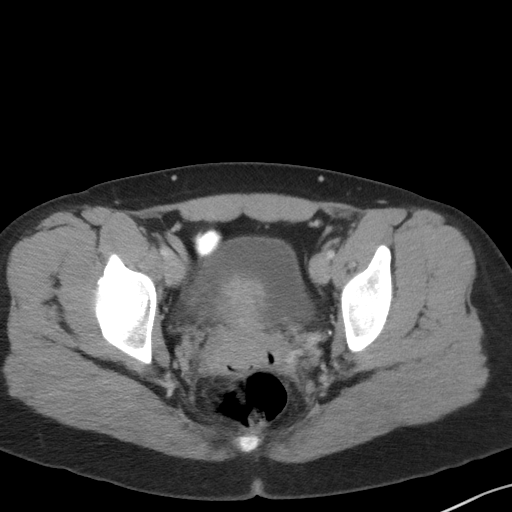
[im 25/96  soft-tissue]
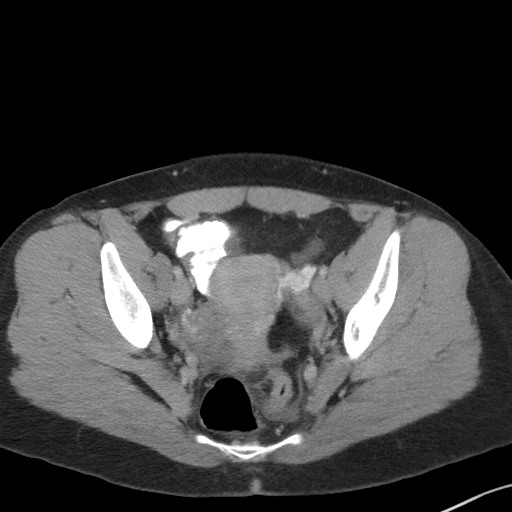
[im 34/96  soft-tissue]
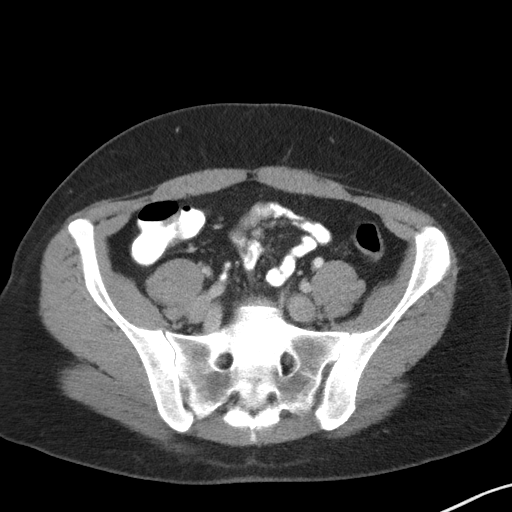
[im 42/96  soft-tissue]
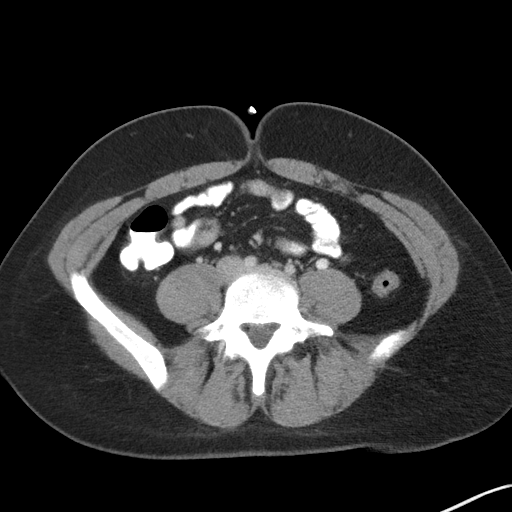
[im 50/96  soft-tissue]
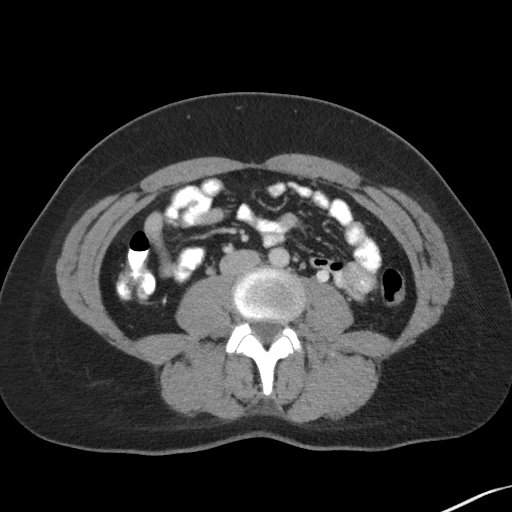
[im 54/96  soft-tissue]
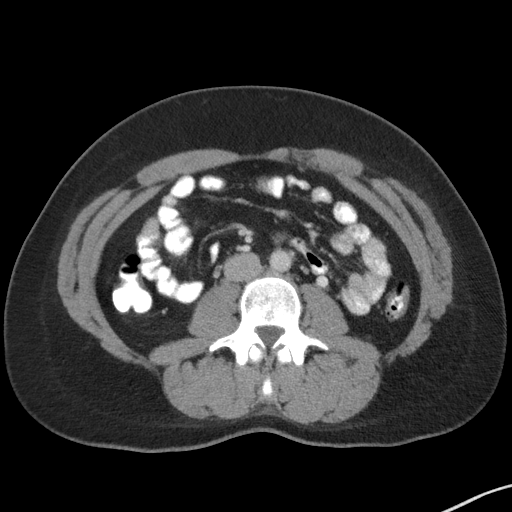
[im 62/96  soft-tissue]
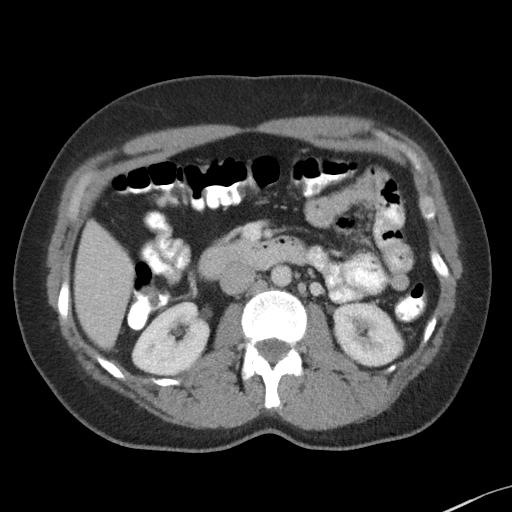
[im 62/96  bone]
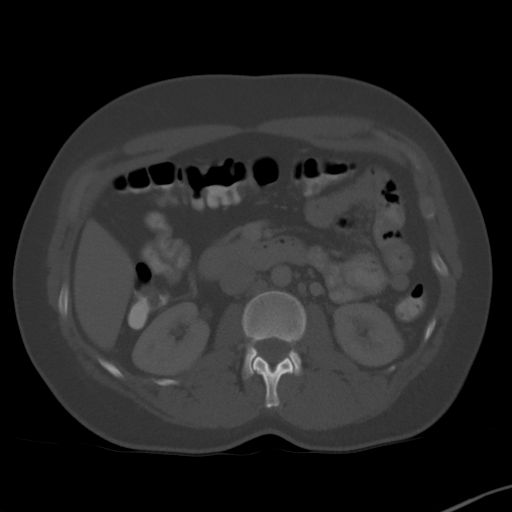
[im 71/96  soft-tissue]
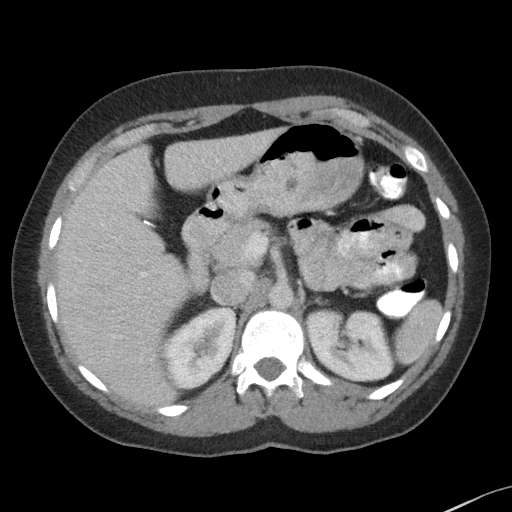
[im 75/96  soft-tissue]
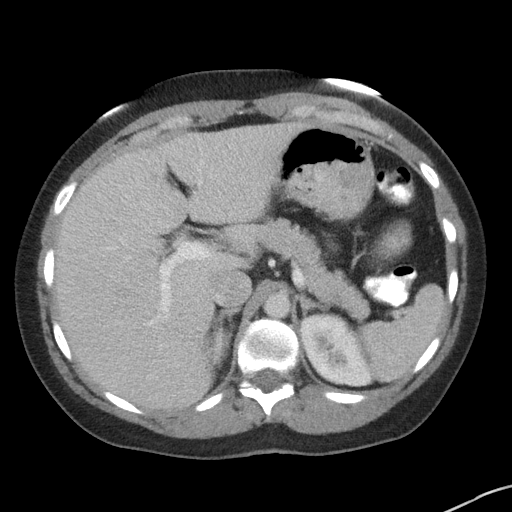
[im 83/96  soft-tissue]
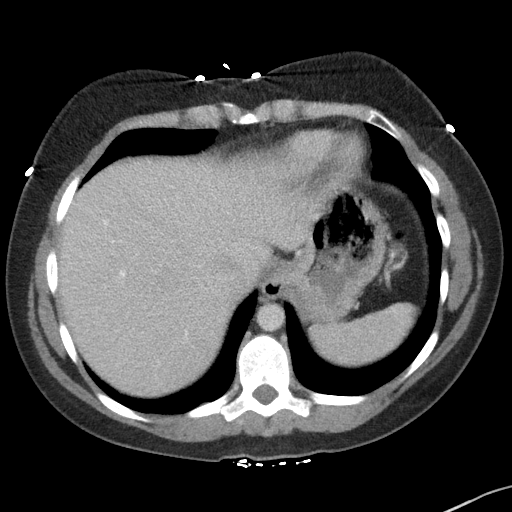
[im 91/96  soft-tissue]
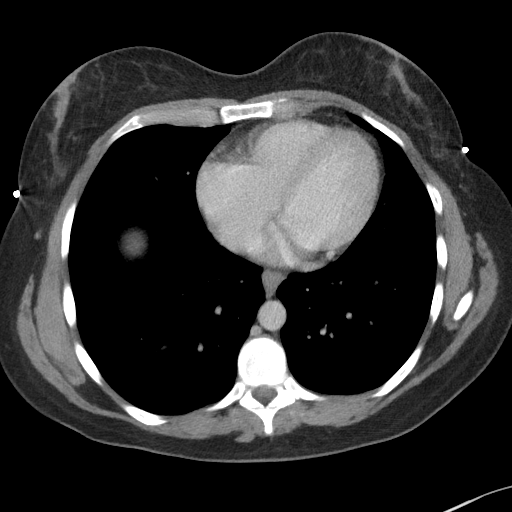

[Series 5: coronal st · coronal · 0.75mm/px · 3 of 116 slices shown]
[im 39/116  soft-tissue]
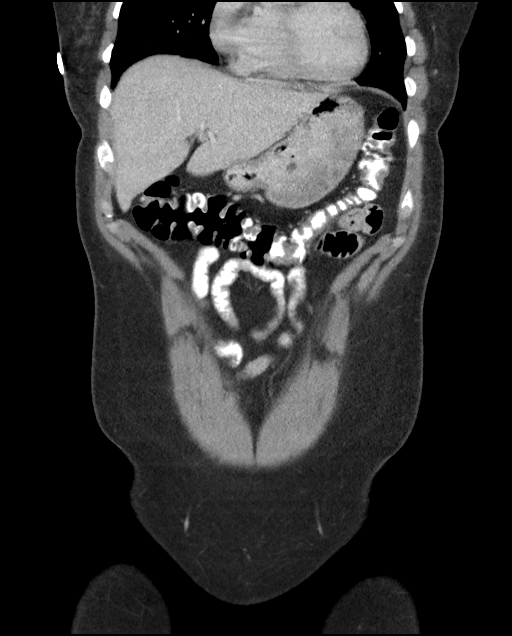
[im 52/116  soft-tissue]
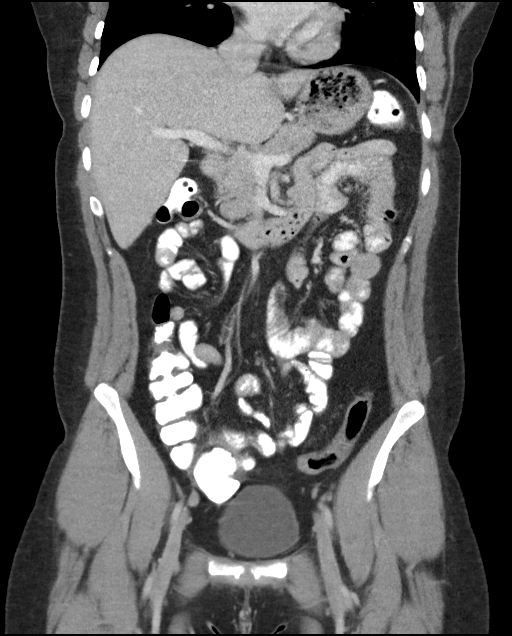
[im 64/116  soft-tissue]
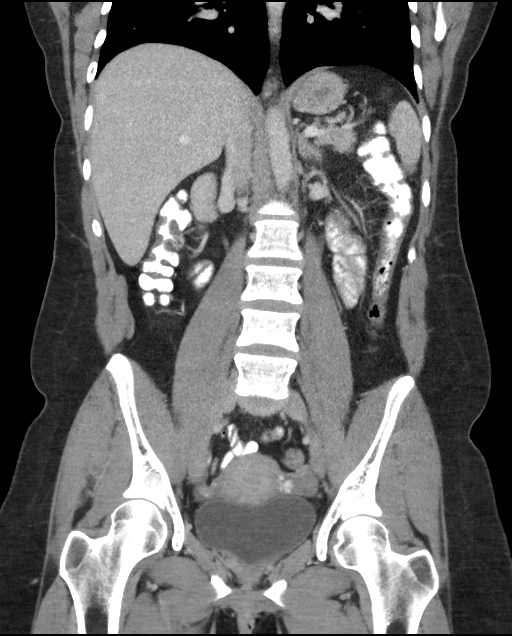

[16 of 46 positions shown; findings below may reference images not displayed]

FINDINGS: Lower chest: Lung bases are clear. No effusions. Heart is normal
size.

Hepatobiliary: No focal liver abnormality is seen. Status post
cholecystectomy. No biliary dilatation.

Pancreas: No focal abnormality or ductal dilatation.

Spleen: No focal abnormality.  Normal size.

Adrenals/Urinary Tract: No adrenal abnormality. No focal renal
abnormality. No stones or hydronephrosis. Urinary bladder is
unremarkable.

Stomach/Bowel: Normal appendix. Stomach, large and small bowel
grossly unremarkable.

Vascular/Lymphatic: No evidence of aneurysm or adenopathy.

Reproductive: Uterus and adnexa unremarkable. No mass. Collapsing
right ovarian cyst measures 2.5 cm.

Other: No free fluid or free air.

Musculoskeletal: No acute bony abnormality.
IMPRESSION: Normal appendix.

No acute findings in the abdomen or pelvis.

Small collapsing cyst or follicle in the right ovary.

## 2021-04-28 DIAGNOSIS — Z20822 Contact with and (suspected) exposure to covid-19: Secondary | ICD-10-CM | POA: Diagnosis not present

## 2021-05-04 ENCOUNTER — Ambulatory Visit (INDEPENDENT_AMBULATORY_CARE_PROVIDER_SITE_OTHER): Payer: BC Managed Care – PPO

## 2021-05-04 VITALS — BP 126/83 | HR 70 | Ht 61.0 in | Wt 164.0 lb

## 2021-05-04 DIAGNOSIS — Z3202 Encounter for pregnancy test, result negative: Secondary | ICD-10-CM | POA: Diagnosis not present

## 2021-05-04 DIAGNOSIS — Z3200 Encounter for pregnancy test, result unknown: Secondary | ICD-10-CM

## 2021-05-04 LAB — POCT URINE PREGNANCY: Preg Test, Ur: NEGATIVE

## 2021-05-04 NOTE — Progress Notes (Addendum)
? ?  NURSE VISIT- PREGNANCY CONFIRMATION  ? ?SUBJECTIVE:  ?[name] Taylor is a 36 y.o. [phone] female at Unknown by certain LMP of Patient's last menstrual period was 03/29/2021 (exact date). Here for pregnancy confirmation.  Home pregnancy test: positive x 1   She reports  spotting, cramping, & nausea .  She is not taking prenatal vitamins.   ? ?OBJECTIVE:  ?BP 126/83 (BP Location: Left Arm, Patient Position: Sitting, Cuff Size: Normal)   Pulse 70   Ht 5\' 1"  (1.549 m)   Wt 164 lb (74.4 kg)   LMP 03/29/2021 (Exact Date)   Breastfeeding No   BMI 30.99 kg/m?   ?Appears well, in no apparent distress ? ?Results for orders placed or performed in visit on 05/04/21 (from the past 24 hour(s))  ?POCT urine pregnancy  ? Collection Time: 05/04/21  4:19 PM  ?Result Value Ref Range  ? Preg Test, Ur Negative Negative  ? ? ?ASSESSMENT: ?Negative pregnancy test   ? ?PLAN: ?Quant HCG to be drawn in lab today ?Prenatal vitamins: plans to begin OTC ASAP   ?Nausea medicines: not currently needed   ?OB packet given: No ? ?[name] Taylor  ?05/04/2021 ?4:20 PM  ?

## 2021-05-05 LAB — BETA HCG QUANT (REF LAB): hCG Quant: <1 m[IU]/mL

## 2021-06-03 ENCOUNTER — Other Ambulatory Visit: Payer: BC Managed Care – PPO | Admitting: Advanced Practice Midwife

## 2021-08-03 ENCOUNTER — Other Ambulatory Visit: Payer: Self-pay | Admitting: Obstetrics & Gynecology

## 2021-08-03 DIAGNOSIS — O3680X0 Pregnancy with inconclusive fetal viability, not applicable or unspecified: Secondary | ICD-10-CM

## 2021-08-04 ENCOUNTER — Ambulatory Visit (INDEPENDENT_AMBULATORY_CARE_PROVIDER_SITE_OTHER): Payer: BC Managed Care – PPO

## 2021-08-04 ENCOUNTER — Other Ambulatory Visit: Payer: Self-pay | Admitting: Obstetrics & Gynecology

## 2021-08-04 ENCOUNTER — Other Ambulatory Visit (HOSPITAL_COMMUNITY)
Admission: RE | Admit: 2021-08-04 | Discharge: 2021-08-04 | Disposition: A | Discharge status: Home / Self Care | Payer: BC Managed Care – PPO | Source: Ambulatory Visit | Attending: Advanced Practice Midwife | Admitting: Advanced Practice Midwife

## 2021-08-04 ENCOUNTER — Ambulatory Visit (INDEPENDENT_AMBULATORY_CARE_PROVIDER_SITE_OTHER): Payer: BC Managed Care – PPO | Admitting: Advanced Practice Midwife

## 2021-08-04 ENCOUNTER — Encounter: Payer: Self-pay | Admitting: Advanced Practice Midwife

## 2021-08-04 VITALS — BP 129/82 | HR 79 | Wt 161.0 lb

## 2021-08-04 DIAGNOSIS — O3680X0 Pregnancy with inconclusive fetal viability, not applicable or unspecified: Secondary | ICD-10-CM | POA: Diagnosis not present

## 2021-08-04 DIAGNOSIS — Z113 Encounter for screening for infections with a predominantly sexual mode of transmission: Secondary | ICD-10-CM

## 2021-08-04 DIAGNOSIS — Z3682 Encounter for antenatal screening for nuchal translucency: Secondary | ICD-10-CM

## 2021-08-04 DIAGNOSIS — Z8751 Personal history of pre-term labor: Secondary | ICD-10-CM | POA: Diagnosis not present

## 2021-08-04 DIAGNOSIS — Z3A11 11 weeks gestation of pregnancy: Secondary | ICD-10-CM | POA: Insufficient documentation

## 2021-08-04 DIAGNOSIS — O09521 Supervision of elderly multigravida, first trimester: Secondary | ICD-10-CM

## 2021-08-04 DIAGNOSIS — Z3481 Encounter for supervision of other normal pregnancy, first trimester: Secondary | ICD-10-CM | POA: Insufficient documentation

## 2021-08-04 DIAGNOSIS — Z124 Encounter for screening for malignant neoplasm of cervix: Secondary | ICD-10-CM | POA: Diagnosis not present

## 2021-08-04 DIAGNOSIS — O09529 Supervision of elderly multigravida, unspecified trimester: Secondary | ICD-10-CM | POA: Insufficient documentation

## 2021-08-04 DIAGNOSIS — Z720 Tobacco use: Secondary | ICD-10-CM

## 2021-08-04 NOTE — Progress Notes (Signed)
INITIAL OBSTETRICAL VISIT Patient name: [name] Taylor MRN [id number]  Date of birth: May 01, 1985 Chief Complaint:   Initial Prenatal Visit  History of Present Illness:   [name] Taylor is a 36 y.o. [id number] Caucasian female at [redacted]w[redacted]d by [place] at (early) weeks with an Estimated Date of Delivery: 02/19/22 being seen today for her initial obstetrical visit.   Patient's last menstrual period was 05/17/2021 (approximate). Her obstetrical history is significant for  term vag del in 2006 and 2011; 24wk spont PTD in 2009 .   Today she reports no complaints.  Last pap (unsure). Results were:  documented ASCUS in 2007 with notation of 'hx LSIL'     08/04/2021    2:29 PM 02/10/2020    2:08 PM 11/09/2017    8:53 AM  Depression screen PHQ 2/9  Decreased Interest 3 0 0  Down, Depressed, Hopeless 0 0 0  PHQ - 2 Score 3 0 0  Altered sleeping 2    Tired, decreased energy 3    Change in appetite 2    Feeling bad or failure about yourself  0    Trouble concentrating 0    Moving slowly or fidgety/restless 0    Suicidal thoughts 0    PHQ-9 Score 10          08/04/2021    2:29 PM  GAD 7 : Generalized Anxiety Score  Nervous, Anxious, on Edge 0  Control/stop worrying 0  Worry too much - different things 0  Trouble relaxing 0  Restless 0  Easily annoyed or irritable 0  Afraid - awful might happen 0  Total GAD 7 Score 0     Review of Systems:   Pertinent items are noted in HPI Denies cramping/contractions, leakage of fluid, vaginal bleeding, abnormal vaginal discharge w/ itching/odor/irritation, headaches, visual changes, shortness of breath, chest pain, abdominal pain, severe nausea/vomiting, or problems with urination or bowel movements unless otherwise stated above.  Pertinent History Reviewed:  Reviewed past medical,surgical, social, obstetrical and family history.  Reviewed problem list, medications and allergies. OB History  Gravida Para Term Preterm AB Living  5 3 2 1 1 3   SAB IAB Ectopic  Multiple Live Births  1       3    # Outcome Date GA Lbr Len/2nd Weight Sex Delivery Anes PTL Lv  5 Current           4 SAB 09/2020          3 Term 11/27/09 [redacted]w[redacted]d   F Vag-Spont  N LIV  2 Preterm 01/27/07 [redacted]w[redacted]d   M Vag-Spont  Y LIV  1 Term 07/10/04 [redacted]w[redacted]d   F Vag-Spont  N LIV   Physical Assessment:   Vitals:   08/04/21 1403  BP: 129/82  Pulse: 79  Weight: 161 lb (73 kg)  Body mass index is 30.42 kg/m.       Physical Examination:  General appearance - well appearing, and in no distress  Mental status - alert, oriented to person, place, and time  Psych:  She has a normal mood and affect  Skin - warm and dry, normal color, no suspicious lesions noted  Chest - effort normal, all lung fields clear to auscultation bilaterally  Heart - normal rate and regular rhythm  Abdomen - soft, nontender  Extremities:  No swelling or varicosities noted  Pelvic - VULVA: normal appearing vulva with no masses, tenderness or lesions  VAGINA: normal appearing vagina with normal color and discharge, no  lesions  CERVIX: normal appearing cervix without discharge or lesions, no CMT  Thin prep pap is done with HR HPV cotesting  Chaperone: Faith Rogue    TODAY'S NT [place] 11+4 wks,single IUP,anterior placenta,normal ovaries,FHR 153 bpm,NB present,NT 1.2 mm  No results found for this or any previous visit (from the past 24 hour(s)).  Assessment & Plan:  1) Low-Risk Pregnancy [id number] at [redacted]w[redacted]d with an Estimated Date of Delivery: 02/19/22   2) Initial OB visit  3) Smoker, down to 2 cig/day  4) Hx spont 24wk PTD with G2 (G1 & G3 full term)  5) AMA, will be 36yo at delivery, no change to care plan  Meds: No orders of the defined types were placed in this encounter.   Initial labs obtained Continue prenatal vitamins Reviewed n/v relief measures and warning s/s to report Reviewed recommended weight gain based on pre-gravid BMI Encouraged well-balanced diet Genetic & carrier screening discussed:  requests Panorama and NT/IT, declines Horizon  Ultrasound discussed; fetal survey: requested CCNC completed> form faxed if has or is planning to apply for medicaid The nature of [facility] [employer] [facility] with multiple MDs and other Advanced Practice Providers was explained to patient; also emphasized that fellows, residents, and students are part of our team. Does not have home bp cuff. Office bp cuff given: yes. Rx sent: n/a. Check bp weekly, let us know if consistently >140/90.   No indications for ASA therapy or early HgbA1c (per uptodate)   Follow-up: Return in about 4 weeks (around 09/01/2021) for LROB, in person, 2nd IT.   Orders Placed This Encounter  Procedures   Urine Culture   GC/Chlamydia Probe Amp   CBC/D/Plt+RPR+Rh+ABO+RubIgG...   Integrated 1   Panorama Prenatal Test Full Panel    Arabella Merles [facility] 08/04/2021 3:40 PM

## 2021-08-04 NOTE — Progress Notes (Signed)
[place] 11+4 wks,single IUP,anterior placenta,normal ovaries,FHR 153 bpm,NB present,NT 1.2 mm

## 2021-08-04 NOTE — Patient Instructions (Signed)
[name] Taylor, thank you for choosing our office today! We appreciate the opportunity to meet your healthcare needs. You may receive a short survey by mail, e-mail, or through MyChart. If you are happy with your care we would appreciate if you could take just a few minutes to complete the survey questions. We read all of your comments and take your feedback very seriously. Thank you again for choosing our office.  [facility] [place] ([address] [place], [place] [address]) Entrance C, located off of [address] Free 24/7 valet parking   Nausea & Vomiting Have saltine crackers or pretzels by your bed and eat a few bites before you raise your head out of bed in the morning Eat small frequent meals throughout the day instead of large meals Drink plenty of fluids throughout the day to stay hydrated, just don't drink a lot of fluids with your meals.  This can make your stomach fill up faster making you feel sick Do not brush your teeth right after you eat Products with real ginger are good for nausea, like ginger ale and ginger hard candy Make sure it says made with real ginger! Sucking on sour candy like lemon heads is also good for nausea If your prenatal vitamins make you nauseated, take them at night so you will sleep through the nausea Sea Bands If you feel like you need medicine for the nausea & vomiting please let us know If you are unable to keep any fluids or food down please let us know   Constipation Drink plenty of fluid, preferably water, throughout the day Eat foods high in fiber such as fruits, vegetables, and grains Exercise, such as walking, is a good way to keep your bowels regular Drink warm fluids, especially warm prune juice, or decaf coffee Eat a 1/2 cup of real oatmeal (not instant), 1/2 cup applesauce, and 1/2-1 cup warm prune juice every day If needed, you may take Colace (docusate sodium) stool softener  once or twice a day to help keep the stool soft.  If you still are having problems with constipation, you may take Miralax once daily as needed to help keep your bowels regular.   Home Blood Pressure Monitoring for Patients   Your provider has recommended that you check your blood pressure (BP) at least once a week at home. If you do not have a blood pressure cuff at home, one will be provided for you. Contact your provider if you have not received your monitor within 1 week.   Helpful Tips for Accurate Home Blood Pressure Checks  Don't smoke, exercise, or drink caffeine 30 minutes before checking your BP Use the restroom before checking your BP (a full bladder can raise your pressure) Relax in a comfortable upright chair Feet on the ground Left arm resting comfortably on a flat surface at the level of your heart Legs uncrossed Back supported Sit quietly and don't talk Place the cuff on your bare arm Adjust snuggly, so that only two fingertips can fit between your skin and the top of the cuff Check 2 readings separated by at least one minute Keep a log of your BP readings For a visual, please reference this diagram: http://ccnc.care/bpdiagram  Provider Name: [facility]     Phone: [phone]  Zone 1: ALL CLEAR  Continue to monitor your symptoms:  BP reading is less than 140 (top number) or less than 90 (bottom   number)  No right upper stomach pain No headaches or seeing spots No feeling nauseated or throwing up No swelling in face and hands  Zone 2: CAUTION Call your doctor's office for any of the following:  BP reading is greater than 140 (top number) or greater than 90 (bottom number)  Stomach pain under your ribs in the middle or right side Headaches or seeing spots Feeling nauseated or throwing up Swelling in face and hands  Zone 3: EMERGENCY  Seek immediate medical care if you have any of the following:  BP reading is greater than160 (top number) or greater than  110 (bottom number) Severe headaches not improving with Tylenol Serious difficulty catching your breath Any worsening symptoms from Zone 2    First Trimester of Pregnancy The first trimester of pregnancy is from week 1 until the end of week 12 (months 1 through 3). A week after a sperm fertilizes an egg, the egg will implant on the wall of the uterus. This embryo will begin to develop into a baby. Genes from you and your partner are forming the baby. The female genes determine whether the baby is a boy or a girl. At 6-8 weeks, the eyes and face are formed, and the heartbeat can be seen on ultrasound. At the end of 12 weeks, all the baby's organs are formed.  Now that you are pregnant, you will want to do everything you can to have a healthy baby. Two of the most important things are to get good prenatal care and to follow your health care provider's instructions. Prenatal care is all the medical care you receive before the baby's birth. This care will help prevent, find, and treat any problems during the pregnancy and childbirth. BODY CHANGES Your body goes through many changes during pregnancy. The changes vary from woman to woman.  You may gain or lose a couple of pounds at first. You may feel sick to your stomach (nauseous) and throw up (vomit). If the vomiting is uncontrollable, call your health care provider. You may tire easily. You may develop headaches that can be relieved by medicines approved by your health care provider. You may urinate more often. Painful urination may mean you have a bladder infection. You may develop heartburn as a result of your pregnancy. You may develop constipation because certain hormones are causing the muscles that push waste through your intestines to slow down. You may develop hemorrhoids or swollen, bulging veins (varicose veins). Your breasts may begin to grow larger and become tender. Your nipples may stick out more, and the tissue that surrounds them  (areola) may become darker. Your gums may bleed and may be sensitive to brushing and flossing. Dark spots or blotches (chloasma, mask of pregnancy) may develop on your face. This will likely fade after the baby is born. Your menstrual periods will stop. You may have a loss of appetite. You may develop cravings for certain kinds of food. You may have changes in your emotions from day to day, such as being excited to be pregnant or being concerned that something may go wrong with the pregnancy and baby. You may have more vivid and strange dreams. You may have changes in your hair. These can include thickening of your hair, rapid growth, and changes in texture. Some women also have hair loss during or after pregnancy, or hair that feels dry or thin. Your hair will most likely return to normal after your baby is born. WHAT TO EXPECT AT YOUR PRENATAL   VISITS During a routine prenatal visit: You will be weighed to make sure you and the baby are growing normally. Your blood pressure will be taken. Your abdomen will be measured to track your baby's growth. The fetal heartbeat will be listened to starting around week 10 or 12 of your pregnancy. Test results from any previous visits will be discussed. Your health care provider may ask you: How you are feeling. If you are feeling the baby move. If you have had any abnormal symptoms, such as leaking fluid, bleeding, severe headaches, or abdominal cramping. If you have any questions. Other tests that may be performed during your first trimester include: Blood tests to find your blood type and to check for the presence of any previous infections. They will also be used to check for low iron levels (anemia) and Rh antibodies. Later in the pregnancy, blood tests for diabetes will be done along with other tests if problems develop. Urine tests to check for infections, diabetes, or protein in the urine. An ultrasound to confirm the proper growth and development  of the baby. An amniocentesis to check for possible genetic problems. Fetal screens for spina bifida and Down syndrome. You may need other tests to make sure you and the baby are doing well. HOME CARE INSTRUCTIONS  Medicines Follow your health care provider's instructions regarding medicine use. Specific medicines may be either safe or unsafe to take during pregnancy. Take your prenatal vitamins as directed. If you develop constipation, try taking a stool softener if your health care provider approves. Diet Eat regular, well-balanced meals. Choose a variety of foods, such as meat or vegetable-based protein, fish, milk and low-fat dairy products, vegetables, fruits, and whole grain breads and cereals. Your health care provider will help you determine the amount of weight gain that is right for you. Avoid raw meat and uncooked cheese. These carry germs that can cause birth defects in the baby. Eating four or five small meals rather than three large meals a day may help relieve nausea and vomiting. If you start to feel nauseous, eating a few soda crackers can be helpful. Drinking liquids between meals instead of during meals also seems to help nausea and vomiting. If you develop constipation, eat more high-fiber foods, such as fresh vegetables or fruit and whole grains. Drink enough fluids to keep your urine clear or pale yellow. Activity and Exercise Exercise only as directed by your health care provider. Exercising will help you: Control your weight. Stay in shape. Be prepared for labor and delivery. Experiencing pain or cramping in the lower abdomen or low back is a good sign that you should stop exercising. Check with your health care provider before continuing normal exercises. Try to avoid standing for long periods of time. Move your legs often if you must stand in one place for a long time. Avoid heavy lifting. Wear low-heeled shoes, and practice good posture. You may continue to have sex  unless your health care provider directs you otherwise. Relief of Pain or Discomfort Wear a good support bra for breast tenderness.   Take warm sitz baths to soothe any pain or discomfort caused by hemorrhoids. Use hemorrhoid cream if your health care provider approves.   Rest with your legs elevated if you have leg cramps or low back pain. If you develop varicose veins in your legs, wear support hose. Elevate your feet for 15 minutes, 3-4 times a day. Limit salt in your diet. Prenatal Care Schedule your prenatal visits by the   twelfth week of pregnancy. They are usually scheduled monthly at first, then more often in the last 2 months before delivery. Write down your questions. Take them to your prenatal visits. Keep all your prenatal visits as directed by your health care provider. Safety Wear your seat belt at all times when driving. Make a list of emergency phone numbers, including numbers for family, friends, the hospital, and police and fire departments. General Tips Ask your health care provider for a referral to a local prenatal education class. Begin classes no later than at the beginning of month 6 of your pregnancy. Ask for help if you have counseling or nutritional needs during pregnancy. Your health care provider can offer advice or refer you to specialists for help with various needs. Do not use hot tubs, steam rooms, or saunas. Do not douche or use tampons or scented sanitary pads. Do not cross your legs for long periods of time. Avoid cat litter boxes and soil used by cats. These carry germs that can cause birth defects in the baby and possibly loss of the fetus by miscarriage or stillbirth. Avoid all smoking, herbs, alcohol, and medicines not prescribed by your health care provider. Chemicals in these affect the formation and growth of the baby. Schedule a dentist appointment. At home, brush your teeth with a soft toothbrush and be gentle when you floss. SEEK MEDICAL CARE IF:   You have dizziness. You have mild pelvic cramps, pelvic pressure, or nagging pain in the abdominal area. You have persistent nausea, vomiting, or diarrhea. You have a bad smelling vaginal discharge. You have pain with urination. You notice increased swelling in your face, hands, legs, or ankles. SEEK IMMEDIATE MEDICAL CARE IF:  You have a fever. You are leaking fluid from your vagina. You have spotting or bleeding from your vagina. You have severe abdominal cramping or pain. You have rapid weight gain or loss. You vomit blood or material that looks like coffee grounds. You are exposed to German measles and have never had them. You are exposed to fifth disease or chickenpox. You develop a severe headache. You have shortness of breath. You have any kind of trauma, such as from a fall or a car accident. Document Released: 12/14/2000 Document Revised: 05/06/2013 Document Reviewed: 10/30/2012 Exit[employer] Patient Information 2015 [employer]. This information is not intended to replace advice given to you by your health care provider. Make sure you discuss any questions you have with your health care provider.  

## 2021-08-06 LAB — CBC/D/PLT+RPR+RH+ABO+RUBIGG...
Antibody Screen: NEGATIVE
Basophils Absolute: 0.1 10*3/uL (ref 0.0–0.2)
Basos: 0 %
EOS (ABSOLUTE): 0.2 10*3/uL (ref 0.0–0.4)
Eos: 1 %
HCV Ab: NONREACTIVE
HIV Screen 4th Generation wRfx: NONREACTIVE
Hematocrit: 36.3 % (ref 34.0–46.6)
Hemoglobin: 12.6 g/dL (ref 11.1–15.9)
Hepatitis B Surface Ag: NEGATIVE
Immature Grans (Abs): 0.1 10*3/uL (ref 0.0–0.1)
Immature Granulocytes: 1 %
Lymphocytes Absolute: 4.4 10*3/uL — ABNORMAL HIGH (ref 0.7–3.1)
Lymphs: 25 %
MCH: 33.1 pg — ABNORMAL HIGH (ref 26.6–33.0)
MCHC: 34.7 g/dL (ref 31.5–35.7)
MCV: 95 fL (ref 79–97)
Monocytes Absolute: 0.9 10*3/uL (ref 0.1–0.9)
Monocytes: 5 %
Neutrophils Absolute: 12 10*3/uL — ABNORMAL HIGH (ref 1.4–7.0)
Neutrophils: 68 %
Platelets: 316 10*3/uL (ref 150–450)
RBC: 3.81 x10E6/uL (ref 3.77–5.28)
RDW: 11.9 % (ref 11.7–15.4)
RPR Ser Ql: NONREACTIVE
Rh Factor: POSITIVE
Rubella Antibodies, IGG: 1.42 index (ref >0.99)
WBC: 17.6 10*3/uL — ABNORMAL HIGH (ref 3.4–10.8)

## 2021-08-06 LAB — INTEGRATED 1
Crown Rump Length: 49.8 mm
Gest. Age on Collection Date: 11.6 weeks
Maternal Age at EDD: 35.4 yr
Nuchal Translucency (NT): 1.2 mm
Number of Fetuses: 1
PAPP-A Value: 489.1 ng/mL
Weight: 161 [lb_av]

## 2021-08-06 LAB — HCV INTERPRETATION

## 2021-08-06 LAB — URINE CULTURE

## 2021-08-08 ENCOUNTER — Encounter: Payer: Self-pay | Admitting: Advanced Practice Midwife

## 2021-08-08 DIAGNOSIS — O0990 Supervision of high risk pregnancy, unspecified, unspecified trimester: Secondary | ICD-10-CM | POA: Insufficient documentation

## 2021-08-08 DIAGNOSIS — Z3480 Encounter for supervision of other normal pregnancy, unspecified trimester: Secondary | ICD-10-CM | POA: Insufficient documentation

## 2021-08-08 LAB — GC/CHLAMYDIA PROBE AMP
Chlamydia trachomatis, NAA: NEGATIVE
Neisseria Gonorrhoeae by PCR: NEGATIVE

## 2021-08-09 LAB — CYTOLOGY - PAP
Comment: NEGATIVE
Diagnosis: NEGATIVE
High risk HPV: NEGATIVE

## 2021-08-10 LAB — PANORAMA PRENATAL TEST FULL PANEL:PANORAMA TEST PLUS 5 ADDITIONAL MICRODELETIONS: FETAL FRACTION: 7.4

## 2021-09-01 ENCOUNTER — Encounter: Payer: Self-pay | Admitting: Advanced Practice Midwife

## 2021-09-01 ENCOUNTER — Ambulatory Visit (INDEPENDENT_AMBULATORY_CARE_PROVIDER_SITE_OTHER): Payer: BC Managed Care – PPO | Admitting: Advanced Practice Midwife

## 2021-09-01 VITALS — BP 123/74 | HR 68 | Wt 160.0 lb

## 2021-09-01 DIAGNOSIS — Z3A15 15 weeks gestation of pregnancy: Secondary | ICD-10-CM | POA: Diagnosis not present

## 2021-09-01 DIAGNOSIS — Z363 Encounter for antenatal screening for malformations: Secondary | ICD-10-CM

## 2021-09-01 DIAGNOSIS — Z3480 Encounter for supervision of other normal pregnancy, unspecified trimester: Secondary | ICD-10-CM | POA: Diagnosis not present

## 2021-09-01 NOTE — Patient Instructions (Signed)
[name] Taylor, thank you for choosing our office today! We appreciate the opportunity to meet your healthcare needs. You may receive a short survey by mail, e-mail, or through MyChart. If you are happy with your care we would appreciate if you could take just a few minutes to complete the survey questions. We read all of your comments and take your feedback very seriously. Thank you again for choosing our office.  [facility] Team at [facility] at  ([address] [place], [place] [address]) Entrance C, located off of [address] Free 24/7 valet parking  Go to [email or web address] to register for FREE online childbirth classes  Call the office ([phone]) or go to [facility] if: You begin to severe cramping Your water breaks.  Sometimes it is a big gush of fluid, sometimes it is just a trickle that keeps getting your panties wet or running down your legs You have vaginal bleeding.  It is normal to have a small amount of spotting if your cervix was checked.   [place] [facility]/Family Doctors [facility] ([facility]): [address], [phone]           [facility]: [address], [phone]                [facility] ([facility]): [address], [phone] (call to ask if accepting patients) [facility]: [address], [place], [phone]    [place] Pediatric[facility]/Family Doctors [facility] ([facility]): [address], [phone] [facility]: [address], [phone] [facility]: [address], [phone]  [place] [facility]  [facility] ([facility]): [phone] [facility]: [address], [phone]   [place] [facility]: [address], [phone]  [place] [facility]  [facility]: [address], [phone]  Home Blood Pressure Monitoring for Patients   Your provider has recommended that you check your blood pressure (BP) at least once a week at home. If you do not have a blood pressure cuff at home, one will be provided for you. Contact your provider if you have not received your monitor within 1 week.   Helpful Tips for Accurate Home Blood Pressure Checks  Don't smoke, exercise, or drink caffeine 30 minutes before checking your BP Use the restroom before checking your BP (a full bladder can raise your pressure) Relax in a comfortable upright chair Feet on the ground Left arm resting comfortably on a flat surface at the level of your heart Legs uncrossed Back supported Sit quietly and don't talk Place the cuff on your bare arm Adjust snuggly, so that only two fingertips can fit between your skin and the top of the cuff Check 2 readings separated by at least one minute Keep a log of your BP readings For a visual, please reference this diagram: http://ccnc.care/bpdiagram  Provider Name: [facility]     Phone: [phone]  Zone 1: ALL CLEAR  Continue to monitor your symptoms:  BP reading is less than 140 (top number) or less than 90 (bottom number)  No right upper stomach pain No headaches or seeing spots No feeling nauseated or throwing up No swelling in face and hands  Zone 2: CAUTION Call your doctor's office for any of the following:  BP reading is greater than 140 (top number) or greater than   90 (bottom number)  Stomach pain under your ribs in the middle or right side Headaches or seeing spots Feeling nauseated or throwing up Swelling in face and hands  Zone 3: EMERGENCY  Seek immediate medical care if you have any of the following:  BP reading is greater than160 (top number) or greater than 110 (bottom number) Severe headaches not improving with Tylenol Serious difficulty catching your breath Any worsening symptoms from  Zone 2     Second Trimester of Pregnancy The second trimester is from week 14 through week 27 (months 4 through 6). The second trimester is often a time when you feel your best. Your body has adjusted to being pregnant, and you begin to feel better physically. Usually, morning sickness has lessened or quit completely, you may have more energy, and you may have an increase in appetite. The second trimester is also a time when the fetus is growing rapidly. At the end of the sixth month, the fetus is about 9 inches long and weighs about 1 pounds. You will likely begin to feel the baby move (quickening) between 16 and 20 weeks of pregnancy. Body changes during your second trimester Your body continues to go through many changes during your second trimester. The changes vary from woman to woman. Your weight will continue to increase. You will notice your lower abdomen bulging out. You may begin to get stretch marks on your hips, abdomen, and breasts. You may develop headaches that can be relieved by medicines. The medicines should be approved by your health care provider. You may urinate more often because the fetus is pressing on your bladder. You may develop or continue to have heartburn as a result of your pregnancy. You may develop constipation because certain hormones are causing the muscles that push waste through your intestines to slow down. You may develop hemorrhoids or swollen, bulging veins (varicose veins). You may have back pain. This is caused by: Weight gain. Pregnancy hormones that are relaxing the joints in your pelvis. A shift in weight and the muscles that support your balance. Your breasts will continue to grow and they will continue to become tender. Your gums may bleed and may be sensitive to brushing and flossing. Dark spots or blotches (chloasma, mask of pregnancy) may develop on your face. This will likely fade after the baby is born. A dark line from your belly button to  the pubic area (linea nigra) may appear. This will likely fade after the baby is born. You may have changes in your hair. These can include thickening of your hair, rapid growth, and changes in texture. Some women also have hair loss during or after pregnancy, or hair that feels dry or thin. Your hair will most likely return to normal after your baby is born.  What to expect at prenatal visits During a routine prenatal visit: You will be weighed to make sure you and the fetus are growing normally. Your blood pressure will be taken. Your abdomen will be measured to track your baby's growth. The fetal heartbeat will be listened to. Any test results from the previous visit will be discussed.  Your health care provider may ask you: How you are feeling. If you are feeling the baby move. If you have had any abnormal symptoms, such as leaking fluid, bleeding, severe headaches, or abdominal cramping. If you are using any tobacco products, including cigarettes, chewing tobacco, and electronic cigarettes. If you have any questions.  Other tests that may be performed during   your second trimester include: Blood tests that check for: Low iron levels (anemia). High blood sugar that affects pregnant women (gestational diabetes) between 24 and 28 weeks. Rh antibodies. This is to check for a protein on red blood cells (Rh factor). Urine tests to check for infections, diabetes, or protein in the urine. An ultrasound to confirm the proper growth and development of the baby. An amniocentesis to check for possible genetic problems. Fetal screens for spina bifida and Down syndrome. HIV (human immunodeficiency virus) testing. Routine prenatal testing includes screening for HIV, unless you choose not to have this test.  Follow these instructions at home: Medicines Follow your health care provider's instructions regarding medicine use. Specific medicines may be either safe or unsafe to take during  pregnancy. Take a prenatal vitamin that contains at least 600 micrograms (mcg) of folic acid. If you develop constipation, try taking a stool softener if your health care provider approves. Eating and drinking Eat a balanced diet that includes fresh fruits and vegetables, whole grains, good sources of protein such as meat, eggs, or tofu, and low-fat dairy. Your health care provider will help you determine the amount of weight gain that is right for you. Avoid raw meat and uncooked cheese. These carry germs that can cause birth defects in the baby. If you have low calcium intake from food, talk to your health care provider about whether you should take a daily calcium supplement. Limit foods that are high in fat and processed sugars, such as fried and sweet foods. To prevent constipation: Drink enough fluid to keep your urine clear or pale yellow. Eat foods that are high in fiber, such as fresh fruits and vegetables, whole grains, and beans. Activity Exercise only as directed by your health care provider. Most women can continue their usual exercise routine during pregnancy. Try to exercise for 30 minutes at least 5 days a week. Stop exercising if you experience uterine contractions. Avoid heavy lifting, wear low heel shoes, and practice good posture. A sexual relationship may be continued unless your health care provider directs you otherwise. Relieving pain and discomfort Wear a good support bra to prevent discomfort from breast tenderness. Take warm sitz baths to soothe any pain or discomfort caused by hemorrhoids. Use hemorrhoid cream if your health care provider approves. Rest with your legs elevated if you have leg cramps or low back pain. If you develop varicose veins, wear support hose. Elevate your feet for 15 minutes, 3-4 times a day. Limit salt in your diet. Prenatal Care Write down your questions. Take them to your prenatal visits. Keep all your prenatal visits as told by your health  care provider. This is important. Safety Wear your seat belt at all times when driving. Make a list of emergency phone numbers, including numbers for family, friends, the hospital, and police and fire departments. General instructions Ask your health care provider for a referral to a local prenatal education class. Begin classes no later than the beginning of month 6 of your pregnancy. Ask for help if you have counseling or nutritional needs during pregnancy. Your health care provider can offer advice or refer you to specialists for help with various needs. Do not use hot tubs, steam rooms, or saunas. Do not douche or use tampons or scented sanitary pads. Do not cross your legs for long periods of time. Avoid cat litter boxes and soil used by cats. These carry germs that can cause birth defects in the baby and possibly loss of the   fetus by miscarriage or stillbirth. Avoid all smoking, herbs, alcohol, and unprescribed drugs. Chemicals in these products can affect the formation and growth of the baby. Do not use any products that contain nicotine or tobacco, such as cigarettes and e-cigarettes. If you need help quitting, ask your health care provider. Visit your dentist if you have not gone yet during your pregnancy. Use a soft toothbrush to brush your teeth and be gentle when you floss. Contact a health care provider if: You have dizziness. You have mild pelvic cramps, pelvic pressure, or nagging pain in the abdominal area. You have persistent nausea, vomiting, or diarrhea. You have a bad smelling vaginal discharge. You have pain when you urinate. Get help right away if: You have a fever. You are leaking fluid from your vagina. You have spotting or bleeding from your vagina. You have severe abdominal cramping or pain. You have rapid weight gain or weight loss. You have shortness of breath with chest pain. You notice sudden or extreme swelling of your face, hands, ankles, feet, or legs. You  have not felt your baby move in over an hour. You have severe headaches that do not go away when you take medicine. You have vision changes. Summary The second trimester is from week 14 through week 27 (months 4 through 6). It is also a time when the fetus is growing rapidly. Your body goes through many changes during pregnancy. The changes vary from woman to woman. Avoid all smoking, herbs, alcohol, and unprescribed drugs. These chemicals affect the formation and growth your baby. Do not use any tobacco products, such as cigarettes, chewing tobacco, and e-cigarettes. If you need help quitting, ask your health care provider. Contact your health care provider if you have any questions. Keep all prenatal visits as told by your health care provider. This is important. This information is not intended to replace advice given to you by your health care provider. Make sure you discuss any questions you have with your health care provider. Document Released: 12/14/2000 Document Revised: 05/28/2015 Document Reviewed: 02/21/2012 Elsevier Interactive Patient Education  2017 [employer]  

## 2021-09-01 NOTE — Progress Notes (Signed)
   LOW-RISK PREGNANCY VISIT Patient name: FARRAH SKODA MRN [id number]  Date of birth: [birth year] Chief Complaint:   Routine Prenatal Visit  History of Present Illness:   [name] Taylor is a 36 y.o. [id number] female at [redacted]w[redacted]d with an Estimated Date of Delivery: 02/19/22 being seen today for ongoing management of a low-risk pregnancy.  Today she reports no complaints. Contractions: Not present. Vag. Bleeding: None.   . denies leaking of fluid. Review of Systems:   Pertinent items are noted in HPI Denies abnormal vaginal discharge w/ itching/odor/irritation, headaches, visual changes, shortness of breath, chest pain, abdominal pain, severe nausea/vomiting, or problems with urination or bowel movements unless otherwise stated above. Pertinent History Reviewed:  Reviewed past medical,surgical, social, obstetrical and family history.  Reviewed problem list, medications and allergies. Physical Assessment:   Vitals:   09/01/21 1345  BP: 123/74  Pulse: 68  Weight: 160 lb (72.6 kg)  Body mass index is 30.23 kg/m.        Physical Examination:   General appearance: Well appearing, and in no distress  Mental status: Alert, oriented to person, place, and time  Skin: Warm & dry  Cardiovascular: Normal heart rate noted  Respiratory: Normal respiratory effort, no distress  Abdomen: Soft, gravid, nontender  Pelvic: Cervical exam deferred         Extremities: Edema: None  Fetal Status: Fetal Heart Rate (bpm): 143        No results found for this or any previous visit (from the past 24 hour(s)).  Assessment & Plan:  1) Low-risk pregnancy [id number] at [redacted]w[redacted]d with an Estimated Date of Delivery: 02/19/22   2) Hx 24wk PTB, check cx length at anatomy scan   Meds: No orders of the defined types were placed in this encounter.  Labs/procedures today: 2nd IT  Plan:  Continue routine obstetrical care   Reviewed: Preterm labor symptoms and general obstetric precautions including but not limited to vaginal  bleeding, contractions, leaking of fluid and fetal movement were reviewed in detail with the patient.  All questions were answered. Has home bp cuff.  Check bp weekly, let us know if >140/90.   Follow-up: Return in about 4 weeks (around 09/29/2021) for LROB, [place]: Anatomy.  Orders Placed This Encounter  Procedures   US OB Comp + 14 Wk   INTEGRATED 2   Arabella Merles [facility] 09/01/2021 1:57 PM

## 2021-09-03 LAB — INTEGRATED 2
AFP MoM: 1.8
Alpha-Fetoprotein: 52.8 ng/mL
Crown Rump Length: 49.8 mm
DIA MoM: 2.16
DIA Value: 328.6 pg/mL
Estriol, Unconjugated: 1.27 ng/mL
Gest. Age on Collection Date: 11.6 weeks
Gestational Age: 15.6 weeks
Maternal Age at EDD: 35.4 yr
Nuchal Translucency (NT): 1.2 mm
Nuchal Translucency MoM: 1.07
Number of Fetuses: 1
PAPP-A MoM: 0.76
PAPP-A Value: 489.1 ng/mL
Test Results:: NEGATIVE
Weight: 161 [lb_av]
Weight: 161 [lb_av]
hCG MoM: 1.61
hCG Value: 65 IU/mL
uE3 MoM: 1.54

## 2021-09-07 ENCOUNTER — Telehealth: Payer: Self-pay | Admitting: Obstetrics & Gynecology

## 2021-09-07 ENCOUNTER — Encounter: Payer: Self-pay | Admitting: *Deleted

## 2021-09-07 NOTE — Telephone Encounter (Signed)
Patient called needing a note that allows her to take more bathroom breaks at work. Please advise.

## 2021-09-29 ENCOUNTER — Encounter: Payer: Self-pay | Admitting: Advanced Practice Midwife

## 2021-09-29 ENCOUNTER — Ambulatory Visit (INDEPENDENT_AMBULATORY_CARE_PROVIDER_SITE_OTHER): Payer: BC Managed Care – PPO | Admitting: Advanced Practice Midwife

## 2021-09-29 ENCOUNTER — Ambulatory Visit (INDEPENDENT_AMBULATORY_CARE_PROVIDER_SITE_OTHER): Payer: BC Managed Care – PPO

## 2021-09-29 VITALS — BP 115/67 | HR 69 | Wt 166.0 lb

## 2021-09-29 DIAGNOSIS — O09522 Supervision of elderly multigravida, second trimester: Secondary | ICD-10-CM

## 2021-09-29 DIAGNOSIS — Z363 Encounter for antenatal screening for malformations: Secondary | ICD-10-CM | POA: Diagnosis not present

## 2021-09-29 DIAGNOSIS — Z3A19 19 weeks gestation of pregnancy: Secondary | ICD-10-CM

## 2021-09-29 DIAGNOSIS — Z3480 Encounter for supervision of other normal pregnancy, unspecified trimester: Secondary | ICD-10-CM

## 2021-09-29 DIAGNOSIS — Z8751 Personal history of pre-term labor: Secondary | ICD-10-CM

## 2021-09-29 NOTE — Patient Instructions (Signed)
Rashan, thank you for choosing our office today! We appreciate the opportunity to meet your healthcare needs. You may receive a short survey by mail, e-mail, or through MyChart. If you are happy with your care we would appreciate if you could take just a few minutes to complete the survey questions. We read all of your comments and take your feedback very seriously. Thank you again for choosing our office.  [facility] Team at [facility] at  ([address] [place], [place] [address]) Entrance C, located off of [address] Free 24/7 valet parking  Go to [email or web address] to register for FREE online childbirth classes  Call the office ([phone]) or go to [facility] if: You begin to severe cramping Your water breaks.  Sometimes it is a big gush of fluid, sometimes it is just a trickle that keeps getting your panties wet or running down your legs You have vaginal bleeding.  It is normal to have a small amount of spotting if your cervix was checked.   [place] [facility]/Family Doctors [facility] ([facility]): [address], [phone]           [facility]: [address], [phone]                [facility] ([facility]): [address], [phone] (call to ask if accepting patients) [facility]: [address], [place], [phone]    [place] [facility]/Family Doctors [facility] ([facility]): [address], [phone] [facility]: [address], [phone] [facility]: [address], [phone]  [place] [facility]  [facility] ([facility]): [phone] [facility]: [address], [phone]   [place] [facility] Doctors [facility]: [address], [phone]  [place] [facility]  [facility]: [address], [phone]  Home Blood Pressure Monitoring for Patients   Your provider has recommended that you check your blood pressure (BP) at least once a week at home. If you do not have a blood pressure cuff at home, one will be provided for you. Contact your provider if you have not received your monitor within 1 week.   Helpful Tips for Accurate Home Blood Pressure Checks  Don't smoke, exercise, or drink caffeine 30 minutes before checking your BP Use the restroom before checking your BP (a full bladder can raise your pressure) Relax in a comfortable upright chair Feet on the ground Left arm resting comfortably on a flat surface at the level of your heart Legs uncrossed Back supported Sit quietly and don't talk Place the cuff on your bare arm Adjust snuggly, so that only two fingertips can fit between your skin and the top of the cuff Check 2 readings separated by at least one minute Keep a log of your BP readings For a visual, please reference this diagram: http://[facility].care/bpdiagram  Provider Name: [facility]     Phone: [phone]  Zone 1: ALL CLEAR  Continue to monitor your symptoms:  BP reading is less than 140 (top number) or less than 90 (bottom number)  No right upper stomach pain No headaches or seeing spots No feeling nauseated or throwing up No swelling in face and hands  Zone 2: CAUTION Call your doctor's office for any of the following:  BP reading is greater than 140 (top number) or greater than   90 (bottom number)  Stomach pain under your ribs in the middle or right side Headaches or seeing spots Feeling nauseated or throwing up Swelling in face and hands  Zone 3: EMERGENCY  Seek immediate medical care if you have any of the following:  BP reading is greater than160 (top number) or greater than 110 (bottom number) Severe headaches not improving with Tylenol Serious difficulty catching your breath Any worsening symptoms from  Zone 2     Second Trimester of Pregnancy The second trimester is from week 14 through week 27 (months 4 through 6). The second trimester is often a time when you feel your best. Your body has adjusted to being pregnant, and you begin to feel better physically. Usually, morning sickness has lessened or quit completely, you may have more energy, and you may have an increase in appetite. The second trimester is also a time when the fetus is growing rapidly. At the end of the sixth month, the fetus is about 9 inches long and weighs about 1 pounds. You will likely begin to feel the baby move (quickening) between 16 and 20 weeks of pregnancy. Body changes during your second trimester Your body continues to go through many changes during your second trimester. The changes vary from woman to woman. Your weight will continue to increase. You will notice your lower abdomen bulging out. You may begin to get stretch marks on your hips, abdomen, and breasts. You may develop headaches that can be relieved by medicines. The medicines should be approved by your health care provider. You may urinate more often because the fetus is pressing on your bladder. You may develop or continue to have heartburn as a result of your pregnancy. You may develop constipation because certain hormones are causing the muscles that push waste through your intestines to slow down. You may develop hemorrhoids or swollen, bulging veins (varicose veins). You may have back pain. This is caused by: Weight gain. Pregnancy hormones that are relaxing the joints in your pelvis. A shift in weight and the muscles that support your balance. Your breasts will continue to grow and they will continue to become tender. Your gums may bleed and may be sensitive to brushing and flossing. Dark spots or blotches (chloasma, mask of pregnancy) may develop on your face. This will likely fade after the baby is born. A dark line from your belly button to  the pubic area (linea nigra) may appear. This will likely fade after the baby is born. You may have changes in your hair. These can include thickening of your hair, rapid growth, and changes in texture. Some women also have hair loss during or after pregnancy, or hair that feels dry or thin. Your hair will most likely return to normal after your baby is born.  What to expect at prenatal visits During a routine prenatal visit: You will be weighed to make sure you and the fetus are growing normally. Your blood pressure will be taken. Your abdomen will be measured to track your baby's growth. The fetal heartbeat will be listened to. Any test results from the previous visit will be discussed.  Your health care provider may ask you: How you are feeling. If you are feeling the baby move. If you have had any abnormal symptoms, such as leaking fluid, bleeding, severe headaches, or abdominal cramping. If you are using any tobacco products, including cigarettes, chewing tobacco, and electronic cigarettes. If you have any questions.  Other tests that may be performed during   your second trimester include: Blood tests that check for: Low iron levels (anemia). High blood sugar that affects pregnant women (gestational diabetes) between 24 and 28 weeks. Rh antibodies. This is to check for a protein on red blood cells (Rh factor). Urine tests to check for infections, diabetes, or protein in the urine. An ultrasound to confirm the proper growth and development of the baby. An amniocentesis to check for possible genetic problems. Fetal screens for spina bifida and Down syndrome. HIV (human immunodeficiency virus) testing. Routine prenatal testing includes screening for HIV, unless you choose not to have this test.  Follow these instructions at home: Medicines Follow your health care provider's instructions regarding medicine use. Specific medicines may be either safe or unsafe to take during  pregnancy. Take a prenatal vitamin that contains at least 600 micrograms (mcg) of folic acid. If you develop constipation, try taking a stool softener if your health care provider approves. Eating and drinking Eat a balanced diet that includes fresh fruits and vegetables, whole grains, good sources of protein such as meat, eggs, or tofu, and low-fat dairy. Your health care provider will help you determine the amount of weight gain that is right for you. Avoid raw meat and uncooked cheese. These carry germs that can cause birth defects in the baby. If you have low calcium intake from food, talk to your health care provider about whether you should take a daily calcium supplement. Limit foods that are high in fat and processed sugars, such as fried and sweet foods. To prevent constipation: Drink enough fluid to keep your urine clear or pale yellow. Eat foods that are high in fiber, such as fresh fruits and vegetables, whole grains, and beans. Activity Exercise only as directed by your health care provider. Most women can continue their usual exercise routine during pregnancy. Try to exercise for 30 minutes at least 5 days a week. Stop exercising if you experience uterine contractions. Avoid heavy lifting, wear low heel shoes, and practice good posture. A sexual relationship may be continued unless your health care provider directs you otherwise. Relieving pain and discomfort Wear a good support bra to prevent discomfort from breast tenderness. Take warm sitz baths to soothe any pain or discomfort caused by hemorrhoids. Use hemorrhoid cream if your health care provider approves. Rest with your legs elevated if you have leg cramps or low back pain. If you develop varicose veins, wear support hose. Elevate your feet for 15 minutes, 3-4 times a day. Limit salt in your diet. Prenatal Care Write down your questions. Take them to your prenatal visits. Keep all your prenatal visits as told by your health  care provider. This is important. Safety Wear your seat belt at all times when driving. Make a list of emergency phone numbers, including numbers for family, friends, the hospital, and police and fire departments. General instructions Ask your health care provider for a referral to a local prenatal education class. Begin classes no later than the beginning of month 6 of your pregnancy. Ask for help if you have counseling or nutritional needs during pregnancy. Your health care provider can offer advice or refer you to specialists for help with various needs. Do not use hot tubs, steam rooms, or saunas. Do not douche or use tampons or scented sanitary pads. Do not cross your legs for long periods of time. Avoid cat litter boxes and soil used by cats. These carry germs that can cause birth defects in the baby and possibly loss of the   fetus by miscarriage or stillbirth. Avoid all smoking, herbs, alcohol, and unprescribed drugs. Chemicals in these products can affect the formation and growth of the baby. Do not use any products that contain nicotine or tobacco, such as cigarettes and e-cigarettes. If you need help quitting, ask your health care provider. Visit your dentist if you have not gone yet during your pregnancy. Use a soft toothbrush to brush your teeth and be gentle when you floss. Contact a health care provider if: You have dizziness. You have mild pelvic cramps, pelvic pressure, or nagging pain in the abdominal area. You have persistent nausea, vomiting, or diarrhea. You have a bad smelling vaginal discharge. You have pain when you urinate. Get help right away if: You have a fever. You are leaking fluid from your vagina. You have spotting or bleeding from your vagina. You have severe abdominal cramping or pain. You have rapid weight gain or weight loss. You have shortness of breath with chest pain. You notice sudden or extreme swelling of your face, hands, ankles, feet, or legs. You  have not felt your baby move in over an hour. You have severe headaches that do not go away when you take medicine. You have vision changes. Summary The second trimester is from week 14 through week 27 (months 4 through 6). It is also a time when the fetus is growing rapidly. Your body goes through many changes during pregnancy. The changes vary from woman to woman. Avoid all smoking, herbs, alcohol, and unprescribed drugs. These chemicals affect the formation and growth your baby. Do not use any tobacco products, such as cigarettes, chewing tobacco, and e-cigarettes. If you need help quitting, ask your health care provider. Contact your health care provider if you have any questions. Keep all prenatal visits as told by your health care provider. This is important. This information is not intended to replace advice given to you by your health care provider. Make sure you discuss any questions you have with your health care provider. Document Released: 12/14/2000 Document Revised: 05/28/2015 Document Reviewed: 02/21/2012 Elsevier Interactive Patient Education  2017 Elsevier Inc.  

## 2021-09-29 NOTE — Progress Notes (Signed)
   LOW-RISK PREGNANCY VISIT Patient name: CHANON LONEY MRN [id number]  Date of birth: Apr 14, 1985 Chief Complaint:   Routine Prenatal Visit  History of Present Illness:   NIDIA GROGAN is a 36 y.o. [id number] female at [redacted]w[redacted]d with an Estimated Date of Delivery: 02/19/22 being seen today for ongoing management of a low-risk pregnancy.  Today she reports  BLE swelling and wrist numbness/tingling . Contractions: Not present. Vag. Bleeding: None.  Movement: Absent. denies leaking of fluid. Review of Systems:   Pertinent items are noted in HPI Denies abnormal vaginal discharge w/ itching/odor/irritation, headaches, visual changes, shortness of breath, chest pain, abdominal pain, severe nausea/vomiting, or problems with urination or bowel movements unless otherwise stated above. Pertinent History Reviewed:  Reviewed past medical,surgical, social, obstetrical and family history.  Reviewed problem list, medications and allergies. Physical Assessment:   Vitals:   09/29/21 1036  BP: 115/67  Pulse: 69  Weight: 166 lb (75.3 kg)  Body mass index is 31.37 kg/m.        Physical Examination:   General appearance: Well appearing, and in no distress  Mental status: Alert, oriented to person, place, and time  Skin: Warm & dry  Cardiovascular: Normal heart rate noted  Respiratory: Normal respiratory effort, no distress  Abdomen: Soft, gravid, nontender  Pelvic: Cervical exam deferred         Extremities: Edema: Trace  Fetal Status: Fetal Heart Rate (bpm): 159 u/s   Movement: Absent    Anatomy u/s: [place] 19+4 wks,breech,FHR 159 bpm,cx 2.7 cm,anterior placenta gr 0,normal ovaries,SVP of fluid 4 cm,EFW 303 g 46%,anatomy complete,no obvious abnormalities   No results found for this or any previous visit (from the past 24 hour(s)).  Assessment & Plan:  1) Low-risk pregnancy [id number] at [redacted]w[redacted]d with an Estimated Date of Delivery: 02/19/22   2) Hand numbness/tingling, rec wrist supports- start at night and  increase during the day prn  3) BLE edema, rec 64oz/water qday and elevate; BPs are stable  4) Hx 24wk delivery, cx 2.7cm long today   Meds: No orders of the defined types were placed in this encounter.  Labs/procedures today: anatomy u/s  Plan:  Continue routine obstetrical care   Reviewed: Preterm labor symptoms and general obstetric precautions including but not limited to vaginal bleeding, contractions, leaking of fluid and fetal movement were reviewed in detail with the patient.  All questions were answered. Has home bp cuff. Check bp weekly, let us know if >140/90.   Follow-up: Return in about 4 weeks (around 10/27/2021) for [address], in person.  No orders of the defined types were placed in this encounter.  Myrtis Ser CNM 09/29/2021 10:57 AM

## 2021-09-29 NOTE — Progress Notes (Signed)
[place] 19+4 wks,breech,FHR 159 bpm,cx 2.7 cm,anterior placenta gr 0,normal ovaries,SVP of fluid 4 cm,EFW 303 g 46%,anatomy complete,no obvious abnormalities

## 2021-10-18 ENCOUNTER — Encounter: Payer: Self-pay | Admitting: Emergency Medicine

## 2021-10-18 ENCOUNTER — Other Ambulatory Visit: Payer: Self-pay | Admitting: Family Medicine

## 2021-10-18 ENCOUNTER — Ambulatory Visit
Admission: EM | Admit: 2021-10-18 | Discharge: 2021-10-18 | Disposition: A | Discharge status: Home / Self Care | Payer: BC Managed Care – PPO | Attending: Family Medicine | Admitting: Family Medicine

## 2021-10-18 DIAGNOSIS — Z79899 Other long term (current) drug therapy: Secondary | ICD-10-CM | POA: Insufficient documentation

## 2021-10-18 DIAGNOSIS — R059 Cough, unspecified: Secondary | ICD-10-CM | POA: Diagnosis not present

## 2021-10-18 DIAGNOSIS — Z1152 Encounter for screening for COVID-19: Secondary | ICD-10-CM | POA: Insufficient documentation

## 2021-10-18 DIAGNOSIS — J069 Acute upper respiratory infection, unspecified: Secondary | ICD-10-CM | POA: Diagnosis not present

## 2021-10-18 DIAGNOSIS — Z3492 Encounter for supervision of normal pregnancy, unspecified, second trimester: Secondary | ICD-10-CM | POA: Insufficient documentation

## 2021-10-18 DIAGNOSIS — R112 Nausea with vomiting, unspecified: Secondary | ICD-10-CM | POA: Diagnosis not present

## 2021-10-18 LAB — RESP PANEL BY RT-PCR (FLU A&B, COVID) ARPGX2
Influenza A by PCR: NEGATIVE
Influenza B by PCR: NEGATIVE
SARS Coronavirus 2 by RT PCR: NEGATIVE

## 2021-10-18 MED ORDER — ONDANSETRON 4 MG PO TBDP: 4.0000 mg | ORAL_TABLET | Freq: Three times a day (TID) | ORAL | 0 refills | Status: DC | PRN
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
## 2021-10-18 NOTE — ED Triage Notes (Signed)
Pt reports nasal congestion, chest congestion, headache and about 5 episodes of emesis. States symptoms started yesterday and worsened last night. Tried taking Tylenol for relief. Currently [redacted] weeks pregnant.

## 2021-10-18 NOTE — Discharge Instructions (Signed)
We have tested you today for COVID and flu and should have those results back tomorrow.  In the meantime, I have sent Zofran for your nausea and vomiting and you may try Flonase nasal spray twice daily, Sudafed, Delsym.  Make sure to stay well-hydrated and get lots of rest

## 2021-10-27 ENCOUNTER — Ambulatory Visit (INDEPENDENT_AMBULATORY_CARE_PROVIDER_SITE_OTHER): Payer: BC Managed Care – PPO | Admitting: Advanced Practice Midwife

## 2021-10-27 VITALS — BP 134/77 | HR 87 | Wt 166.0 lb

## 2021-10-27 DIAGNOSIS — Z3A23 23 weeks gestation of pregnancy: Secondary | ICD-10-CM

## 2021-10-27 DIAGNOSIS — Z3480 Encounter for supervision of other normal pregnancy, unspecified trimester: Secondary | ICD-10-CM

## 2021-10-27 MED ORDER — AZITHROMYCIN 250 MG PO TABS: ORAL_TABLET | ORAL | 0 refills | Status: DC

## 2021-10-27 NOTE — Progress Notes (Signed)
   LOW-RISK PREGNANCY VISIT Patient name: [name] Taylor MRN [id number]  Date of birth: Mar 20, 1985 Chief Complaint:   Routine Prenatal Visit  History of Present Illness:   [name] Taylor is a 36 y.o. [id number] female at [redacted]w[redacted]d with an Estimated Date of Delivery: 02/19/22 being seen today for ongoing management of a low-risk pregnancy.  Today she reports  approx 2wk hx of congestion and cough; she tested neg for Covid and flu on 10/16 and has been using supportive measures without improvement; now feels like a lot of sinus pressure . Contractions: Not present. Vag. Bleeding: None.  Movement: Present. denies leaking of fluid. Review of Systems:   Pertinent items are noted in HPI Denies abnormal vaginal discharge w/ itching/odor/irritation, headaches, visual changes, shortness of breath, chest pain, abdominal pain, severe nausea/vomiting, or problems with urination or bowel movements unless otherwise stated above. Pertinent History Reviewed:  Reviewed past medical,surgical, social, obstetrical and family history.  Reviewed problem list, medications and allergies. Physical Assessment:   Vitals:   10/27/21 1440  BP: 134/77  Pulse: 87  Weight: 166 lb (75.3 kg)  Body mass index is 31.37 kg/m.        Physical Examination:   General appearance: Well appearing, and in no distress  Mental status: Alert, oriented to person, place, and time  Skin: Warm & dry  Cardiovascular: Normal heart rate noted  Respiratory: Normal respiratory effort, no distress  Abdomen: Soft, gravid, nontender  Pelvic: Cervical exam deferred         Extremities: Edema: Trace  Fetal Status: Fetal Heart Rate (bpm): 146   Movement: Present    No results found for this or any previous visit (from the past 24 hour(s)).  Assessment & Plan:  1) Low-risk pregnancy [id number] at [redacted]w[redacted]d with an Estimated Date of Delivery: 02/19/22   2) URI, rx Z-pak; continue supportive measures including fluids  3) Hx 24wk delivery with full term  prior to and after; no s/s PTL   Meds:  Meds ordered this encounter  Medications   azithromycin (ZITHROMAX Z-PAK) 250 MG tablet    Sig: Take 500mg  on Day 1; 250mg  qd Days 2-5    Dispense:  6 each    Refill:  0    Order Specific Question:   Supervising Provider    Answer:   Janyth Pupa [[id number]]   Labs/procedures today: none  Plan:  Continue routine obstetrical care   Reviewed: Preterm labor symptoms and general obstetric precautions including but not limited to vaginal bleeding, contractions, leaking of fluid and fetal movement were reviewed in detail with the patient.  All questions were answered. Has home bp cuff. Check bp weekly, let us know if >140/90.   Follow-up: Return in about 4 weeks (around 11/24/2021) for LROB, PN2, in person.  No orders of the defined types were placed in this encounter.  Myrtis Ser CNM 10/27/2021 3:02 PM

## 2021-10-27 NOTE — Patient Instructions (Signed)
[name] Taylor, I greatly value your feedback.  If you receive a survey following your visit with [place] today, we appreciate you taking the time to fill it out.  Thanks, [name], CNM   You will have your sugar test next visit.  Please do not eat or drink anything after midnight the night before you come, not even water.  You will be here for at least two hours.  Please make an appointment online for the bloodwork at [email or web address] for 8:30am (or as close to this as possible). Make sure you select the [facility] service center. The day of the appointment, check in with our office first, then you will go to [place] to start the sugar test.    [place]!!! It is now [place] at [facility] ([place], [place] [address]) Entrance C, located off of [address] parking  Go to [email or web address] to register for FREE online childbirth classes   Call the office [phone]) or go to [facility] if: You begin to have strong, frequent contractions Your water breaks.  Sometimes it is a big gush of fluid, sometimes it is just a trickle that keeps getting your panties wet or running down your legs You have vaginal bleeding.  It is normal to have a small amount of spotting if your cervix was checked.  You don't feel your baby moving like normal.  If you don't, get you something to eat and drink and lay down and focus on feeling your baby move.   If your baby is still not moving like normal, you should call the office or go to [place] [facility]: [place] [phone]                [place] [phone] (usually not accepting new patients unless you have family there already, you are always welcome to call and ask)      [facility] Department [phone]       [facility] Pediatricians/Family Doctors:  [facility]: [phone] [facility]: [phone] [facility]: [place] Doctors:  [facility]: [place] [facility]: [place]: [place]: [phone]   Home Blood Pressure Monitoring for Patients   Your provider has recommended that you check your blood pressure (BP) at least once a week at home. If you do not have a blood pressure cuff at home, one will be provided for you. Contact your provider if you have not received your monitor within 1 week.   Helpful Tips for Accurate Home Blood Pressure Checks  Don't smoke, exercise, or drink caffeine 30 minutes before checking your BP Use the restroom before checking your BP (a full bladder can raise your pressure) Relax in a comfortable upright chair Feet on the ground Left arm resting comfortably on a flat surface at the level of your heart Legs uncrossed Back supported Sit quietly and don't talk Place the cuff on your bare arm Adjust snuggly, so that only two fingertips can fit between your skin and the top of the cuff Check 2 readings separated by at least one minute Keep a log of your BP readings For a visual, please reference this diagram: http://ccnc.care/bpdiagram  Provider Name: [facility]     Phone: [phone]  Zone 1: ALL CLEAR  Continue to monitor your symptoms:  BP reading is less than 140 (top number) or less than 90 (bottom number)  No right upper stomach pain No headaches or seeing spots No feeling nauseated or throwing up No swelling in face and hands  Zone 2: CAUTION Call your doctor's office for any of the following:  BP reading is greater than 140 (top number) or greater than 90 (bottom number)  Stomach pain under your ribs in the middle or right side Headaches or seeing spots Feeling nauseated or throwing up Swelling in face and hands  Zone 3: EMERGENCY  Seek  immediate medical care if you have any of the following:  BP reading is greater than160 (top number) or greater than 110 (bottom number) Severe headaches not improving with Tylenol Serious difficulty catching your breath Any worsening symptoms from Zone 2   Second Trimester of Pregnancy The second trimester is from week 13 through week 28, months 4 through 6. The second trimester is often a time when you feel your best. Your body has also adjusted to being pregnant, and you begin to feel better physically. Usually, morning sickness has lessened or quit completely, you may have more energy, and you may have an increase in appetite. The second trimester is also a time when the fetus is growing rapidly. At the end of the sixth month, the fetus is about 9 inches long and weighs about 1 pounds. You will likely begin to feel the baby move (quickening) between 18 and 20 weeks of the pregnancy. BODY CHANGES Your body goes through many changes during pregnancy. The changes vary from woman to woman.  Your weight will continue to increase. You will notice your lower abdomen bulging out. You may begin to get stretch marks on your hips, abdomen, and breasts. You may develop headaches that can be relieved by medicines approved by your health care provider. You may urinate more often because the fetus is pressing on your bladder. You may develop or continue to have heartburn as a result of your pregnancy. You may develop constipation because certain hormones are causing the muscles that push waste through your intestines to slow down. You may develop hemorrhoids or swollen, bulging veins (varicose veins). You may have back pain because of the weight gain and pregnancy hormones relaxing your joints between the bones in your pelvis and as a result of a shift in weight and the muscles that support your balance. Your breasts will continue to grow and be tender. Your gums may bleed and may be sensitive to brushing  and flossing. Dark spots or blotches (chloasma, mask of pregnancy) may develop on your face. This will likely fade after the baby is born. A dark line from your belly button to the pubic area (linea nigra) may appear. This will likely fade after the baby is born. You may have changes in your hair. These can include thickening of your hair, rapid growth, and changes in texture. Some women also have hair loss during or after pregnancy, or hair that feels dry or thin. Your hair will most likely return to normal after your baby is born. WHAT TO EXPECT AT YOUR PRENATAL VISITS During a routine prenatal visit: You will be weighed to make sure you and the fetus are growing normally. Your blood pressure will be taken. Your abdomen will be measured to track your baby's growth. The fetal heartbeat will be listened to. Any test results from the previous visit will be discussed. Your health care provider  may ask you: How you are feeling. If you are feeling the baby move. If you have had any abnormal symptoms, such as leaking fluid, bleeding, severe headaches, or abdominal cramping. If you have any questions. Other tests that may be performed during your second trimester include: Blood tests that check for: Low iron levels (anemia). Gestational diabetes (between 24 and 28 weeks). Rh antibodies. Urine tests to check for infections, diabetes, or protein in the urine. An ultrasound to confirm the proper growth and development of the baby. An amniocentesis to check for possible genetic problems. Fetal screens for spina bifida and Down syndrome. HOME CARE INSTRUCTIONS  Avoid all smoking, herbs, alcohol, and unprescribed drugs. These chemicals affect the formation and growth of the baby. Follow your health care provider's instructions regarding medicine use. There are medicines that are either safe or unsafe to take during pregnancy. Exercise only as directed by your health care provider. Experiencing  uterine cramps is a good sign to stop exercising. Continue to eat regular, healthy meals. Wear a good support bra for breast tenderness. Do not use hot tubs, steam rooms, or saunas. Wear your seat belt at all times when driving. Avoid raw meat, uncooked cheese, cat litter boxes, and soil used by cats. These carry germs that can cause birth defects in the baby. Take your prenatal vitamins. Try taking a stool softener (if your health care provider approves) if you develop constipation. Eat more high-fiber foods, such as fresh vegetables or fruit and whole grains. Drink plenty of fluids to keep your urine clear or pale yellow. Take warm sitz baths to soothe any pain or discomfort caused by hemorrhoids. Use hemorrhoid cream if your health care provider approves. If you develop varicose veins, wear support hose. Elevate your feet for 15 minutes, 3-4 times a day. Limit salt in your diet. Avoid heavy lifting, wear low heel shoes, and practice good posture. Rest with your legs elevated if you have leg cramps or low back pain. Visit your dentist if you have not gone yet during your pregnancy. Use a soft toothbrush to brush your teeth and be gentle when you floss. A sexual relationship may be continued unless your health care provider directs you otherwise. Continue to go to all your prenatal visits as directed by your health care provider. SEEK MEDICAL CARE IF:  You have dizziness. You have mild pelvic cramps, pelvic pressure, or nagging pain in the abdominal area. You have persistent nausea, vomiting, or diarrhea. You have a bad smelling vaginal discharge. You have pain with urination. SEEK IMMEDIATE MEDICAL CARE IF:  You have a fever. You are leaking fluid from your vagina. You have spotting or bleeding from your vagina. You have severe abdominal cramping or pain. You have rapid weight gain or loss. You have shortness of breath with chest pain. You notice sudden or extreme swelling of your face,  hands, ankles, feet, or legs. You have not felt your baby move in over an hour. You have severe headaches that do not go away with medicine. You have vision changes. Document Released: 12/14/2000 Document Revised: 12/25/2012 Document Reviewed: 02/21/2012 [facility] Patient Information 2015 [place], [place]. This information is not intended to replace advice given to you by your health care provider. Make sure you discuss any questions you have with your health care provider.

## 2021-11-23 ENCOUNTER — Encounter: Payer: Self-pay | Admitting: Obstetrics & Gynecology

## 2021-11-23 ENCOUNTER — Other Ambulatory Visit: Payer: BC Managed Care – PPO

## 2021-11-23 ENCOUNTER — Ambulatory Visit (INDEPENDENT_AMBULATORY_CARE_PROVIDER_SITE_OTHER): Payer: BC Managed Care – PPO | Admitting: Obstetrics & Gynecology

## 2021-11-23 VITALS — BP 123/67 | HR 82 | Wt 171.0 lb

## 2021-11-23 DIAGNOSIS — Z3480 Encounter for supervision of other normal pregnancy, unspecified trimester: Secondary | ICD-10-CM | POA: Diagnosis not present

## 2021-11-23 DIAGNOSIS — Z3A27 27 weeks gestation of pregnancy: Secondary | ICD-10-CM | POA: Diagnosis not present

## 2021-11-23 DIAGNOSIS — Z131 Encounter for screening for diabetes mellitus: Secondary | ICD-10-CM | POA: Diagnosis not present

## 2021-11-23 DIAGNOSIS — Z3482 Encounter for supervision of other normal pregnancy, second trimester: Secondary | ICD-10-CM | POA: Diagnosis not present

## 2021-11-23 NOTE — Progress Notes (Signed)
   LOW-RISK PREGNANCY VISIT Patient name: [name] Taylor MRN [id number]  Date of birth: 11-Dec-1985 Chief Complaint:   Routine Prenatal Visit  History of Present Illness:   [name] Taylor is a 36 y.o. [phone] female at [redacted]w[redacted]d with an Estimated Date of Delivery: 02/19/22 being seen today for ongoing management of a low-risk pregnancy.   -tobacco use- still trying to cut back     08/04/2021    2:29 PM 02/10/2020    2:08 PM 11/09/2017    8:53 AM  Depression screen PHQ 2/9  Decreased Interest 3 0 0  Down, Depressed, Hopeless 0 0 0  PHQ - 2 Score 3 0 0  Altered sleeping 2    Tired, decreased energy 3    Change in appetite 2    Feeling bad or failure about yourself  0    Trouble concentrating 0    Moving slowly or fidgety/restless 0    Suicidal thoughts 0    PHQ-9 Score 10      Today she reports no complaints. Contractions: Not present. Vag. Bleeding: None.  Movement: Present. denies leaking of fluid. Review of Systems:   Pertinent items are noted in HPI Denies abnormal vaginal discharge w/ itching/odor/irritation, headaches, visual changes, shortness of breath, chest pain, abdominal pain, severe nausea/vomiting, or problems with urination or bowel movements unless otherwise stated above. Pertinent History Reviewed:  Reviewed past medical,surgical, social, obstetrical and family history.  Reviewed problem list, medications and allergies.  Physical Assessment:   Vitals:   11/23/21 0905  BP: 123/67  Pulse: 82  Weight: 171 lb (77.6 kg)  Body mass index is 32.31 kg/m.        Physical Examination:   General appearance: Well appearing, and in no distress  Mental status: Alert, oriented to person, place, and time  Skin: Warm & dry  Respiratory: Normal respiratory effort, no distress  Abdomen: Soft, gravid, nontender  Pelvic: Cervical exam deferred         Extremities: Edema: Trace  Psych:  mood and affect appropriate  Fetal Status: Fetal Heart Rate (bpm): 140 Fundal Height: 28 cm  Movement: Present    Chaperone: n/a    No results found for this or any previous visit (from the past 24 hour(s)).   Assessment & Plan:  1) Low-risk pregnancy [id number] at [redacted]w[redacted]d with an Estimated Date of Delivery: 02/19/22   -reviewed Tdap, plans to do next visit -tobacco use- about the same   Meds: No orders of the defined types were placed in this encounter.  Labs/procedures today: PN2  Plan:  Continue routine obstetrical care  Next visit: prefers in person    Reviewed: Preterm labor symptoms and general obstetric precautions including but not limited to vaginal bleeding, contractions, leaking of fluid and fetal movement were reviewed in detail with the patient.  All questions were answered. Pt has home bp cuff. Check bp weekly, let us know if >140/90.   Follow-up: Return in about 3 weeks (around 12/14/2021) for LROB visit.  No orders of the defined types were placed in this encounter.   Myna Hidalgo, DO Attending Obstetrician & Gynecologist, [facility] for [employer], [facility]

## 2021-11-24 ENCOUNTER — Other Ambulatory Visit: Payer: Self-pay | Admitting: *Deleted

## 2021-11-24 ENCOUNTER — Encounter: Payer: Self-pay | Admitting: Obstetrics & Gynecology

## 2021-11-24 ENCOUNTER — Encounter: Payer: Self-pay | Admitting: *Deleted

## 2021-11-24 DIAGNOSIS — Z3480 Encounter for supervision of other normal pregnancy, unspecified trimester: Secondary | ICD-10-CM

## 2021-11-24 DIAGNOSIS — O24410 Gestational diabetes mellitus in pregnancy, diet controlled: Secondary | ICD-10-CM

## 2021-11-24 DIAGNOSIS — Z8632 Personal history of gestational diabetes: Secondary | ICD-10-CM | POA: Insufficient documentation

## 2021-11-24 DIAGNOSIS — O24419 Gestational diabetes mellitus in pregnancy, unspecified control: Secondary | ICD-10-CM | POA: Insufficient documentation

## 2021-11-24 LAB — CBC
Hematocrit: 35 % (ref 34.0–46.6)
Hemoglobin: 11.7 g/dL (ref 11.1–15.9)
MCH: 31.2 pg (ref 26.6–33.0)
MCHC: 33.4 g/dL (ref 31.5–35.7)
MCV: 93 fL (ref 79–97)
Platelets: 290 10*3/uL (ref 150–450)
RBC: 3.75 x10E6/uL — ABNORMAL LOW (ref 3.77–5.28)
RDW: 12.3 % (ref 11.7–15.4)
WBC: 15.7 10*3/uL — ABNORMAL HIGH (ref 3.4–10.8)

## 2021-11-24 LAB — GLUCOSE TOLERANCE, 2 HOURS W/ 1HR
Glucose, 1 hour: 198 mg/dL — ABNORMAL HIGH (ref 70–179)
Glucose, 2 hour: 140 mg/dL (ref 70–152)
Glucose, Fasting: 80 mg/dL (ref 70–91)

## 2021-11-24 LAB — HIV ANTIBODY (ROUTINE TESTING W REFLEX): HIV Screen 4th Generation wRfx: NONREACTIVE

## 2021-11-24 LAB — ANTIBODY SCREEN: Antibody Screen: NEGATIVE

## 2021-11-24 LAB — RPR: RPR Ser Ql: NONREACTIVE

## 2021-11-24 MED ORDER — ACCU-CHEK SOFTCLIX LANCETS MISC: 12 refills | Status: DC

## 2021-11-24 MED ORDER — ACCU-CHEK GUIDE VI STRP: ORAL_STRIP | 12 refills | Status: DC

## 2021-11-24 MED ORDER — ACCU-CHEK GUIDE ME W/DEVICE KIT: 1.0000 | PACK | Freq: Four times a day (QID) | 0 refills | Status: DC

## 2021-11-30 ENCOUNTER — Telehealth: Payer: Self-pay | Admitting: Family Medicine

## 2021-11-30 NOTE — Telephone Encounter (Signed)
Called patient to schedule education appointment. There was no answer to the phone call so a voicemail was left with the call back number for the office.

## 2021-12-01 ENCOUNTER — Other Ambulatory Visit: Payer: Self-pay | Admitting: Obstetrics & Gynecology

## 2021-12-01 DIAGNOSIS — O24410 Gestational diabetes mellitus in pregnancy, diet controlled: Secondary | ICD-10-CM

## 2021-12-02 ENCOUNTER — Encounter: Payer: Self-pay | Admitting: Obstetrics & Gynecology

## 2021-12-02 ENCOUNTER — Ambulatory Visit (INDEPENDENT_AMBULATORY_CARE_PROVIDER_SITE_OTHER): Payer: BC Managed Care – PPO

## 2021-12-02 ENCOUNTER — Ambulatory Visit (INDEPENDENT_AMBULATORY_CARE_PROVIDER_SITE_OTHER): Payer: BC Managed Care – PPO | Admitting: Obstetrics & Gynecology

## 2021-12-02 VITALS — BP 107/57 | HR 82 | Wt 173.2 lb

## 2021-12-02 DIAGNOSIS — O09523 Supervision of elderly multigravida, third trimester: Secondary | ICD-10-CM

## 2021-12-02 DIAGNOSIS — Z8751 Personal history of pre-term labor: Secondary | ICD-10-CM

## 2021-12-02 DIAGNOSIS — O0993 Supervision of high risk pregnancy, unspecified, third trimester: Secondary | ICD-10-CM

## 2021-12-02 DIAGNOSIS — O24410 Gestational diabetes mellitus in pregnancy, diet controlled: Secondary | ICD-10-CM | POA: Diagnosis not present

## 2021-12-02 DIAGNOSIS — Z3480 Encounter for supervision of other normal pregnancy, unspecified trimester: Secondary | ICD-10-CM

## 2021-12-02 DIAGNOSIS — Z3A28 28 weeks gestation of pregnancy: Secondary | ICD-10-CM

## 2021-12-02 DIAGNOSIS — Z23 Encounter for immunization: Secondary | ICD-10-CM | POA: Diagnosis not present

## 2021-12-02 LAB — POCT URINALYSIS DIPSTICK OB
Blood, UA: NEGATIVE
Glucose, UA: NEGATIVE
Ketones, UA: NEGATIVE
Nitrite, UA: NEGATIVE

## 2021-12-02 NOTE — Progress Notes (Signed)
HIGH-RISK PREGNANCY VISIT Patient name: [name] Taylor MRN [id number]  Date of birth: Mar 28, 1985 Chief Complaint:   Routine Prenatal Visit  History of Present Illness:   [name] Taylor is a 36 y.o. [id number] female at 52w5dwith an Estimated Date of Delivery: 02/19/22 being seen today for ongoing management of a high-risk pregnancy complicated by:  -newly diagnosed GDMA1- not yet seen nutritionist but has started to check sugars with machine.  Today she reports no complaints.   Contractions: Not present. Vag. Bleeding: None.  Movement: Present. denies leaking of fluid.      08/04/2021    2:29 PM 02/10/2020    2:08 PM 11/09/2017    8:53 AM  Depression screen PHQ 2/9  Decreased Interest 3 0 0  Down, Depressed, Hopeless 0 0 0  PHQ - 2 Score 3 0 0  Altered sleeping 2    Tired, decreased energy 3    Change in appetite 2    Feeling bad or failure about yourself  0    Trouble concentrating 0    Moving slowly or fidgety/restless 0    Suicidal thoughts 0    PHQ-9 Score 10       Current Outpatient Medications  Medication Instructions   Accu-Chek Softclix Lancets lancets Use as instructed to check blood sugar 4 times daily   acetaminophen (TYLENOL) 500 mg, Oral, Every 6 hours PRN   albuterol (VENTOLIN HFA) 108 (90 Base) MCG/ACT inhaler INHALE 2 PUFFS INTO THE LUNGS EVERY 6 HOURS AS NEEDED FOR WHEEZING   Blood Glucose Monitoring Suppl (ACCU-CHEK GUIDE ME) w/Device KIT 1 each, Does not apply, 4 times daily   calcium carbonate (TUMS EX) 750 MG chewable tablet 1 tablet, Oral, Daily   glucose blood (ACCU-CHEK GUIDE) test strip Use as instructed to check blood sugar 4 times daily   ondansetron (ZOFRAN-ODT) 4 mg, Oral, Every 8 hours PRN   Prenatal MV & Min w/FA-DHA (PRENATAL GUMMIES) 0.18-25 MG CHEW Oral     Review of Systems:   Pertinent items are noted in HPI Denies abnormal vaginal discharge w/ itching/odor/irritation, headaches, visual changes, shortness of breath, chest pain, abdominal  pain, severe nausea/vomiting, or problems with urination or bowel movements unless otherwise stated above. Pertinent History Reviewed:  Reviewed past medical,surgical, social, obstetrical and family history.  Reviewed problem list, medications and allergies. Physical Assessment:   Vitals:   12/02/21 1427  BP: (!) 107/57  Pulse: 82  Weight: 173 lb 4 oz (78.6 kg)  Body mass index is 32.74 kg/m.           Physical Examination:   General appearance: alert, well appearing, and in no distress  Mental status: normal mood, behavior, speech, dress, motor activity, and thought processes  Skin: warm & dry   Extremities: Edema: None    Cardiovascular: normal heart rate noted  Respiratory: normal respiratory effort, no distress  Abdomen: gravid, soft, non-tender  Pelvic: Cervical exam deferred         Fetal Status:     Movement: Present    Fetal Surveillance Testing today: cephalic,anterior placenta gr 0,AFI 22 cm,FHR 150 BPM,EFW 1441 g 74%,BPD 99%,HC 96%    Chaperone: N/A    Results for orders placed or performed in visit on 12/02/21 (from the past 24 hour(s))  POC Urinalysis Dipstick OB   Collection Time: 12/02/21  2:24 PM  Result Value Ref Range   Color, UA     Clarity, UA     Glucose, UA Negative Negative  Bilirubin, UA     Ketones, UA negative    Spec Grav, UA     Blood, UA negative    pH, UA     POC,PROTEIN,UA Small (1+) Negative, Trace, Small (1+), Moderate (2+), Large (3+), 4+   Urobilinogen, UA     Nitrite, UA negative    Leukocytes, UA Small (1+) (A) Negative   Appearance     Odor       Assessment & Plan:  High-risk pregnancy: [id number] at 62w5dwith an Estimated Date of Delivery: 02/19/22   1) GMDA1 -reviewed sugar goals and given sheet -plan for growth q 4wks  Meds: No orders of the defined types were placed in this encounter.   Labs/procedures today: growth scan  Treatment Plan:  routine OB care and as outlined above  Reviewed: Preterm labor symptoms and  general obstetric precautions including but not limited to vaginal bleeding, contractions, leaking of fluid and fetal movement were reviewed in detail with the patient.  All questions were answered. PT has home bp cuff. Check bp weekly, let u[place]oreaknow if >140/90.   Follow-up: No follow-ups on file.   Future Appointments  Date Time Provider [place] 12/09/2021 10:15 AM [facility] 12/14/2021  9:10 AM BRoma Schanz CNM [facility]    Orders Placed This Encounter  Procedures   POC Urinalysis Dipstick OB    JJanyth Pupa DO Attending OOpp[place] FSan [facility] Centerfor WDe[facility] Foods Company CBow[place]

## 2021-12-02 NOTE — Progress Notes (Addendum)
[place] 28+5 wks,cephalic,anterior placenta gr 0,AFI 22 cm,FHR 150 BPM,EFW 1441 g 74%,BPD 99%,HC 96%

## 2021-12-09 ENCOUNTER — Other Ambulatory Visit: Payer: BC Managed Care – PPO

## 2021-12-14 ENCOUNTER — Ambulatory Visit (INDEPENDENT_AMBULATORY_CARE_PROVIDER_SITE_OTHER): Payer: BC Managed Care – PPO | Admitting: Women's Health

## 2021-12-14 ENCOUNTER — Encounter: Payer: Self-pay | Admitting: Women's Health

## 2021-12-14 VITALS — BP 120/72 | HR 84 | Wt 171.0 lb

## 2021-12-14 DIAGNOSIS — O0993 Supervision of high risk pregnancy, unspecified, third trimester: Secondary | ICD-10-CM

## 2021-12-14 DIAGNOSIS — Z3A30 30 weeks gestation of pregnancy: Secondary | ICD-10-CM

## 2021-12-14 DIAGNOSIS — O24410 Gestational diabetes mellitus in pregnancy, diet controlled: Secondary | ICD-10-CM

## 2021-12-14 LAB — POCT URINALYSIS DIPSTICK OB
Blood, UA: NEGATIVE
Glucose, UA: NEGATIVE
Ketones, UA: NEGATIVE
Nitrite, UA: NEGATIVE
POC,PROTEIN,UA: NEGATIVE

## 2021-12-14 NOTE — Patient Instructions (Signed)
[name] Taylor, thank you for choosing our office today! We appreciate the opportunity to meet your healthcare needs. You may receive a short survey by mail, e-mail, or through [employer]. If you are happy with your care we would appreciate if you could take just a few minutes to complete the survey questions. We read all of your comments and take your feedback very seriously. Thank you again for choosing our office.  Center for [employer] Team at [facility]  [facility] at [facility] ([address] [place], [place] [address]) Entrance C, located off of [address] Free 24/7 valet parking   CLASSES: Go to [email or web address] to register for classes (childbirth, breastfeeding, waterbirth, infant CPR, daddy bootcamp, etc.)  Call the office [phone]) or go to [facility] if: You begin to have strong, frequent contractions Your water breaks.  Sometimes it is a big gush of fluid, sometimes it is just a trickle that keeps getting your panties wet or running down your legs You have vaginal bleeding.  It is normal to have a small amount of spotting if your cervix was checked.  You don't feel your baby moving like normal.  If you don't, get you something to eat and drink and lay down and focus on feeling your baby move.   If your baby is still not moving like normal, you should call the office or go to [facility].  Call the office [phone]) or go to [facility] hospital for these signs of pre-eclampsia: Severe headache that does not go away with Tylenol Visual changes- seeing spots, double, blurred vision Pain under your right breast or upper abdomen that does not go away with Tums or heartburn medicine Nausea and/or vomiting Severe swelling in your hands, feet, and face   Tdap Vaccine It is recommended that you get the Tdap vaccine during the third trimester of EACH pregnancy to help protect your baby from getting pertussis (whooping cough) 27-36 weeks is the BEST time to do  this so that you can pass the protection on to your baby. During pregnancy is better than after pregnancy, but if you are unable to get it during pregnancy it will be offered at the hospital.  You can get this vaccine with [place], at the health department, your family doctor, or some local pharmacies Everyone who will be around your baby should also be up-to-date on their vaccines before the baby comes. Adults (who are not pregnant) only need 1 dose of Tdap during adulthood.   [facility] Pediatricians/Family Doctors [facility]): [address] Colette Ribas, [phone]           [facility]: [address], [phone]                [facility]): [address], [phone] (call to ask if accepting patients) [facility] Department: [address], [place], [phone]    [facility] Pediatricians/Family Doctors [facility]): [phone], [phone] [facility]: [address] [place], [phone] [facility]: [address], [phone]  [facility] Doctors  [facility]): [phone] [facility]: [address], [phone]   [facility] Doctors [facility]: [address], [phone]  [facility] Family Doctors  [facility]: [phone], [phone]  Home Blood Pressure Monitoring for Patients   Your provider has recommended that you check your  blood pressure (BP) at least once a week at home. If you do not have a blood pressure cuff at home, one will be provided for you. Contact your provider if you have not received your monitor within 1 week.   Helpful Tips for Accurate Home Blood Pressure Checks  Don't smoke, exercise, or drink caffeine 30 minutes before checking your BP Use the restroom before checking your BP (a full bladder can raise your  pressure) Relax in a comfortable upright chair Feet on the ground Left arm resting comfortably on a flat surface at the level of your heart Legs uncrossed Back supported Sit quietly and don't talk Place the cuff on your bare arm Adjust snuggly, so that only two fingertips can fit between your skin and the top of the cuff Check 2 readings separated by at least one minute Keep a log of your BP readings For a visual, please reference this diagram: http://ccnc.care/bpdiagram  Provider Name: [facility]     Phone: [phone]  Zone 1: ALL CLEAR  Continue to monitor your symptoms:  BP reading is less than 140 (top number) or less than 90 (bottom number)  No right upper stomach pain No headaches or seeing spots No feeling nauseated or throwing up No swelling in face and hands  Zone 2: CAUTION Call your doctor's office for any of the following:  BP reading is greater than 140 (top number) or greater than 90 (bottom number)  Stomach pain under your ribs in the middle or right side Headaches or seeing spots Feeling nauseated or throwing up Swelling in face and hands  Zone 3: EMERGENCY  Seek immediate medical care if you have any of the following:  BP reading is greater than160 (top number) or greater than 110 (bottom number) Severe headaches not improving with Tylenol Serious difficulty catching your breath Any worsening symptoms from Zone 2   Third Trimester of Pregnancy The third trimester is from week 29 through week 42, months 7 through 9. The third trimester is a time when the fetus is growing rapidly. At the end of the ninth month, the fetus is about 20 inches in length and weighs 6-10 pounds.  BODY CHANGES Your body goes through many changes during pregnancy. The changes vary from woman to woman.  Your weight will continue to increase. You can expect to gain 25-35 pounds (11-16 kg) by the end of the pregnancy. You may begin to get stretch marks on your hips, abdomen,  and breasts. You may urinate more often because the fetus is moving lower into your pelvis and pressing on your bladder. You may develop or continue to have heartburn as a result of your pregnancy. You may develop constipation because certain hormones are causing the muscles that push waste through your intestines to slow down. You may develop hemorrhoids or swollen, bulging veins (varicose veins). You may have pelvic pain because of the weight gain and pregnancy hormones relaxing your joints between the bones in your pelvis. Backaches may result from overexertion of the muscles supporting your posture. You may have changes in your hair. These can include thickening of your hair, rapid growth, and changes in texture. Some women also have hair loss during or after pregnancy, or hair that feels dry or thin. Your hair will most likely return to normal after your baby is born. Your breasts will continue to grow and be tender. A yellow discharge may leak from your breasts called colostrum. Your belly button may stick out. You may   feel short of breath because of your expanding uterus. You may notice the fetus "dropping," or moving lower in your abdomen. You may have a bloody mucus discharge. This usually occurs a few days to a week before labor begins. Your cervix becomes thin and soft (effaced) near your due date. WHAT TO EXPECT AT YOUR PRENATAL EXAMS  You will have prenatal exams every 2 weeks until week 36. Then, you will have weekly prenatal exams. During a routine prenatal visit: You will be weighed to make sure you and the fetus are growing normally. Your blood pressure is taken. Your abdomen will be measured to track your baby's growth. The fetal heartbeat will be listened to. Any test results from the previous visit will be discussed. You may have a cervical check near your due date to see if you have effaced. At around 36 weeks, your caregiver will check your cervix. At the same time, your  caregiver will also perform a test on the secretions of the vaginal tissue. This test is to determine if a type of bacteria, Group B streptococcus, is present. Your caregiver will explain this further. Your caregiver may ask you: What your birth plan is. How you are feeling. If you are feeling the baby move. If you have had any abnormal symptoms, such as leaking fluid, bleeding, severe headaches, or abdominal cramping. If you have any questions. Other tests or screenings that may be performed during your third trimester include: Blood tests that check for low iron levels (anemia). Fetal testing to check the health, activity level, and growth of the fetus. Testing is done if you have certain medical conditions or if there are problems during the pregnancy. FALSE LABOR You may feel small, irregular contractions that eventually go away. These are called Braxton Hicks contractions, or false labor. Contractions may last for hours, days, or even weeks before true labor sets in. If contractions come at regular intervals, intensify, or become painful, it is best to be seen by your caregiver.  SIGNS OF LABOR  Menstrual-like cramps. Contractions that are 5 minutes apart or less. Contractions that start on the top of the uterus and spread down to the lower abdomen and back. A sense of increased pelvic pressure or back pain. A watery or bloody mucus discharge that comes from the vagina. If you have any of these signs before the 37th week of pregnancy, call your caregiver right away. You need to go to the hospital to get checked immediately. HOME CARE INSTRUCTIONS  Avoid all smoking, herbs, alcohol, and unprescribed drugs. These chemicals affect the formation and growth of the baby. Follow your caregiver's instructions regarding medicine use. There are medicines that are either safe or unsafe to take during pregnancy. Exercise only as directed by your caregiver. Experiencing uterine cramps is a good sign to  stop exercising. Continue to eat regular, healthy meals. Wear a good support bra for breast tenderness. Do not use hot tubs, steam rooms, or saunas. Wear your seat belt at all times when driving. Avoid raw meat, uncooked cheese, cat litter boxes, and soil used by cats. These carry germs that can cause birth defects in the baby. Take your prenatal vitamins. Try taking a stool softener (if your caregiver approves) if you develop constipation. Eat more high-fiber foods, such as fresh vegetables or fruit and whole grains. Drink plenty of fluids to keep your urine clear or pale yellow. Take warm sitz baths to soothe any pain or discomfort caused by hemorrhoids. Use hemorrhoid cream if   your caregiver approves. If you develop varicose veins, wear support hose. Elevate your feet for 15 minutes, 3-4 times a day. Limit salt in your diet. Avoid heavy lifting, wear low heal shoes, and practice good posture. Rest a lot with your legs elevated if you have leg cramps or low back pain. Visit your dentist if you have not gone during your pregnancy. Use a soft toothbrush to brush your teeth and be gentle when you floss. A sexual relationship may be continued unless your caregiver directs you otherwise. Do not travel far distances unless it is absolutely necessary and only with the approval of your caregiver. Take prenatal classes to understand, practice, and ask questions about the labor and delivery. Make a trial run to the hospital. Pack your hospital bag. Prepare the baby's nursery. Continue to go to all your prenatal visits as directed by your caregiver. SEEK MEDICAL CARE IF: You are unsure if you are in labor or if your water has broken. You have dizziness. You have mild pelvic cramps, pelvic pressure, or nagging pain in your abdominal area. You have persistent nausea, vomiting, or diarrhea. You have a bad smelling vaginal discharge. You have pain with urination. SEEK IMMEDIATE MEDICAL CARE IF:  You  have a fever. You are leaking fluid from your vagina. You have spotting or bleeding from your vagina. You have severe abdominal cramping or pain. You have rapid weight loss or gain. You have shortness of breath with chest pain. You notice sudden or extreme swelling of your face, hands, ankles, feet, or legs. You have not felt your baby move in over an hour. You have severe headaches that do not go away with medicine. You have vision changes. Document Released: 12/14/2000 Document Revised: 12/25/2012 Document Reviewed: 02/21/2012 ExitCare Patient Information 2015 [employer]. This information is not intended to replace advice given to you by your health care provider. Make sure you discuss any questions you have with your health care provider.       

## 2021-12-14 NOTE — Progress Notes (Signed)
HIGH-RISK PREGNANCY VISIT Patient name: [name] Taylor MRN [id number]  Date of birth: 10-11-1985 Chief Complaint:   Routine Prenatal Visit  History of Present Illness:   [name] Taylor is a 36 y.o. [id number] female at [redacted]w[redacted]d with an Estimated Date of Delivery: 02/19/22 being seen today for ongoing management of a high-risk pregnancy complicated by diabetes mellitus A1DM.    Today she reports  fbs 79-97 (only 2 >95), 2hr pp 73-140 (only 2 >120) . Contractions: Not present. Vag. Bleeding: None.  Movement: Present. denies leaking of fluid.      08/04/2021    2:29 PM 02/10/2020    2:08 PM 11/09/2017    8:53 AM  Depression screen PHQ 2/9  Decreased Interest 3 0 0  Down, Depressed, Hopeless 0 0 0  PHQ - 2 Score 3 0 0  Altered sleeping 2    Tired, decreased energy 3    Change in appetite 2    Feeling bad or failure about yourself  0    Trouble concentrating 0    Moving slowly or fidgety/restless 0    Suicidal thoughts 0    PHQ-9 Score 10          08/04/2021    2:29 PM  GAD 7 : Generalized Anxiety Score  Nervous, Anxious, on Edge 0  Control/stop worrying 0  Worry too much - different things 0  Trouble relaxing 0  Restless 0  Easily annoyed or irritable 0  Afraid - awful might happen 0  Total GAD 7 Score 0     Review of Systems:   Pertinent items are noted in HPI Denies abnormal vaginal discharge w/ itching/odor/irritation, headaches, visual changes, shortness of breath, chest pain, abdominal pain, severe nausea/vomiting, or problems with urination or bowel movements unless otherwise stated above. Pertinent History Reviewed:  Reviewed past medical,surgical, social, obstetrical and family history.  Reviewed problem list, medications and allergies. Physical Assessment:   Vitals:   12/14/21 0907  BP: 120/72  Pulse: 84  Weight: 171 lb (77.6 kg)  Body mass index is 32.31 kg/m.           Physical Examination:   General appearance: alert, well appearing, and in no distress  Mental  status: alert, oriented to person, place, and time  Skin: warm & dry   Extremities: Edema: Trace    Cardiovascular: normal heart rate noted  Respiratory: normal respiratory effort, no distress  Abdomen: gravid, soft, non-tender  Pelvic: Cervical exam deferred         Fetal Status: Fetal Heart Rate (bpm): 145 Fundal Height: 31 cm Movement: Present    Fetal Surveillance Testing today: doppler   Chaperone: N/A    Results for orders placed or performed in visit on 12/14/21 (from the past 24 hour(s))  POC Urinalysis Dipstick OB   Collection Time: 12/14/21  9:13 AM  Result Value Ref Range   Color, UA     Clarity, UA     Glucose, UA Negative Negative   Bilirubin, UA     Ketones, UA Negative    Spec Grav, UA     Blood, UA Negative    pH, UA     POC,PROTEIN,UA Negative Negative, Trace, Small (1+), Moderate (2+), Large (3+), 4+   Urobilinogen, UA     Nitrite, UA Negative    Leukocytes, UA Small (1+) (A) Negative   Appearance     Odor      Assessment & Plan:  High-risk pregnancy: [id number] at [redacted]w[redacted]d with an  Estimated Date of Delivery: 02/19/22   1) A1DM, stable, EFW 74% @ 28.5wk  2) H/O 24wk PTB, reviewed ptl s/s, reasons to seek care  Meds: No orders of the defined types were placed in this encounter.   Labs/procedures today: none  Treatment Plan:  ASA [x]    28, 32, 36wks    No antenatal testing as long as remains off meds     Deliver @ 39-40wks:____   Reviewed: Preterm labor symptoms and general obstetric precautions including but not limited to vaginal bleeding, contractions, leaking of fluid and fetal movement were reviewed in detail with the patient.  All questions were answered. Does have home bp cuff. Office bp cuff given: not applicable. Check bp weekly, let us know if consistently >140 and/or >90.  Follow-up: Return in about 2 weeks (around 12/28/2021) for HROB, US:EFW, MD or CNM, in person.   No future appointments.  Orders Placed This Encounter  Procedures   12/30/2021  OB Follow Up   POC Urinalysis Dipstick OB   US CNM, [facility] 12/14/2021 9:22 AM

## 2021-12-22 ENCOUNTER — Encounter: Payer: Self-pay | Admitting: Women's Health

## 2021-12-30 ENCOUNTER — Ambulatory Visit (INDEPENDENT_AMBULATORY_CARE_PROVIDER_SITE_OTHER): Payer: BC Managed Care – PPO

## 2021-12-30 ENCOUNTER — Ambulatory Visit (INDEPENDENT_AMBULATORY_CARE_PROVIDER_SITE_OTHER): Payer: BC Managed Care – PPO | Admitting: Obstetrics & Gynecology

## 2021-12-30 VITALS — BP 106/70 | HR 83 | Wt 176.0 lb

## 2021-12-30 DIAGNOSIS — O24410 Gestational diabetes mellitus in pregnancy, diet controlled: Secondary | ICD-10-CM

## 2021-12-30 DIAGNOSIS — O0993 Supervision of high risk pregnancy, unspecified, third trimester: Secondary | ICD-10-CM | POA: Diagnosis not present

## 2021-12-30 DIAGNOSIS — Z3A32 32 weeks gestation of pregnancy: Secondary | ICD-10-CM

## 2021-12-30 DIAGNOSIS — Z8751 Personal history of pre-term labor: Secondary | ICD-10-CM

## 2021-12-30 DIAGNOSIS — O09523 Supervision of elderly multigravida, third trimester: Secondary | ICD-10-CM

## 2021-12-30 LAB — POCT URINALYSIS DIPSTICK OB
Blood, UA: NEGATIVE
Glucose, UA: NEGATIVE
Ketones, UA: NEGATIVE
Nitrite, UA: NEGATIVE
POC,PROTEIN,UA: NEGATIVE

## 2021-12-30 NOTE — Progress Notes (Signed)
HIGH-RISK PREGNANCY VISIT Patient name: [name] Taylor MRN [id number]  Date of birth: April 07, 1985 Chief Complaint:   Routine Prenatal Visit  History of Present Illness:   [name] Taylor is a 36 y.o. [id number] female at [redacted]w[redacted]d with an Estimated Date of Delivery: 02/19/22 being seen today for ongoing management of a high-risk pregnancy complicated by diabetes mellitus A1DM. With overall good control(holidays a little higher)    Today she reports no complaints. Contractions: Irritability. Vag. Bleeding: None.  Movement: Present. denies leaking of fluid.      08/04/2021    2:29 PM 02/10/2020    2:08 PM 11/09/2017    8:53 AM  Depression screen PHQ 2/9  Decreased Interest 3 0 0  Down, Depressed, Hopeless 0 0 0  PHQ - 2 Score 3 0 0  Altered sleeping 2    Tired, decreased energy 3    Change in appetite 2    Feeling bad or failure about yourself  0    Trouble concentrating 0    Moving slowly or fidgety/restless 0    Suicidal thoughts 0    PHQ-9 Score 10          08/04/2021    2:29 PM  GAD 7 : Generalized Anxiety Score  Nervous, Anxious, on Edge 0  Control/stop worrying 0  Worry too much - different things 0  Trouble relaxing 0  Restless 0  Easily annoyed or irritable 0  Afraid - awful might happen 0  Total GAD 7 Score 0     Review of Systems:   Pertinent items are noted in HPI Denies abnormal vaginal discharge w/ itching/odor/irritation, headaches, visual changes, shortness of breath, chest pain, abdominal pain, severe nausea/vomiting, or problems with urination or bowel movements unless otherwise stated above. Pertinent History Reviewed:  Reviewed past medical,surgical, social, obstetrical and family history.  Reviewed problem list, medications and allergies. Physical Assessment:   Vitals:   12/30/21 0916  BP: 106/70  Pulse: 83  Weight: 176 lb (79.8 kg)  Body mass index is 33.25 kg/m.           Physical Examination:   General appearance: alert, well appearing, and in no  distress  Mental status: alert, oriented to person, place, and time  Skin: warm & dry   Extremities: Edema: None    Cardiovascular: normal heart rate noted  Respiratory: normal respiratory effort, no distress  Abdomen: gravid, soft, non-tender  Pelvic: Cervical exam deferred         Fetal Status:     Movement: Present    Fetal Surveillance Testing today: sonogram   Chaperone: N/A    Results for orders placed or performed in visit on 12/30/21 (from the past 24 hour(s))  POC Urinalysis Dipstick OB   Collection Time: 12/30/21  9:20 AM  Result Value Ref Range   Color, UA     Clarity, UA     Glucose, UA Negative Negative   Bilirubin, UA     Ketones, UA negative    Spec Grav, UA     Blood, UA negative    pH, UA     POC,PROTEIN,UA Negative Negative, Trace, Small (1+), Moderate (2+), Large (3+), 4+   Urobilinogen, UA     Nitrite, UA negative    Leukocytes, UA Small (1+) (A) Negative   Appearance     Odor      Assessment & Plan:  High-risk pregnancy: [id number] at [redacted]w[redacted]d with an Estimated Date of Delivery: 02/19/22  ICD-10-CM   1. Supervision of high risk pregnancy in third trimester  O09.93 POC Urinalysis Dipstick OB    2. Diet controlled gestational diabetes mellitus (GDM) in third trimester  O24.410 POC Urinalysis Dipstick OB   EFW 30% w/95% HC&85% AC        Meds: No orders of the defined types were placed in this encounter.   Orders:  Orders Placed This Encounter  Procedures   POC Urinalysis Dipstick OB     Labs/procedures today: U/S  Treatment Plan:  repeat EFW 4 weeks    Follow-up: Return in about 2 weeks (around 01/13/2022) for HROB.   No future appointments.  Orders Placed This Encounter  Procedures   POC Urinalysis Dipstick OB   Florian Buff  Attending Physician for the [facility] 12/30/2021 9:56 AM

## 2021-12-30 NOTE — Progress Notes (Signed)
[place] 32+5 wks,cephalic,anterior placenta gr 1,FHR 135 bpm,AFI 17.5 cm,EFW 1955 g 30%

## 2022-01-03 NOTE — L&D Delivery Note (Signed)
OB/GYN Faculty Practice Delivery Note  [name] Taylor is a 37 y.o. [phone] s/p SVD at [redacted]w[redacted]d. She was admitted for PROM.   ROM: 14h 32m with clear fluid GBS Status: Negative/-- (01/22 1100) Maximum Maternal Temperature: afebrile  Labor Progress: Initial SVE: 3.5/60/-2. She then progressed to complete.   Delivery Date/Time: 02/01/2022 0940  Delivery: Called to room and patient was complete and pushing. Head delivered LOA. No nuchal cord present. Shoulder and body delivered in usual fashion. Infant with spontaneous cry, placed on mother's abdomen, dried and stimulated. Cord clamped x 2 after 1-minute delay, and cut by FOB. Cord blood drawn. Placenta delivered spontaneously with gentle cord traction. Fundus firm with massage and Pitocin. Labia, perineum, vagina, and cervix inspected inspected with no laceration.  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

## 2022-01-13 ENCOUNTER — Ambulatory Visit (INDEPENDENT_AMBULATORY_CARE_PROVIDER_SITE_OTHER): Payer: BC Managed Care – PPO | Admitting: Obstetrics & Gynecology

## 2022-01-13 ENCOUNTER — Encounter: Payer: Self-pay | Admitting: Obstetrics & Gynecology

## 2022-01-13 VITALS — BP 135/70 | HR 71 | Wt 176.0 lb

## 2022-01-13 DIAGNOSIS — O24410 Gestational diabetes mellitus in pregnancy, diet controlled: Secondary | ICD-10-CM

## 2022-01-13 DIAGNOSIS — O0993 Supervision of high risk pregnancy, unspecified, third trimester: Secondary | ICD-10-CM

## 2022-01-13 DIAGNOSIS — Z3A34 34 weeks gestation of pregnancy: Secondary | ICD-10-CM

## 2022-01-13 NOTE — Progress Notes (Signed)
[place] PREGNANCY VISIT Patient name: [name] Taylor MRN [id number]  Date of birth: 02-Mar-1985 Chief Complaint:   Routine Prenatal Visit  History of Present Illness:   [name] Taylor is a 37 y.o. [id number] female at [redacted]w[redacted]d with an Estimated Date of Delivery: 02/19/22 being seen today for ongoing management of a high-risk pregnancy complicated by:  GDMA1- well controlled  Today she reports backache.   Contractions: Not present. Vag. Bleeding: None.  Movement: Present. denies leaking of fluid.      08/04/2021    2:29 PM 02/10/2020    2:08 PM 11/09/2017    8:53 AM  Depression screen PHQ 2/9  Decreased Interest 3 0 0  Down, Depressed, Hopeless 0 0 0  PHQ - 2 Score 3 0 0  Altered sleeping 2    Tired, decreased energy 3    Change in appetite 2    Feeling bad or failure about yourself  0    Trouble concentrating 0    Moving slowly or fidgety/restless 0    Suicidal thoughts 0    PHQ-9 Score 10       Current Outpatient Medications  Medication Instructions   Accu-Chek Softclix Lancets lancets Use as instructed to check blood sugar 4 times daily   acetaminophen (TYLENOL) 500 mg, Oral, Every 6 hours PRN   albuterol (VENTOLIN HFA) 108 (90 Base) MCG/ACT inhaler INHALE 2 PUFFS INTO THE LUNGS EVERY 6 HOURS AS NEEDED FOR WHEEZING   Blood Glucose Monitoring Suppl (ACCU-CHEK GUIDE ME) w/Device KIT 1 each, Does not apply, 4 times daily   calcium carbonate (TUMS EX) 750 MG chewable tablet 1 tablet, Oral, Daily   glucose blood (ACCU-CHEK GUIDE) test strip Use as instructed to check blood sugar 4 times daily   ondansetron (ZOFRAN-ODT) 4 mg, Oral, Every 8 hours PRN   Prenatal MV & Min w/FA-DHA (PRENATAL GUMMIES) 0.18-25 MG CHEW Oral     Review of Systems:   Pertinent items are noted in HPI Denies abnormal vaginal discharge w/ itching/odor/irritation, headaches, visual changes, shortness of breath, chest pain, abdominal pain, severe nausea/vomiting, or problems with urination or bowel movements  unless otherwise stated above. Pertinent History Reviewed:  Reviewed past medical,surgical, social, obstetrical and family history.  Reviewed problem list, medications and allergies. Physical Assessment:   Vitals:   01/13/22 1123  BP: 135/70  Pulse: 71  Weight: 176 lb (79.8 kg)  Body mass index is 33.25 kg/m.           Physical Examination:   General appearance: alert, well appearing, and in no distress  Mental status: normal mood, behavior, speech, dress, motor activity, and thought processes  Skin: warm & dry   Extremities: Edema: None    Cardiovascular: normal heart rate noted  Respiratory: normal respiratory effort, no distress  Abdomen: gravid, soft, non-tender  Pelvic: Cervical exam deferred         Fetal Status: Fetal Heart Rate (bpm): 140 Fundal Height: 35 cm Movement: Present    Fetal Surveillance Testing today: doppler   Chaperone: N/A    No results found for this or any previous visit (from the past 24 hour(s)).   Assessment & Plan:  High-risk pregnancy: [id number] at [redacted]w[redacted]d with an Estimated Date of Delivery: 02/19/22   1) GDMA1 -well controlled -continue growth q 4wks, plan to schedule @ 38wks -reviewed IOL 39-40wk  -tobacco use  Meds: No orders of the defined types were placed in this encounter.   Labs/procedures today: none  Treatment Plan:  routine  ob care, []  GBS next visit  Reviewed: Preterm labor symptoms and general obstetric precautions including but not limited to vaginal bleeding, contractions, leaking of fluid and fetal movement were reviewed in detail with the patient.  All questions were answered. Pt has home bp cuff. Check bp weekly, let us know if >140/90.   Follow-up: Return in about 1 week (around 01/20/2022) for [place] visit (ok for CNM) and growth in 3wks.   Janyth Pupa, DO Attending [place], [facility] for [employer], [place]

## 2022-01-18 ENCOUNTER — Encounter: Payer: Self-pay | Admitting: Women's Health

## 2022-01-18 ENCOUNTER — Inpatient Hospital Stay (HOSPITAL_COMMUNITY)
Admission: AD | Admit: 2022-01-18 | Discharge: 2022-01-18 | Disposition: A | Discharge status: Home / Self Care | Payer: BC Managed Care – PPO | Attending: Family Medicine | Admitting: Family Medicine

## 2022-01-18 ENCOUNTER — Inpatient Hospital Stay (HOSPITAL_BASED_OUTPATIENT_CLINIC_OR_DEPARTMENT_OTHER): Payer: BC Managed Care – PPO

## 2022-01-18 ENCOUNTER — Encounter: Payer: Self-pay | Admitting: *Deleted

## 2022-01-18 ENCOUNTER — Encounter (HOSPITAL_COMMUNITY): Payer: Self-pay | Admitting: Family Medicine

## 2022-01-18 ENCOUNTER — Ambulatory Visit (INDEPENDENT_AMBULATORY_CARE_PROVIDER_SITE_OTHER): Payer: BC Managed Care – PPO | Admitting: Women's Health

## 2022-01-18 VITALS — BP 147/81 | HR 85 | Wt 170.5 lb

## 2022-01-18 DIAGNOSIS — O24419 Gestational diabetes mellitus in pregnancy, unspecified control: Secondary | ICD-10-CM | POA: Diagnosis not present

## 2022-01-18 DIAGNOSIS — O0993 Supervision of high risk pregnancy, unspecified, third trimester: Secondary | ICD-10-CM

## 2022-01-18 DIAGNOSIS — R030 Elevated blood-pressure reading, without diagnosis of hypertension: Secondary | ICD-10-CM

## 2022-01-18 DIAGNOSIS — O113 Pre-existing hypertension with pre-eclampsia, third trimester: Secondary | ICD-10-CM | POA: Diagnosis not present

## 2022-01-18 DIAGNOSIS — Z3A35 35 weeks gestation of pregnancy: Secondary | ICD-10-CM | POA: Diagnosis not present

## 2022-01-18 DIAGNOSIS — O26893 Other specified pregnancy related conditions, third trimester: Secondary | ICD-10-CM

## 2022-01-18 DIAGNOSIS — O368130 Decreased fetal movements, third trimester, not applicable or unspecified: Secondary | ICD-10-CM | POA: Insufficient documentation

## 2022-01-18 DIAGNOSIS — O163 Unspecified maternal hypertension, third trimester: Secondary | ICD-10-CM

## 2022-01-18 DIAGNOSIS — R519 Headache, unspecified: Secondary | ICD-10-CM

## 2022-01-18 DIAGNOSIS — O368190 Decreased fetal movements, unspecified trimester, not applicable or unspecified: Secondary | ICD-10-CM

## 2022-01-18 DIAGNOSIS — O09523 Supervision of elderly multigravida, third trimester: Secondary | ICD-10-CM | POA: Insufficient documentation

## 2022-01-18 DIAGNOSIS — O99353 Diseases of the nervous system complicating pregnancy, third trimester: Secondary | ICD-10-CM | POA: Insufficient documentation

## 2022-01-18 DIAGNOSIS — Z8751 Personal history of pre-term labor: Secondary | ICD-10-CM

## 2022-01-18 DIAGNOSIS — O10913 Unspecified pre-existing hypertension complicating pregnancy, third trimester: Secondary | ICD-10-CM | POA: Diagnosis not present

## 2022-01-18 DIAGNOSIS — N898 Other specified noninflammatory disorders of vagina: Secondary | ICD-10-CM | POA: Diagnosis not present

## 2022-01-18 DIAGNOSIS — O09521 Supervision of elderly multigravida, first trimester: Secondary | ICD-10-CM

## 2022-01-18 DIAGNOSIS — O24410 Gestational diabetes mellitus in pregnancy, diet controlled: Secondary | ICD-10-CM

## 2022-01-18 LAB — POCT URINALYSIS DIPSTICK OB
Glucose, UA: NEGATIVE
Nitrite, UA: NEGATIVE

## 2022-01-18 LAB — CBC
HCT: 32.1 % — ABNORMAL LOW (ref 36.0–46.0)
Hemoglobin: 11.2 g/dL — ABNORMAL LOW (ref 12.0–15.0)
MCH: 31.6 pg (ref 26.0–34.0)
MCHC: 34.9 g/dL (ref 30.0–36.0)
MCV: 90.7 fL (ref 80.0–100.0)
Platelets: 247 10*3/uL (ref 150–400)
RBC: 3.54 MIL/uL — ABNORMAL LOW (ref 3.87–5.11)
RDW: 13.3 % (ref 11.5–15.5)
WBC: 15.8 10*3/uL — ABNORMAL HIGH (ref 4.0–10.5)
nRBC: 0 % (ref 0.0–0.2)

## 2022-01-18 LAB — POCT WET PREP (WET MOUNT): Trichomonas Wet Prep HPF POC: ABSENT

## 2022-01-18 LAB — RAPID HIV SCREEN (HIV 1/2 AB+AG)
HIV 1/2 Antibodies: NONREACTIVE
HIV-1 P24 Antigen - HIV24: NONREACTIVE

## 2022-01-18 LAB — COMPREHENSIVE METABOLIC PANEL
ALT: 12 U/L (ref 0–44)
AST: 15 U/L (ref 15–41)
Albumin: 3.2 g/dL — ABNORMAL LOW (ref 3.5–5.0)
Alkaline Phosphatase: 80 U/L (ref 38–126)
Anion gap: 12 (ref 5–15)
BUN: <5 mg/dL — ABNORMAL LOW (ref 6–20)
CO2: 19 mmol/L — ABNORMAL LOW (ref 22–32)
Calcium: 8.7 mg/dL — ABNORMAL LOW (ref 8.9–10.3)
Chloride: 105 mmol/L (ref 98–111)
Creatinine, Ser: 0.58 mg/dL (ref 0.44–1.00)
GFR, Estimated: >60 mL/min (ref >60)
Glucose, Bld: 91 mg/dL (ref 70–99)
Potassium: 3.1 mmol/L — ABNORMAL LOW (ref 3.5–5.1)
Sodium: 136 mmol/L (ref 135–145)
Total Bilirubin: 0.4 mg/dL (ref 0.3–1.2)
Total Protein: 6.5 g/dL (ref 6.5–8.1)

## 2022-01-18 LAB — TYPE AND SCREEN
ABO/RH(D): O POS
Antibody Screen: NEGATIVE

## 2022-01-18 LAB — PROTEIN / CREATININE RATIO, URINE
Creatinine, Urine: 189 mg/dL
Protein Creatinine Ratio: 0.24 mg/mg{Cre} — ABNORMAL HIGH (ref 0.00–0.15)
Total Protein, Urine: 45 mg/dL

## 2022-01-18 MED ORDER — CYCLOBENZAPRINE HCL 5 MG PO TABS
5.0000 mg | ORAL_TABLET | Freq: Once | ORAL | Status: AC
  Administered 2022-01-18: 5 mg via ORAL
  Filled 2022-01-18: qty 1

## 2022-01-18 MED ORDER — CYCLOBENZAPRINE HCL 10 MG PO TABS: 10.0000 mg | ORAL_TABLET | Freq: Two times a day (BID) | ORAL | 0 refills | Status: DC | PRN

## 2022-01-18 MED ORDER — ONDANSETRON 4 MG PO TBDP: 4.0000 mg | ORAL_TABLET | Freq: Three times a day (TID) | ORAL | 1 refills | Status: DC | PRN

## 2022-01-18 MED ORDER — METOCLOPRAMIDE HCL 5 MG/ML IJ SOLN
10.0000 mg | Freq: Once | INTRAMUSCULAR | Status: AC
  Administered 2022-01-18: 10 mg via INTRAVENOUS
  Filled 2022-01-18: qty 2

## 2022-01-18 MED ORDER — LACTATED RINGERS IV BOLUS
1000.0000 mL | Freq: Once | INTRAVENOUS | Status: AC
  Administered 2022-01-18: 1000 mL via INTRAVENOUS

## 2022-01-18 MED ORDER — ACETAMINOPHEN-CAFFEINE 500-65 MG PO TABS
2.0000 | ORAL_TABLET | Freq: Once | ORAL | Status: AC
  Administered 2022-01-18: 2 via ORAL
  Filled 2022-01-18: qty 2

## 2022-01-18 NOTE — MAU Note (Signed)
.  [name] Taylor is a 37 y.o. at [redacted]w[redacted]d here in MAU reporting: sent from office for elevated b/p and headache without relief from tylenol. . C/o decreased fetal movement and seeing some spots. Had reported some milky discharge. R/o rupture at office. C/o mild to moderate ctxs  3-4 times/hr.  LMP:  Onset of complaint: yesterday.  Pain score: 7 ctx/ 8/HA Vitals:   01/18/22 1344  BP: (!) 144/64  Pulse: 67  Resp: 18  Temp: 98.5 F (36.9 C)     FHT:131 Lab orders placed from triage:
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
## 2022-01-18 NOTE — Progress Notes (Signed)
Work-in HIGH-RISK PREGNANCY VISIT Patient name: [name] Taylor MRN [id number]  Date of birth: August 25, 1985 Chief Complaint:   High Risk Gestation (Headache, elevated BP @ home, watery discharge, + pressure; hasn't felt baby move today)  History of Present Illness:   [name] Taylor is a 37 y.o. [phone] female at [redacted]w[redacted]d with an Estimated Date of Delivery: 02/19/22 being seen today for ongoing management of a high-risk pregnancy complicated by diabetes mellitus A1DM.    Today she is being seen as a work-in for report of  elevated bp's at home (SBPs 150s-180s), bad headache since yesterday unrelieved by 1gm apap, rates 8/10, took apap at 0730, seeing spots intermittently since yesterday. Denies RUQ/epigastric pain. Also reports no fm today, pressure and low back pain, white d/c w/ wetness in underwear since yesterday. No big gush/leaking down legs. Denies itching/odor/irritation. No vb. Does hae A1DM, reports all sugars wnl. . Contractions: Irritability. Vag. Bleeding: None.  Movement: (!) Decreased. denies leaking of fluid.      08/04/2021    2:29 PM 02/10/2020    2:08 PM 11/09/2017    8:53 AM  Depression screen PHQ 2/9  Decreased Interest 3 0 0  Down, Depressed, Hopeless 0 0 0  PHQ - 2 Score 3 0 0  Altered sleeping 2    Tired, decreased energy 3    Change in appetite 2    Feeling bad or failure about yourself  0    Trouble concentrating 0    Moving slowly or fidgety/restless 0    Suicidal thoughts 0    PHQ-9 Score 10          08/04/2021    2:29 PM  GAD 7 : Generalized Anxiety Score  Nervous, Anxious, on Edge 0  Control/stop worrying 0  Worry too much - different things 0  Trouble relaxing 0  Restless 0  Easily annoyed or irritable 0  Afraid - awful might happen 0  Total GAD 7 Score 0     Review of Systems:   Pertinent items are noted in HPI Denies abnormal vaginal discharge w/ itching/odor/irritation, headaches, visual changes, shortness of breath, chest pain, abdominal pain, severe  nausea/vomiting, or problems with urination or bowel movements unless otherwise stated above. Pertinent History Reviewed:  Reviewed past medical,surgical, social, obstetrical and family history.  Reviewed problem list, medications and allergies. Physical Assessment:   Vitals:   01/18/22 1031 01/18/22 1036  BP: (!) 146/88 (!) 147/81  Pulse: 96 85  Weight: 170 lb 8 oz (77.3 kg)   Body mass index is 32.22 kg/m.           Physical Examination:   General appearance: alert, well appearing, and in no distress  Mental status: alert, oriented to person, place, and time  Skin: warm & dry   Extremities:   tr, 2+ DTRs, no clonus   Cardiovascular: normal heart rate noted  Respiratory: normal respiratory effort, no distress  Abdomen: gravid, soft, non-tender  Pelvic:  SSE: cx visually closed, no pooling, no change w/ valsalva (fern neg), small amt yellow d/c, wet prep many wbc's and +clues          Fetal Status:     Movement: (!) Decreased    Fetal Surveillance Testing today: NST: FHR baseline 135 bpm, Variability: moderate, Accelerations:present, Decelerations:  Absent= Cat 1/reactive Toco: irregular q 2-43mins, not uncomfortable    Chaperone: Levy Pupa    Results for orders placed or performed in visit on 01/18/22 (from the past 24 hour(s))  POC Urinalysis Dipstick OB   Collection Time: 01/18/22 10:34 AM  Result Value Ref Range   Color, UA     Clarity, UA     Glucose, UA Negative Negative   Bilirubin, UA     Ketones, UA trace    Spec Grav, UA     Blood, UA trace    pH, UA     POC,PROTEIN,UA Small (1+) Negative, Trace, Small (1+), Moderate (2+), Large (3+), 4+   Urobilinogen, UA     Nitrite, UA neg    Leukocytes, UA Large (3+) (A) Negative   Appearance     Odor    POCT Wet Prep [facility])   Collection Time: 01/18/22 12:32 PM  Result Value Ref Range   Source Wet Prep POC vaginal    WBC, Wet Prep HPF POC many    Bacteria Wet Prep HPF POC Few Few   BACTERIA WET PREP  MORPHOLOGY POC     Clue Cells Wet Prep HPF POC Moderate (A) None   Clue Cells Wet Prep Whiff POC     Yeast Wet Prep HPF POC None None   KOH Wet Prep POC     Trichomonas Wet Prep HPF POC Absent Absent    Assessment & Plan:  High-risk pregnancy: [id number] at [redacted]w[redacted]d with an Estimated Date of Delivery: 02/19/22   1) Concern for pre-e w/ severe features, d/t severe range bp's at home, severe headache unrelieved by apap, seeing spots intermittently. Non-severe range bp's here, 1+ proteinuria. No fm since yesterday, Cat 1/reactive NST- audible fetal movement, not felt by pt. Irregular contractions. +clues on wet prep. No evidence of ROM. Called Dr. Nehemiah Settle- in [place], to call me back. Waited ~1hr, called Dr. Rip Harbour (2nd call), wants her to go through MAU for labs/eval. Notified MAU providers.   2) A1DM> stable, EFW 30% @ 32.5wk  3) H/O 24wk PTB Meds: No orders of the defined types were placed in this encounter.   Labs/procedures today: spec exam, NST, and wet prep, fern  Treatment Plan:  to MAU for eval  Follow-up: Return for As scheduled Thursday if d/c'd.   Future Appointments  Date Time Provider [place]  01/20/2022 10:30 AM Christin Fudge, CNM [facility]  02/01/2022 10:30 AM [facility] [place] [facility] None  02/01/2022 11:50 AM E[place], Mertie Clause, MD [facility]    Orders Placed This Encounter  Procedures   POC Urinalysis Dipstick OB   POCT Wet Prep [facility])   Roma Schanz [place], [facility] 01/18/2022 12:33 PM

## 2022-01-18 NOTE — MAU Note (Signed)
  RN called lab to inquire about blood work sent down. Rhea answered and reported that she had them and would process now.

## 2022-01-19 LAB — RPR: RPR Ser Ql: NONREACTIVE

## 2022-01-20 ENCOUNTER — Encounter: Payer: Self-pay | Admitting: Advanced Practice Midwife

## 2022-01-20 ENCOUNTER — Ambulatory Visit (INDEPENDENT_AMBULATORY_CARE_PROVIDER_SITE_OTHER): Payer: BC Managed Care – PPO | Admitting: Advanced Practice Midwife

## 2022-01-20 VITALS — BP 118/75 | HR 90 | Wt 168.0 lb

## 2022-01-20 DIAGNOSIS — O0993 Supervision of high risk pregnancy, unspecified, third trimester: Secondary | ICD-10-CM

## 2022-01-20 DIAGNOSIS — Z3A35 35 weeks gestation of pregnancy: Secondary | ICD-10-CM | POA: Diagnosis not present

## 2022-01-20 DIAGNOSIS — O24410 Gestational diabetes mellitus in pregnancy, diet controlled: Secondary | ICD-10-CM | POA: Diagnosis not present

## 2022-01-20 DIAGNOSIS — O1213 Gestational proteinuria, third trimester: Secondary | ICD-10-CM | POA: Diagnosis not present

## 2022-01-20 DIAGNOSIS — O139 Gestational [pregnancy-induced] hypertension without significant proteinuria, unspecified trimester: Secondary | ICD-10-CM

## 2022-01-20 DIAGNOSIS — O133 Gestational [pregnancy-induced] hypertension without significant proteinuria, third trimester: Secondary | ICD-10-CM | POA: Diagnosis not present

## 2022-01-20 HISTORY — DX: Gestational (pregnancy-induced) hypertension without significant proteinuria, unspecified trimester: O13.9

## 2022-01-20 LAB — POCT URINALYSIS DIPSTICK OB
Blood, UA: NEGATIVE
Glucose, UA: NEGATIVE
Ketones, UA: NEGATIVE
Nitrite, UA: NEGATIVE

## 2022-01-20 NOTE — Progress Notes (Addendum)
HIGH-RISK PREGNANCY VISIT Patient name: [name] Taylor MRN [id number]  Date of birth: [birth year] Chief Complaint:   High Risk Gestation (BP goes from high to low)  History of Present Illness:   [name] Taylor is a 37 y.o. [phone] female at [redacted]w[redacted]d with an Estimated Date of Delivery: 02/19/22 being seen today for ongoing management of a high-risk pregnancy complicated by diabetes mellitus A1DM.  152/91, 150/86 144/86 at home.    Today she reports FBS <95 and 2 hr pp <120  Had ^ BP and HA 1/16, sent to MFM.  BPP 8/8, BPs normal.  BPs at home have been labile, but 3 above threshold. Contractions: Irritability. Vag. Bleeding: None.  Movement: Present. denies leaking of fluid.      08/04/2021    2:29 PM 02/10/2020    2:08 PM 11/09/2017    8:53 AM  Depression screen PHQ 2/9  Decreased Interest 3 0 0  Down, Depressed, Hopeless 0 0 0  PHQ - 2 Score 3 0 0  Altered sleeping 2    Tired, decreased energy 3    Change in appetite 2    Feeling bad or failure about yourself  0    Trouble concentrating 0    Moving slowly or fidgety/restless 0    Suicidal thoughts 0    PHQ-9 Score 10          08/04/2021    2:29 PM  GAD 7 : Generalized Anxiety Score  Nervous, Anxious, on Edge 0  Control/stop worrying 0  Worry too much - different things 0  Trouble relaxing 0  Restless 0  Easily annoyed or irritable 0  Afraid - awful might happen 0  Total GAD 7 Score 0     Review of Systems:   Pertinent items are noted in HPI Denies abnormal vaginal discharge w/ itching/odor/irritation, headaches, visual changes, shortness of breath, chest pain, abdominal pain, severe nausea/vomiting, or problems with urination or bowel movements unless otherwise stated above. Pertinent History Reviewed:  Reviewed past medical,surgical, social, obstetrical and family history.  Reviewed problem list, medications and allergies. Physical Assessment:   Vitals:   01/20/22 1028  BP: 118/75  Pulse: 90  Weight: 168 lb (76.2 kg)   Body mass index is 31.74 kg/m.           Physical Examination:   General appearance: alert, well appearing, and in no distress  Mental status: alert, oriented to person, place, and time  Skin: warm & dry   Extremities:      Cardiovascular: normal heart rate noted  Respiratory: normal respiratory effort, no distress  Abdomen: gravid, soft, non-tender  Pelvic: Cervical exam deferred         Fetal Status: Fetal Heart Rate (bpm): 135 Fundal Height: 35 cm Movement: Present    Fetal Surveillance Testing today: NST: FHR baseline 135 bpm, Variability: moderate, Accelerations:present, Decelerations:  Absent= Cat 1/Reactive    Chaperone: N/A    Results for orders placed or performed in visit on 01/20/22 (from the past 24 hour(s))  POC Urinalysis Dipstick OB   Collection Time: 01/20/22 10:32 AM  Result Value Ref Range   Color, UA     Clarity, UA     Glucose, UA Negative Negative   Bilirubin, UA     Ketones, UA neg    Spec Grav, UA     Blood, UA neg    pH, UA     POC,PROTEIN,UA Moderate (2+) Negative, Trace, Small (1+), Moderate (2+), Large (3+),  4+   Urobilinogen, UA     Nitrite, UA neg    Leukocytes, UA Large (3+) (A) Negative   Appearance     Odor      Assessment & Plan:  High-risk pregnancy: [id number] at [redacted]w[redacted]d with an Estimated Date of Delivery: 02/19/22      ICD-10-CM   1. Supervision of high risk pregnancy in third trimester  O09.93 POC Urinalysis Dipstick OB    2. [redacted] weeks gestation of pregnancy  Z3A.35 POC Urinalysis Dipstick OB    3. Proteinuria affecting pregnancy in third trimester  O12.13 Urine Culture    Protein / creatinine ratio, urine   urine cx and pr/cr ratio ordered    4. Gestational hypertension, third trimester  O13.3    Home BPs ^ (brought cuff, confirmed accuracy). BPs labile, twice weekly testing and IOL 37-37.6 weeks or as clinically indicated    5. Diet controlled gestational diabetes mellitus (GDM) in third trimester  O24.410    all BS normal         Meds: No orders of the defined types were placed in this encounter.   Orders:  Orders Placed This Encounter  Procedures   Urine Culture   Protein / creatinine ratio, urine   POC Urinalysis Dipstick OB     Labs/procedures today: UA/urine cx, protein/creatine ratio   Reviewed: Preterm labor symptoms and general obstetric precautions including but not limited to vaginal bleeding, contractions, leaking of fluid and fetal movement were reviewed in detail with the patient.  All questions were answered. Does have home bp cuff. Office bp cuff given: not applicable. Check bp daily, let us know if consistently >150 and/or >95.  Follow-up: Return for start twice weekly (Mondays, HROB/NST) Thursdays (NST w/RN) Keep 1/30 US/HROB.   Future Appointments  Date Time Provider [place]  01/24/2022  9:50 AM [facility] NURSE [facility]  01/24/2022 10:10 AM Christin Fudge, CNM [facility]  01/27/2022 11:30 AM [facility] NURSE [facility]  02/01/2022 10:30 AM [facility] [place] [facility] None  02/01/2022 11:50 AM Florian Buff, MD [facility]  02/04/2022 10:50 AM [facility] NURSE [facility]    Orders Placed This Encounter  Procedures   Urine Culture   Protein / creatinine ratio, urine   POC Urinalysis Dipstick OB   Christin Fudge , DNP, [facility] 01/20/2022 12:22 PM

## 2022-01-20 NOTE — Patient Instructions (Signed)

## 2022-01-21 LAB — PROTEIN / CREATININE RATIO, URINE
Creatinine, Urine: 262.4 mg/dL
Protein, Ur: 74.5 mg/dL
Protein/Creat Ratio: 284 mg/g creat — ABNORMAL HIGH (ref 0–200)

## 2022-01-22 LAB — URINE CULTURE

## 2022-01-24 ENCOUNTER — Other Ambulatory Visit: Payer: BC Managed Care – PPO

## 2022-01-24 ENCOUNTER — Ambulatory Visit (INDEPENDENT_AMBULATORY_CARE_PROVIDER_SITE_OTHER): Payer: BC Managed Care – PPO | Admitting: Advanced Practice Midwife

## 2022-01-24 ENCOUNTER — Encounter: Payer: BC Managed Care – PPO | Admitting: Women's Health

## 2022-01-24 ENCOUNTER — Other Ambulatory Visit (HOSPITAL_COMMUNITY)
Admission: RE | Admit: 2022-01-24 | Discharge: 2022-01-24 | Disposition: A | Discharge status: Home / Self Care | Payer: BC Managed Care – PPO | Source: Ambulatory Visit | Attending: Advanced Practice Midwife | Admitting: Advanced Practice Midwife

## 2022-01-24 ENCOUNTER — Encounter: Payer: Self-pay | Admitting: Advanced Practice Midwife

## 2022-01-24 VITALS — BP 101/58 | HR 76 | Wt 176.0 lb

## 2022-01-24 DIAGNOSIS — O24410 Gestational diabetes mellitus in pregnancy, diet controlled: Secondary | ICD-10-CM

## 2022-01-24 DIAGNOSIS — O09523 Supervision of elderly multigravida, third trimester: Secondary | ICD-10-CM | POA: Diagnosis not present

## 2022-01-24 DIAGNOSIS — O133 Gestational [pregnancy-induced] hypertension without significant proteinuria, third trimester: Secondary | ICD-10-CM

## 2022-01-24 DIAGNOSIS — Z3A36 36 weeks gestation of pregnancy: Secondary | ICD-10-CM | POA: Insufficient documentation

## 2022-01-24 DIAGNOSIS — O0993 Supervision of high risk pregnancy, unspecified, third trimester: Secondary | ICD-10-CM | POA: Diagnosis not present

## 2022-01-24 NOTE — Patient Instructions (Signed)
[name] Taylor, I greatly value your feedback.  If you receive a survey following your visit with [place] today, we appreciate you taking the time to fill it out.  Thanks, [name], DNP, CNM  [facility]!!! It is now [place] [facility] ([place], [place] [id number]) Entrance located off of [address] parking   Go to [email or web address] to register for FREE online childbirth classes    Call the office [phone]) or go to [place] if: You begin to have strong, frequent contractions Your water breaks.  Sometimes it is a big gush of fluid, sometimes it is just a trickle that keeps getting your panties wet or running down your legs You have vaginal bleeding.  It is normal to have a small amount of spotting if your cervix was checked.  You don't feel your baby moving like normal.  If you don't, get you something to eat and drink and lay down and focus on feeling your baby move.  You should feel at least 10 movements in 2 hours.  If you don't, you should call the office or go to [place] Blood Pressure Monitoring for Patients   Your provider has recommended that you check your blood pressure (BP) at least once a week at home. If you do not have a blood pressure cuff at home, one will be provided for you. Contact your provider if you have not received your monitor within 1 week.   Helpful Tips for Accurate Home Blood Pressure Checks  Don't smoke, exercise, or drink caffeine 30 minutes before checking your BP Use the restroom before checking your BP (a full bladder can raise your pressure) Relax in a comfortable upright chair Feet on the ground Left arm resting comfortably on a flat surface at the level of your heart Legs uncrossed Back supported Sit quietly and don't talk Place the cuff on your bare arm Adjust snuggly, so that only two fingertips can fit between your skin and the top of  the cuff Check 2 readings separated by at least one minute Keep a log of your BP readings For a visual, please reference this diagram: http://ccnc.care/bpdiagram  Provider Name: [facility]     Phone: [phone]  Zone 1: ALL CLEAR  Continue to monitor your symptoms:  BP reading is less than 140 (top number) or less than 90 (bottom number)  No right upper stomach pain No headaches or seeing spots No feeling nauseated or throwing up No swelling in face and hands  Zone 2: CAUTION Call your doctor's office for any of the following:  BP reading is greater than 140 (top number) or greater than 90 (bottom number)  Stomach pain under your ribs in the middle or right side Headaches or seeing spots Feeling nauseated or throwing up Swelling in face and hands  Zone 3: EMERGENCY  Seek immediate medical care if you have any of the following:  BP reading is greater than160 (top number) or greater than 110 (bottom number) Severe headaches not improving with Tylenol Serious difficulty catching your breath Any worsening symptoms from Zone 2

## 2022-01-24 NOTE — Progress Notes (Signed)
HIGH-RISK PREGNANCY VISIT Patient name: [name] Taylor MRN [id number]  Date of birth: 01-10-1985 Chief Complaint:   Routine Prenatal Visit, High Risk Gestation, and Non-stress Test (culture)  History of Present Illness:   [name] Taylor is a 37 y.o. [phone] female at [redacted]w[redacted]d with an Estimated Date of Delivery: 02/19/22 being seen today for ongoing management of a high-risk pregnancy complicated by diabetes mellitus A1DM and gestational hypertension currently on no meds.  Pr/Cr ratio 284 on 1/18, urine cx neg.  FBS <95 and 2hr pp ,120.  BP is "high in the mornings and drops during the day"  Today 146/96.   Today she reports no complaints. Contractions: Irregular. Has felt some pressure over the last few days, and "like I have to go to the bathroom", but no real ctx. None on EFM .  Movement: Present. denies leaking of fluid.      08/04/2021    2:29 PM 02/10/2020    2:08 PM 11/09/2017    8:53 AM  Depression screen PHQ 2/9  Decreased Interest 3 0 0  Down, Depressed, Hopeless 0 0 0  PHQ - 2 Score 3 0 0  Altered sleeping 2    Tired, decreased energy 3    Change in appetite 2    Feeling bad or failure about yourself  0    Trouble concentrating 0    Moving slowly or fidgety/restless 0    Suicidal thoughts 0    PHQ-9 Score 10          08/04/2021    2:29 PM  GAD 7 : Generalized Anxiety Score  Nervous, Anxious, on Edge 0  Control/stop worrying 0  Worry too much - different things 0  Trouble relaxing 0  Restless 0  Easily annoyed or irritable 0  Afraid - awful might happen 0  Total GAD 7 Score 0     Review of Systems:   Pertinent items are noted in HPI Denies abnormal vaginal discharge w/ itching/odor/irritation, headaches, visual changes, shortness of breath, chest pain, abdominal pain, severe nausea/vomiting, or problems with urination or bowel movements unless otherwise stated above. Pertinent History Reviewed:  Reviewed past medical,surgical, social, obstetrical and family history.   Reviewed problem list, medications and allergies. Physical Assessment:   Vitals:   01/24/22 1016  BP: (!) 101/58  Pulse: 76  Weight: 176 lb (79.8 kg)  Body mass index is 33.25 kg/m.           Physical Examination:   General appearance: alert, well appearing, and in no distress  Mental status: alert, oriented to person, place, and time  Skin: warm & dry   Extremities: Edema: None    Cardiovascular: normal heart rate noted  Respiratory: normal respiratory effort, no distress  Abdomen: gravid, soft, non-tender  Pelvic: Cervical exam performed         Fetal Status:     Movement: Present    Fetal Surveillance Testing today: NST: FHR baseline 120 bpm, Variability: moderate, Accelerations:present, Decelerations:  Absent= Cat 1/Reactive    Chaperone: Peggy Dones    No results found for this or any previous visit (from the past 24 hour(s)).  Assessment & Plan:  High-risk pregnancy: [id number] at [redacted]w[redacted]d with an Estimated Date of Delivery: 02/19/22      ICD-10-CM   1. Gestational hypertension, third trimester  O13.3    BP very low today, plan to continue twice weekly testing, IOL 37-37.6 or as clinically indicated    2. Supervision of high risk  pregnancy in third trimester  O09.93     3. Diet controlled gestational diabetes mellitus (GDM) in third trimester  O24.410         Meds: No orders of the defined types were placed in this encounter.   Orders: No orders of the defined types were placed in this encounter.    Labs/procedures today: NST   Reviewed: Term labor symptoms and general obstetric precautions including but not limited to vaginal bleeding, contractions, leaking of fluid and fetal movement were reviewed in detail with the patient.  All questions were answered. Does have home bp cuff. Office bp cuff given: not applicable. Check bp daily, let us know if consistently >150 and/or >95.  Follow-up: No follow-ups on file.   Future Appointments  Date Time Provider  [place]  01/27/2022 11:30 AM [facility] NURSE [facility]  02/01/2022 10:30 AM [facility] [place] [facility] None  02/01/2022 11:50 AM Florian Buff, MD [facility]  02/04/2022 10:50 AM [facility] NURSE [facility]    No orders of the defined types were placed in this encounter.  Christin Fudge , DNP, [facility] 01/24/2022 10:30 AM

## 2022-01-25 LAB — CERVICOVAGINAL ANCILLARY ONLY
Chlamydia: NEGATIVE
Comment: NEGATIVE
Comment: NORMAL
Neisseria Gonorrhea: NEGATIVE

## 2022-01-26 ENCOUNTER — Encounter: Payer: Self-pay | Admitting: Advanced Practice Midwife

## 2022-01-26 ENCOUNTER — Ambulatory Visit (INDEPENDENT_AMBULATORY_CARE_PROVIDER_SITE_OTHER): Payer: BC Managed Care – PPO | Admitting: Advanced Practice Midwife

## 2022-01-26 VITALS — BP 122/80 | HR 96 | Wt 175.0 lb

## 2022-01-26 DIAGNOSIS — Z3483 Encounter for supervision of other normal pregnancy, third trimester: Secondary | ICD-10-CM

## 2022-01-26 DIAGNOSIS — O0993 Supervision of high risk pregnancy, unspecified, third trimester: Secondary | ICD-10-CM

## 2022-01-26 DIAGNOSIS — Z3A36 36 weeks gestation of pregnancy: Secondary | ICD-10-CM

## 2022-01-26 LAB — POCT URINALYSIS DIPSTICK OB
Blood, UA: NEGATIVE
Glucose, UA: NEGATIVE
Ketones, UA: NEGATIVE
Nitrite, UA: NEGATIVE
POC,PROTEIN,UA: NEGATIVE

## 2022-01-26 NOTE — Progress Notes (Signed)
HIGH-RISK PREGNANCY VISIT- work in visit Patient name: [name] Taylor MRN [id number]  Date of birth: 1985/07/16 Chief Complaint:   Routine Prenatal Visit  History of Present Illness:   [name] Taylor is a 37 y.o. [phone] female at [redacted]w[redacted]d with an Estimated Date of Delivery: 02/19/22 being seen today for ongoing management of a high-risk pregnancy complicated by gestational hypertension currently on no meds; also GDMA1.    Today she reports  reg ctx overnight (more irreg now); H/A that Tylenol hasn't relieved; sees floaters while having H/A; reports high BP values at home . Contractions: Irritability. Vag. Bleeding: None.  Movement: Present. denies leaking of fluid.      08/04/2021    2:29 PM 02/10/2020    2:08 PM 11/09/2017    8:53 AM  Depression screen PHQ 2/9  Decreased Interest 3 0 0  Down, Depressed, Hopeless 0 0 0  PHQ - 2 Score 3 0 0  Altered sleeping 2    Tired, decreased energy 3    Change in appetite 2    Feeling bad or failure about yourself  0    Trouble concentrating 0    Moving slowly or fidgety/restless 0    Suicidal thoughts 0    PHQ-9 Score 10          08/04/2021    2:29 PM  GAD 7 : Generalized Anxiety Score  Nervous, Anxious, on Edge 0  Control/stop worrying 0  Worry too much - different things 0  Trouble relaxing 0  Restless 0  Easily annoyed or irritable 0  Afraid - awful might happen 0  Total GAD 7 Score 0     Review of Systems:   Pertinent items are noted in HPI Denies abnormal vaginal discharge w/ itching/odor/irritation, headaches, visual changes, shortness of breath, chest pain, abdominal pain, severe nausea/vomiting, or problems with urination or bowel movements unless otherwise stated above. Pertinent History Reviewed:  Reviewed past medical,surgical, social, obstetrical and family history.  Reviewed problem list, medications and allergies. Physical Assessment:   Vitals:   01/26/22 1029 01/26/22 1039  BP: (!) 149/80 122/80  Pulse: 92 96  Weight:  175 lb (79.4 kg) 175 lb (79.4 kg)  Body mass index is 33.07 kg/m.           Physical Examination:   General appearance: alert, well appearing, and in no distress  Mental status: alert, oriented to person, place, and time  Skin: warm & dry   Extremities:      Cardiovascular: normal heart rate noted  Respiratory: normal respiratory effort, no distress  Abdomen: gravid, soft, non-tender  Pelvic: Cervical exam performed  Dilation: 4 Effacement (%): 80 Station: -2  Fetal Status: Fetal Heart Rate (bpm): 144 Fundal Height: 37 cm Movement: Present Presentation: Vertex  Fetal Surveillance Testing today: doppler    Results for orders placed or performed in visit on 01/26/22 (from the past 24 hour(s))  POC Urinalysis Dipstick OB   Collection Time: 01/26/22 10:38 AM  Result Value Ref Range   Color, UA     Clarity, UA     Glucose, UA Negative Negative   Bilirubin, UA     Ketones, UA neg    Spec Grav, UA     Blood, UA neg    pH, UA     POC,PROTEIN,UA Negative Negative, Trace, Small (1+), Moderate (2+), Large (3+), 4+   Urobilinogen, UA     Nitrite, UA neg    Leukocytes, UA Trace (A) Negative  Appearance     Odor      Assessment & Plan:  High-risk pregnancy: [id number] at [redacted]w[redacted]d with an Estimated Date of Delivery: 02/19/22   1) gHTN, now w H/A unrelieved by Tylenol and intermittent floaters; states persistent mild range BP values at home  2) GDMA, states CBGs are within range (I didn't look at log)  3) Prodromal v latent labor  Meds: No orders of the defined types were placed in this encounter.   Labs/procedures today: SVE  Treatment Plan:  to MAU for further eval of H/A/blood pressures/labs/cervical change   Follow-up: Return for As scheduled.   Future Appointments  Date Time Provider [place]  01/27/2022 11:30 AM [facility] NURSE [facility]  02/01/2022 10:30 AM [facility] [place] [facility] None  02/01/2022 11:50 AM Florian Buff, MD [facility]  02/04/2022  10:50 AM [facility] NURSE [facility]    Orders Placed This Encounter  Procedures   POC Urinalysis Dipstick OB   Myrtis Ser CNM 01/26/2022 11:03 AM

## 2022-01-27 ENCOUNTER — Ambulatory Visit (INDEPENDENT_AMBULATORY_CARE_PROVIDER_SITE_OTHER): Payer: BC Managed Care – PPO | Admitting: *Deleted

## 2022-01-27 DIAGNOSIS — O24410 Gestational diabetes mellitus in pregnancy, diet controlled: Secondary | ICD-10-CM | POA: Diagnosis not present

## 2022-01-27 DIAGNOSIS — O0993 Supervision of high risk pregnancy, unspecified, third trimester: Secondary | ICD-10-CM

## 2022-01-27 DIAGNOSIS — O133 Gestational [pregnancy-induced] hypertension without significant proteinuria, third trimester: Secondary | ICD-10-CM

## 2022-01-27 DIAGNOSIS — Z3A36 36 weeks gestation of pregnancy: Secondary | ICD-10-CM | POA: Diagnosis not present

## 2022-01-27 NOTE — Progress Notes (Addendum)
   NURSE VISIT- NST  SUBJECTIVE:  [name] Taylor is a 37 y.o. [phone] female at [redacted]w[redacted]d, here for a NST for pregnancy complicated by Diabetes: A1DM} and GHTN.  She reports active fetal movement, contractions: none, vaginal bleeding: none, membranes: intact.   OBJECTIVE:  BP 114/64   Pulse 87   LMP 05/17/2021 (Approximate)   Appears well, no apparent distress  No results found for this or any previous visit (from the past 24 hour(s)).  NST: FHR baseline 130 bpm, Variability: moderate, Accelerations:present, Decelerations:  Absent= Cat 1/reactive [place]oco: none   ASSESSMENT: [id number] at [redacted]w[redacted]d with Diabetes: A1DM} and GHTN NST reactive  PLAN: EFM strip reviewed by [name], CNM   Recommendations: keep next appointment as scheduled    Alice Rieger  01/27/2022 4:38 PM  Chart reviewed for nurse visit. Agree with plan of care.  Christin Fudge, [place] 02/03/2022 2:35 PM

## 2022-01-28 LAB — CULTURE, BETA STREP (GROUP B ONLY): Strep Gp B Culture: NEGATIVE

## 2022-01-31 ENCOUNTER — Encounter (HOSPITAL_COMMUNITY): Payer: Self-pay | Admitting: Obstetrics & Gynecology

## 2022-01-31 ENCOUNTER — Other Ambulatory Visit: Payer: Self-pay | Admitting: Obstetrics & Gynecology

## 2022-01-31 ENCOUNTER — Inpatient Hospital Stay (HOSPITAL_COMMUNITY)
Admission: AD | Admit: 2022-01-31 | Discharge: 2022-02-02 | DRG: 807 | Disposition: A | Discharge status: Home / Self Care | Payer: BC Managed Care – PPO | Attending: Family Medicine | Admitting: Family Medicine

## 2022-01-31 ENCOUNTER — Other Ambulatory Visit: Payer: Self-pay

## 2022-01-31 DIAGNOSIS — O4292 Full-term premature rupture of membranes, unspecified as to length of time between rupture and onset of labor: Principal | ICD-10-CM | POA: Diagnosis present

## 2022-01-31 DIAGNOSIS — O4202 Full-term premature rupture of membranes, onset of labor within 24 hours of rupture: Secondary | ICD-10-CM | POA: Diagnosis not present

## 2022-01-31 DIAGNOSIS — O134 Gestational [pregnancy-induced] hypertension without significant proteinuria, complicating childbirth: Secondary | ICD-10-CM | POA: Diagnosis present

## 2022-01-31 DIAGNOSIS — O24410 Gestational diabetes mellitus in pregnancy, diet controlled: Secondary | ICD-10-CM

## 2022-01-31 DIAGNOSIS — O10913 Unspecified pre-existing hypertension complicating pregnancy, third trimester: Secondary | ICD-10-CM

## 2022-01-31 DIAGNOSIS — Z87891 Personal history of nicotine dependence: Secondary | ICD-10-CM

## 2022-01-31 DIAGNOSIS — Z8751 Personal history of pre-term labor: Secondary | ICD-10-CM

## 2022-01-31 DIAGNOSIS — O24420 Gestational diabetes mellitus in childbirth, diet controlled: Secondary | ICD-10-CM | POA: Diagnosis present

## 2022-01-31 DIAGNOSIS — Z3A37 37 weeks gestation of pregnancy: Secondary | ICD-10-CM | POA: Diagnosis not present

## 2022-01-31 DIAGNOSIS — O09523 Supervision of elderly multigravida, third trimester: Secondary | ICD-10-CM | POA: Diagnosis not present

## 2022-01-31 LAB — COMPREHENSIVE METABOLIC PANEL
ALT: 10 U/L (ref 0–44)
AST: 15 U/L (ref 15–41)
Albumin: 2.9 g/dL — ABNORMAL LOW (ref 3.5–5.0)
Alkaline Phosphatase: 94 U/L (ref 38–126)
Anion gap: 11 (ref 5–15)
BUN: 8 mg/dL (ref 6–20)
CO2: 20 mmol/L — ABNORMAL LOW (ref 22–32)
Calcium: 8.9 mg/dL (ref 8.9–10.3)
Chloride: 107 mmol/L (ref 98–111)
Creatinine, Ser: 0.68 mg/dL (ref 0.44–1.00)
GFR, Estimated: >60 mL/min (ref >60)
Glucose, Bld: 94 mg/dL (ref 70–99)
Potassium: 3.7 mmol/L (ref 3.5–5.1)
Sodium: 138 mmol/L (ref 135–145)
Total Bilirubin: 0.6 mg/dL (ref 0.3–1.2)
Total Protein: 5.8 g/dL — ABNORMAL LOW (ref 6.5–8.1)

## 2022-01-31 LAB — TYPE AND SCREEN
ABO/RH(D): O POS
Antibody Screen: NEGATIVE

## 2022-01-31 LAB — CBC
HCT: 32.1 % — ABNORMAL LOW (ref 36.0–46.0)
Hemoglobin: 11.2 g/dL — ABNORMAL LOW (ref 12.0–15.0)
MCH: 32.1 pg (ref 26.0–34.0)
MCHC: 34.9 g/dL (ref 30.0–36.0)
MCV: 92 fL (ref 80.0–100.0)
Platelets: 242 10*3/uL (ref 150–400)
RBC: 3.49 MIL/uL — ABNORMAL LOW (ref 3.87–5.11)
RDW: 13.6 % (ref 11.5–15.5)
WBC: 14.9 10*3/uL — ABNORMAL HIGH (ref 4.0–10.5)
nRBC: 0 % (ref 0.0–0.2)

## 2022-01-31 LAB — POCT FERN TEST: POCT Fern Test: POSITIVE

## 2022-01-31 MED ORDER — LACTATED RINGERS IV SOLN: INTRAVENOUS | Status: DC

## 2022-01-31 MED ORDER — LIDOCAINE HCL (PF) 1 % IJ SOLN: 30.0000 mL | INTRAMUSCULAR | Status: DC | PRN

## 2022-01-31 MED ORDER — OXYCODONE-ACETAMINOPHEN 5-325 MG PO TABS: 2.0000 | ORAL_TABLET | ORAL | Status: DC | PRN

## 2022-01-31 MED ORDER — LACTATED RINGERS IV SOLN: 500.0000 mL | INTRAVENOUS | Status: DC | PRN

## 2022-01-31 MED ORDER — ACETAMINOPHEN 325 MG PO TABS
650.0000 mg | ORAL_TABLET | ORAL | Status: DC | PRN
  Administered 2022-02-01: 650 mg via ORAL
  Filled 2022-01-31: qty 2

## 2022-01-31 MED ORDER — SOD CITRATE-CITRIC ACID 500-334 MG/5ML PO SOLN: 30.0000 mL | ORAL | Status: DC | PRN

## 2022-01-31 MED ORDER — OXYCODONE-ACETAMINOPHEN 5-325 MG PO TABS: 1.0000 | ORAL_TABLET | ORAL | Status: DC | PRN

## 2022-01-31 MED ORDER — OXYTOCIN BOLUS FROM INFUSION
333.0000 mL | Freq: Once | INTRAVENOUS | Status: AC
  Administered 2022-02-01: 333 mL via INTRAVENOUS

## 2022-01-31 MED ORDER — ONDANSETRON HCL 4 MG/2ML IJ SOLN: 4.0000 mg | Freq: Four times a day (QID) | INTRAMUSCULAR | Status: DC | PRN

## 2022-01-31 MED ORDER — OXYTOCIN-SODIUM CHLORIDE 30-0.9 UT/500ML-% IV SOLN: 2.5000 [IU]/h | INTRAVENOUS | Status: DC
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
## 2022-01-31 NOTE — MAU Note (Signed)
.  [name] Taylor is a 37 y.o. at [redacted]w[redacted]d here in MAU reporting: SROM with clear fluid about 1930 today. PT reports DFM for the last couple hours. Pt denies CTX, VB, recent intercourse, HX GHTN, GDM diet controlled GBS neg SVE last Tuesday 4cm Onset of complaint: 1930  Pain score: 0/10 There were no vitals filed for this visit.   FHT:133  Lab orders placed from triage:

## 2022-02-01 ENCOUNTER — Encounter (HOSPITAL_COMMUNITY): Payer: Self-pay | Admitting: Obstetrics & Gynecology

## 2022-02-01 ENCOUNTER — Other Ambulatory Visit: Payer: BC Managed Care – PPO

## 2022-02-01 ENCOUNTER — Encounter: Payer: BC Managed Care – PPO | Admitting: Obstetrics & Gynecology

## 2022-02-01 DIAGNOSIS — O4202 Full-term premature rupture of membranes, onset of labor within 24 hours of rupture: Secondary | ICD-10-CM

## 2022-02-01 DIAGNOSIS — Z3A37 37 weeks gestation of pregnancy: Secondary | ICD-10-CM

## 2022-02-01 DIAGNOSIS — O134 Gestational [pregnancy-induced] hypertension without significant proteinuria, complicating childbirth: Secondary | ICD-10-CM

## 2022-02-01 DIAGNOSIS — O09523 Supervision of elderly multigravida, third trimester: Secondary | ICD-10-CM

## 2022-02-01 DIAGNOSIS — O24420 Gestational diabetes mellitus in childbirth, diet controlled: Secondary | ICD-10-CM

## 2022-02-01 LAB — GLUCOSE, CAPILLARY: Glucose-Capillary: 94 mg/dL (ref 70–99)

## 2022-02-01 LAB — RPR: RPR Ser Ql: NONREACTIVE

## 2022-02-01 MED ORDER — ACETAMINOPHEN 325 MG PO TABS
650.0000 mg | ORAL_TABLET | ORAL | Status: DC | PRN
  Administered 2022-02-01: 650 mg via ORAL
  Filled 2022-02-01: qty 2

## 2022-02-01 MED ORDER — IBUPROFEN 600 MG PO TABS
600.0000 mg | ORAL_TABLET | Freq: Four times a day (QID) | ORAL | Status: DC
  Administered 2022-02-01 – 2022-02-02 (×4): 600 mg via ORAL
  Filled 2022-02-01 (×4): qty 1

## 2022-02-01 MED ORDER — OXYCODONE-ACETAMINOPHEN 5-325 MG PO TABS: 2.0000 | ORAL_TABLET | ORAL | Status: DC | PRN

## 2022-02-01 MED ORDER — TERBUTALINE SULFATE 1 MG/ML IJ SOLN: 0.2500 mg | Freq: Once | INTRAMUSCULAR | Status: DC | PRN

## 2022-02-01 MED ORDER — WITCH HAZEL-GLYCERIN EX PADS: 1.0000 | MEDICATED_PAD | CUTANEOUS | Status: DC | PRN

## 2022-02-01 MED ORDER — OXYCODONE-ACETAMINOPHEN 5-325 MG PO TABS: 1.0000 | ORAL_TABLET | ORAL | Status: DC | PRN

## 2022-02-01 MED ORDER — SIMETHICONE 80 MG PO CHEW: 80.0000 mg | CHEWABLE_TABLET | ORAL | Status: DC | PRN

## 2022-02-01 MED ORDER — ONDANSETRON HCL 4 MG PO TABS: 4.0000 mg | ORAL_TABLET | ORAL | Status: DC | PRN

## 2022-02-01 MED ORDER — DIPHENHYDRAMINE HCL 25 MG PO CAPS: 25.0000 mg | ORAL_CAPSULE | Freq: Four times a day (QID) | ORAL | Status: DC | PRN

## 2022-02-01 MED ORDER — DIBUCAINE (PERIANAL) 1 % EX OINT: 1.0000 | TOPICAL_OINTMENT | CUTANEOUS | Status: DC | PRN

## 2022-02-01 MED ORDER — COCONUT OIL OIL: 1.0000 | TOPICAL_OIL | Status: DC | PRN

## 2022-02-01 MED ORDER — SENNOSIDES-DOCUSATE SODIUM 8.6-50 MG PO TABS
2.0000 | ORAL_TABLET | Freq: Every day | ORAL | Status: DC
  Administered 2022-02-02: 2 via ORAL
  Filled 2022-02-01: qty 2

## 2022-02-01 MED ORDER — ONDANSETRON HCL 4 MG/2ML IJ SOLN: 4.0000 mg | INTRAMUSCULAR | Status: DC | PRN

## 2022-02-01 MED ORDER — BENZOCAINE-MENTHOL 20-0.5 % EX AERO: 1.0000 | INHALATION_SPRAY | CUTANEOUS | Status: DC | PRN

## 2022-02-01 MED ORDER — ZOLPIDEM TARTRATE 5 MG PO TABS: 5.0000 mg | ORAL_TABLET | Freq: Every evening | ORAL | Status: DC | PRN

## 2022-02-01 MED ORDER — TETANUS-DIPHTH-ACELL PERTUSSIS 5-2.5-18.5 LF-MCG/0.5 IM SUSY: 0.5000 mL | PREFILLED_SYRINGE | Freq: Once | INTRAMUSCULAR | Status: DC

## 2022-02-01 MED ORDER — PRENATAL MULTIVITAMIN CH
1.0000 | ORAL_TABLET | Freq: Every day | ORAL | Status: DC
  Administered 2022-02-01 – 2022-02-02 (×2): 1 via ORAL
  Filled 2022-02-01 (×2): qty 1

## 2022-02-01 MED ORDER — OXYTOCIN-SODIUM CHLORIDE 30-0.9 UT/500ML-% IV SOLN
1.0000 m[IU]/min | INTRAVENOUS | Status: DC
  Administered 2022-02-01: 2 m[IU]/min via INTRAVENOUS
  Filled 2022-02-01: qty 500

## 2022-02-01 NOTE — Discharge Summary (Shared)
Postpartum Discharge Summary  Date of Service updated***     Patient Name: [name] Taylor DOB: 1985-10-26 MRN: [id number]  Date of admission: 01/31/2022 Delivery date:02/01/2022  Delivering provider: Truett Mainland  Date of discharge: 02/01/2022  Admitting diagnosis: Indication for care in labor or delivery [O75.9] Intrauterine pregnancy: [redacted]w[redacted]d     Secondary diagnosis:  Principal Problem:   Indication for care in labor or delivery  Additional problems: none   Discharge diagnosis: Term Pregnancy Delivered and GDM A1                                              Post partum procedures: none Augmentation: Pitocin Complications: None  Hospital course: Induction of Labor With Vaginal Delivery   37 y.o. yo [phone] at [redacted]w[redacted]d was admitted to the hospital 01/31/2022 for induction of labor.  Indication for induction: PROM.  Patient had an labor course uncomplicated. Membrane Rupture Time/Date: 7:30 PM ,01/31/2022   Delivery Method:Vaginal, Spontaneous  Episiotomy: None  Lacerations:  None  Details of delivery can be found in separate delivery note.  Patient had a postpartum course complicated by***. Patient is discharged home 02/01/22.  Newborn Data: Birth date:02/01/2022  Birth time:9:40 AM  Gender:Female  Living status:Living  Apgars:9 ,9  Weight:   Magnesium Sulfate received: No BMZ received: No Rhophylac:N/A MMR:N/A T-DaP:Given prenatally Flu: No Transfusion:No  Physical exam  Vitals:   02/01/22 0115 02/01/22 0420 02/01/22 0630 02/01/22 0809  BP: 117/64 133/78 132/71 126/65  Pulse: 68 66 62 70  Resp: 17 18 18 16   Temp: 98 F (36.7 C) 98 F (36.7 C) 97.6 F (36.4 C)   TempSrc: Oral Oral Oral    General: {Exam; general:21111117} Lochia: {Desc; appropriate/inappropriate:30686::"appropriate"} Uterine Fundus: {Desc; firm/soft:30687} Incision: {Exam; incision:21111123} DVT Evaluation: {Exam; dvt:2111122} Labs: Lab Results  Component Value Date   WBC 14.9 (H)  01/31/2022   HGB 11.2 (L) 01/31/2022   HCT 32.1 (L) 01/31/2022   MCV 92.0 01/31/2022   PLT 242 01/31/2022      Latest Ref Rng & Units 01/31/2022    9:21 PM  CMP  Glucose 70 - 99 mg/dL 94   BUN 6 - 20 mg/dL 8   Creatinine 0.44 - 1.00 mg/dL 0.68   Sodium 135 - 145 mmol/L 138   Potassium 3.5 - 5.1 mmol/L 3.7   Chloride 98 - 111 mmol/L 107   CO2 22 - 32 mmol/L 20   Calcium 8.9 - 10.3 mg/dL 8.9   Total Protein 6.5 - 8.1 g/dL 5.8   Total Bilirubin 0.3 - 1.2 mg/dL 0.6   Alkaline Phos 38 - 126 U/L 94   AST 15 - 41 U/L 15   ALT 0 - 44 U/L 10    Edinburgh Score:     No data to display           After visit meds:  Allergies as of 02/01/2022   No Known Allergies   Med Rec must be completed prior to using this [facility]***        Discharge home in stable condition Infant Feeding: {Baby feeding:23562} Infant Disposition:{CHL IP OB HOME WITH IDPOEU:23536} Discharge instruction: per After Visit Summary and Postpartum booklet. Activity: Advance as tolerated. Pelvic rest for 6 weeks.  Diet: {OB RWER:15400867} Future Appointments:No future appointments. Follow up Visit:   Please schedule this patient for a In person  postpartum visit in 4 weeks with the following provider: Any provider. Additional Postpartum F/U: none   High risk pregnancy complicated by: GDM Delivery mode:  Vaginal, Spontaneous  Anticipated Birth Control:  Nexplanon   02/01/2022 Truett Mainland, DO

## 2022-02-01 NOTE — Progress Notes (Signed)
Labor Progress Note [name] Taylor is a 37 y.o. [phone] at [redacted]w[redacted]d  presented for spontaneous onset of labor and SROM S: doing well  O:  BP 133/78   Pulse 66   Temp 98 F (36.7 C) (Oral)   Resp 18   LMP 05/17/2021 (Approximate)  EFM: 125bpm/moderate/+accels no decels  CVE: Dilation: 5.5 Effacement (%): 80 Cervical Position: Posterior Station: 0 Presentation: Vertex Exam by:: Linton Rump, RN   A&P: 37 y.o. [id number] [redacted]w[redacted]d SROM and sol #Labor: Progressing well. Continues to have contractions and making cervical change on her on. Will continue observation. Consider pitocin if labor slows. #Pain: will like to avoid epidural #FWB: Cat 1 #GBS negative  cHTN - blood pressures well controlled.  Kaci Dillie Sherrilyn Rist, MD 6:06 AM

## 2022-02-02 LAB — CBC
HCT: 30.5 % — ABNORMAL LOW (ref 36.0–46.0)
Hemoglobin: 10.3 g/dL — ABNORMAL LOW (ref 12.0–15.0)
MCH: 31.4 pg (ref 26.0–34.0)
MCHC: 33.8 g/dL (ref 30.0–36.0)
MCV: 93 fL (ref 80.0–100.0)
Platelets: 229 10*3/uL (ref 150–400)
RBC: 3.28 MIL/uL — ABNORMAL LOW (ref 3.87–5.11)
RDW: 13.6 % (ref 11.5–15.5)
WBC: 18.7 10*3/uL — ABNORMAL HIGH (ref 4.0–10.5)
nRBC: 0 % (ref 0.0–0.2)

## 2022-02-02 MED ORDER — BENZOCAINE-MENTHOL 20-0.5 % EX AERO: 1.0000 | INHALATION_SPRAY | CUTANEOUS | 0 refills | Status: DC | PRN

## 2022-02-02 MED ORDER — FUROSEMIDE 20 MG PO TABS: 20.0000 mg | ORAL_TABLET | Freq: Every day | ORAL | 0 refills | Status: DC

## 2022-02-02 MED ORDER — IBUPROFEN 600 MG PO TABS: 600.0000 mg | ORAL_TABLET | Freq: Four times a day (QID) | ORAL | 0 refills | Status: DC

## 2022-02-02 NOTE — Progress Notes (Signed)
Circumcision Consent  Discussed with mom at bedside about circumcision.   Circumcision is a surgery that removes the skin that covers the tip of the penis, called the "foreskin." Circumcision is usually done when a boy is between 86 and 64 days old, sometimes up to 68-52 weeks old.  The most common reasons boys are circumcised include for cultural/religious beliefs or for parental preference (potentially easier to clean, so baby looks like daddy, etc).  There may be some medical benefits for circumcision:   Circumcised boys seem to have slightly lower rates of: ? Urinary tract infections (per the American Academy of Pediatrics an uncircumcised boy has a 1/100 chance of developing a UTI in the first year of life, a circumcised boy at a 01/998 chance of developing a UTI in the first year of life- a 10% reduction) ? Penis cancer (typically rare- an uncircumcised female has a 1 in 100,000 chance of developing cancer of the penis) ? Sexually transmitted infection (in endemic areas, including HIV, HPV and Herpes- circumcision does NOT protect against gonorrhea, chlamydia, trachomatis, or syphilis) ? Phimosis: a condition where that makes retraction of the foreskin over the glans impossible (0.4 per 1000 boys per year or 0.6% of boys are affected by their 15th birthday)  Boys and men who are not circumcised can reduce these extra risks by: ? Cleaning their penis well ? Using condoms during sex  What are the risks of circumcision?  As with any surgical procedure, there are risks and complications. In circumcision, complications are rare and usually minor, the most common being: ? Bleeding- risk is reduced by holding each clamp for 30 seconds prior to a cut being made, and by holding pressure after the procedure is done ? Infection- the penis is cleaned prior to the procedure, and the procedure is done under sterile technique ? Damage to the urethra or amputation of the penis  How is circumcision done  in baby boys?  The baby will be placed on a special table and the legs restrained for their safety. Numbing medication is injected into the penis, and the skin is cleansed with betadine to decrease the risk of infection.   What to expect:  The penis will look red and raw for 5-7 days as it heals. We expect scabbing around where the cut was made, as well as clear-pink fluid and some swelling of the penis right after the procedure. If your baby's circumcision starts to bleed or develops pus, please contact your pediatrician immediately.  All questions were answered and mother consented.  Concepcion Living, MD 7:52 AM

## 2022-02-02 NOTE — Social Work (Addendum)
CSW received consult for hx of marijuana use.  Referral was screened out due to the following:  ~MOB had no documented substance use after initial prenatal visit/+UPT.  ~MOB had no positive drug screens after initial prenatal visit/+UPT.  Per chart review no THC use during this pregnancy.  Please consult CSW if current concerns arise or by MOB's request.  CSW will monitor CDS results and make report to Child Protective Services if warranted.  Letta Kocher, [place] Social Worker [phone]

## 2022-02-04 ENCOUNTER — Other Ambulatory Visit: Payer: BC Managed Care – PPO

## 2022-02-12 ENCOUNTER — Telehealth (HOSPITAL_COMMUNITY): Payer: Self-pay

## 2022-02-12 NOTE — Telephone Encounter (Signed)
Patient did not answer phone call. Voicemail left for patient.   Sharyn Lull [facility] 02/12/22,1355

## 2022-03-09 ENCOUNTER — Encounter: Payer: Self-pay | Admitting: Obstetrics and Gynecology

## 2022-03-09 ENCOUNTER — Ambulatory Visit: Payer: Medicaid Other | Admitting: Obstetrics and Gynecology

## 2022-03-09 VITALS — BP 119/68 | HR 66 | Ht 61.0 in | Wt 160.6 lb

## 2022-03-09 DIAGNOSIS — Z30017 Encounter for initial prescription of implantable subdermal contraceptive: Secondary | ICD-10-CM | POA: Insufficient documentation

## 2022-03-09 DIAGNOSIS — O24419 Gestational diabetes mellitus in pregnancy, unspecified control: Secondary | ICD-10-CM

## 2022-03-09 LAB — POCT URINE PREGNANCY: Preg Test, Ur: NEGATIVE

## 2022-03-09 MED ORDER — ETONOGESTREL 68 MG ~~LOC~~ IMPL
68.0000 mg | DRUG_IMPLANT | Freq: Once | SUBCUTANEOUS | Status: AC
  Administered 2022-03-09: 68 mg via SUBCUTANEOUS

## 2022-03-09 NOTE — Progress Notes (Signed)
Subjective:     [name] Taylor is a 36 y.o. female who presents for a postpartum visit. She is 6 weeks postpartum following a spontaneous vaginal delivery. I have fully reviewed the prenatal and intrapartum course. The delivery was at 57 gestational weeks. Outcome: spontaneous vaginal delivery. Anesthesia: none. Postpartum course has been unremarkable. Baby's course has been unremarkable. Baby is feeding by bottle - Carn[employer] Good Start. Bleeding no bleeding. Bowel function is normal. Bladder function is normal. Patient is not sexually active. Contraception method is Nexplanon. Postpartum depression screening: negative.  Medical records  Review of Systems Pertinent items noted in HPI and remainder of comprehensive ROS otherwise negative.   Objective:    BP 119/68 (BP Location: Right Arm, Patient Position: Sitting, Cuff Size: Normal)   Pulse 66   Ht '5\' 1"'$  (1.549 m)   Wt 160 lb 9.6 oz (72.8 kg)   LMP 05/17/2021 (Approximate)   Breastfeeding No   BMI 30.35 kg/m   General:  alert   Breasts:  inspection negative, no nipple discharge or bleeding, no masses or nodularity palpable and NA  Lungs: clear to auscultation bilaterally  Heart:  regular rate and rhythm, S1, S2 normal, no murmur, click, rub or gallop  Abdomen: soft, non-tender; bowel sounds normal; no masses,  no organomegaly   Vulva:  not evaluated  Vagina: not evaluated  Cervix:  NA  Corpus: not examined  Adnexa:  not evaluated  Rectal Exam: Not performed.              GYNECOLOGY CLINIC PROCEDURE NOTE    Nexplanon Insertion Procedure Patient identified, informed consent performed, consent signed.   Patient does understand that irregular bleeding is a very common side effect of this medication. She was advised to have backup contraception for one week after placement. Pregnancy test in clinic today was negative.  Appropriate time out taken.  Patient's left arm was prepped and draped in the usual sterile fashion.. The ruler  used to measure and mark insertion area.  Patient was prepped with alcohol swab and then injected with 3 ml of 1% lidocaine.  She was prepped with betadine, Nexplanon removed from packaging,  Device confirmed in needle, then inserted full length of needle and withdrawn per handbook instructions. Nexplanon was able to palpated in the patient's arm; patient palpated the insert herself. There was minimal blood loss.  Patient insertion site covered with guaze and a pressure bandage to reduce any bruising.  The patient tolerated the procedure well and was given post procedure instructions.    Assessment:    NL postpartum exam.  Nexplanon insertion H/O GDM  Plan:    1. Contraception: Nexplanon. See above 2. Will schedule 2 hr PP GTT 3. Follow up PRN

## 2022-03-18 ENCOUNTER — Other Ambulatory Visit: Payer: Medicaid Other

## 2022-03-18 DIAGNOSIS — Z8632 Personal history of gestational diabetes: Secondary | ICD-10-CM

## 2022-03-18 DIAGNOSIS — Z131 Encounter for screening for diabetes mellitus: Secondary | ICD-10-CM

## 2022-03-28 ENCOUNTER — Telehealth: Payer: Self-pay | Admitting: Obstetrics and Gynecology

## 2022-03-28 NOTE — Telephone Encounter (Signed)
Returned patient's call.  States she had Nexplanon placed on 3/6 and bleeding started just spotting on 3/8 and bleeding has not stopped. Informed patient bleeding can occur after starting any forms of birth control.  Advised to give Nexplanon a little more time and if bleeding continues over the next week or so, to let us know.  Pt verbalized understanding with no further questions.

## 2022-03-28 NOTE — Telephone Encounter (Signed)
Patient got Nexplanon on 3/6, started bleeding on 3/8 and hasn't stopped. She wanted to know if this was normal. She stated that she has had one before and it didn't effect her this way. Please advise.

## 2022-04-25 ENCOUNTER — Other Ambulatory Visit: Payer: Self-pay | Admitting: Adult Health

## 2022-04-25 ENCOUNTER — Ambulatory Visit (INDEPENDENT_AMBULATORY_CARE_PROVIDER_SITE_OTHER): Payer: Medicaid Other | Admitting: Adult Health

## 2022-04-25 ENCOUNTER — Encounter: Payer: Self-pay | Admitting: Adult Health

## 2022-04-25 VITALS — BP 120/64 | HR 76 | Ht 61.0 in | Wt 162.0 lb

## 2022-04-25 DIAGNOSIS — Z3202 Encounter for pregnancy test, result negative: Secondary | ICD-10-CM | POA: Insufficient documentation

## 2022-04-25 DIAGNOSIS — Z975 Presence of (intrauterine) contraceptive device: Secondary | ICD-10-CM | POA: Insufficient documentation

## 2022-04-25 DIAGNOSIS — N926 Irregular menstruation, unspecified: Secondary | ICD-10-CM | POA: Diagnosis not present

## 2022-04-25 LAB — POCT URINE PREGNANCY: Preg Test, Ur: NEGATIVE

## 2022-04-25 MED ORDER — MEGESTROL ACETATE 40 MG PO TABS: ORAL_TABLET | ORAL | 1 refills | Status: DC

## 2022-04-25 NOTE — Progress Notes (Signed)
  Subjective:     Patient ID: [name] Taylor, female   DOB: Jan 16, 1985, 37 y.o.   MRN: [id number]  HPI [name] Taylor is a 37 year old white female, single, [id number] in complaining of spotting most days, has nexplanon.  Last pap was negative HPV, NILM 08/04/21.  PCP is Lilyan Punt MD  Review of Systems +spotting with nexplanon Denis any pain. Reviewed past medical,surgical, social and family history. Reviewed medications and allergies.     Objective:   Physical Exam BP 120/64 (BP Location: Left Arm, Patient Position: Sitting, Cuff Size: Normal)   Pulse 76   Ht  (1.549 m)   Wt 162 lb (73.5 kg)   Breastfeeding No   BMI 30.61 kg/m  UPT is negative    Skin warm and dry.Pelvic: external genitalia is normal in appearance no lesions, vagina: +blood,no odor,urethra has no lesions or masses noted, cervix: bulbous, uterus: normal size, shape and contour, non tender, no masses felt, adnexa: no masses or tenderness noted. Bladder is non tender and no masses felt.  Nexplanon easily palpated left arm. Fall risk is low  Upstream - 04/25/22 0937       Pregnancy Intention Screening   Does the patient want to become pregnant in the next year? No    Does the patient's partner want to become pregnant in the next year? No    Would the patient like to discuss contraceptive options today? No      Contraception Wrap Up   Current Method Hormonal Implant    End Method Hormonal Implant            Examination chaperoned by Malachy Mood LPN  Assessment:     1. Pregnancy examination or test, negative result  2. Irregular bleeding Spotting daily with nexplanon Will rx megace to stop bleeding  Explained can have irregular bleeding with nexplanon for months then may not have a period Meds ordered this encounter  Medications   megestrol (MEGACE) 40 MG tablet    Sig: Take 3 x 5 days then 2 x 5 days then 1 daily    Dispense:  45 tablet    Refill:  1    Order Specific Question:   Supervising Provider     Answer:   Despina Hidden, LUTHER H [[id number]]     3. Nexplanon in place Placed 03/09/22    Plan:     Follow up prn

## 2022-05-24 ENCOUNTER — Other Ambulatory Visit: Payer: Self-pay | Admitting: Adult Health

## 2022-06-15 ENCOUNTER — Ambulatory Visit: Payer: Medicaid Other | Admitting: Adult Health

## 2022-06-22 ENCOUNTER — Telehealth: Payer: Self-pay | Admitting: Adult Health

## 2022-06-22 MED ORDER — NORETHIN ACE-ETH ESTRAD-FE 1-20 MG-MCG PO TABS: 1.0000 | ORAL_TABLET | Freq: Every day | ORAL | 3 refills | Status: DC

## 2022-06-22 NOTE — Telephone Encounter (Signed)
Bleeding with nexplanon and megace doe not stop it, so stop megace and will try junel 1/20

## 2022-07-06 DIAGNOSIS — H5213 Myopia, bilateral: Secondary | ICD-10-CM | POA: Diagnosis not present

## 2022-08-04 ENCOUNTER — Encounter: Payer: Medicaid Other | Admitting: Obstetrics and Gynecology

## 2022-08-10 NOTE — Progress Notes (Signed)
This encounter was created in error - please disregard.

## 2022-10-06 ENCOUNTER — Other Ambulatory Visit: Payer: Self-pay | Admitting: Adult Health

## 2022-11-07 ENCOUNTER — Other Ambulatory Visit (HOSPITAL_COMMUNITY)
Admission: RE | Admit: 2022-11-07 | Discharge: 2022-11-07 | Disposition: A | Discharge status: Home / Self Care | Payer: Medicaid Other | Source: Ambulatory Visit | Attending: Women's Health | Admitting: Women's Health

## 2022-11-07 ENCOUNTER — Encounter: Payer: Self-pay | Admitting: Women's Health

## 2022-11-07 ENCOUNTER — Ambulatory Visit (INDEPENDENT_AMBULATORY_CARE_PROVIDER_SITE_OTHER): Payer: Medicaid Other | Admitting: Women's Health

## 2022-11-07 VITALS — BP 118/75 | HR 66 | Ht 61.0 in | Wt 162.0 lb

## 2022-11-07 DIAGNOSIS — Z3046 Encounter for surveillance of implantable subdermal contraceptive: Secondary | ICD-10-CM

## 2022-11-07 DIAGNOSIS — N926 Irregular menstruation, unspecified: Secondary | ICD-10-CM | POA: Diagnosis present

## 2022-11-07 NOTE — Patient Instructions (Signed)
Keep the area clean and dry.  You can remove the big bandage in 24 hours, and the small steri-strip bandage in 3-5 days.  

## 2022-11-07 NOTE — Progress Notes (Signed)
   NEXPLANON REMOVAL Patient name: [name] Taylor MRN [id number]  Date of birth: 02-13-1985 Subjective Findings:   [name] Taylor is a 37 y.o. [id number] Caucasian female being seen today for removal of a Nexplanon. Her Nexplanon was placed 03/09/22.  She desires removal because of irregular bleeding with Nexplanon. Denies abnormal discharge, itching/odor/irritation. Has tried megace and COCs, still taking COCs. Has bled 3x this month. Signed copy of informed consent in chart.   Patient's last menstrual period was 11/05/2022. Last pap8/2/23. Results were: NILM w/ HRHPV negative The planned method of family planning is OCP (estrogen/progesterone)     08/04/2021    2:29 PM 02/10/2020    2:08 PM 11/09/2017    8:53 AM  Depression screen PHQ 2/9  Decreased Interest 3 0 0  Down, Depressed, Hopeless 0 0 0  PHQ - 2 Score 3 0 0  Altered sleeping 2    Tired, decreased energy 3    Change in appetite 2    Feeling bad or failure about yourself  0    Trouble concentrating 0    Moving slowly or fidgety/restless 0    Suicidal thoughts 0    PHQ-9 Score 10          08/04/2021    2:29 PM  GAD 7 : Generalized Anxiety Score  Nervous, Anxious, on Edge 0  Control/stop worrying 0  Worry too much - different things 0  Trouble relaxing 0  Restless 0  Easily annoyed or irritable 0  Afraid - awful might happen 0  Total GAD 7 Score 0     Pertinent History Reviewed:   Reviewed past medical,surgical, social, obstetrical and family history.  Reviewed problem list, medications and allergies. Objective Findings & Procedure:    Vitals:   11/07/22 1355  BP: 118/75  Pulse: 66  Weight: 162 lb (73.5 kg)  Height: 5\' 1"  (1.549 m)  Body mass index is 30.61 kg/m.  No results found for this or any previous visit (from the past 24 hour(s)).   Time out was performed.  Nexplanon site identified.  Area prepped in usual sterile fashon. One cc of 2% lidocaine was used to anesthetize the area at the distal end of the  implant. A small stab incision was made right beside the implant on the distal portion.  The Nexplanon rod was grasped using hemostats and removed without difficulty.  There was less than 3 cc blood loss. There were no complications.  Steri-strips were applied over the small incision and a pressure bandage was applied.  The patient tolerated the procedure well. Assessment & Plan:   1) Nexplanon removal She was instructed to keep the area clean and dry, remove pressure bandage in 24 hours, and keep insertion site covered with the steri-strip for 3-5 days.   Follow-up PRN problems.  2) Prolonged periods/irregular bleeding> self-collected CV swab sent, wanted Nexplanon out no matter what  3) Contraception counseling> continue COCs  No orders of the defined types were placed in this encounter.   Follow-up: Return in about 1 year (around 11/07/2023) for Physical.  Cheral Marker CNM, [facility] 11/07/2022 2:47 PM

## 2022-11-09 LAB — CERVICOVAGINAL ANCILLARY ONLY
Bacterial Vaginitis (gardnerella): POSITIVE — AB
Candida Glabrata: NEGATIVE
Candida Vaginitis: NEGATIVE
Chlamydia: NEGATIVE
Comment: NEGATIVE
Comment: NEGATIVE
Comment: NEGATIVE
Comment: NEGATIVE
Comment: NEGATIVE
Comment: NORMAL
Neisseria Gonorrhea: NEGATIVE
Trichomonas: NEGATIVE

## 2022-11-09 MED ORDER — METRONIDAZOLE 500 MG PO TABS: 500.0000 mg | ORAL_TABLET | Freq: Two times a day (BID) | ORAL | 0 refills | Status: DC

## 2022-11-09 NOTE — Addendum Note (Signed)
Addended by: Cheral Marker on: 11/09/2022 02:56 PM   Modules accepted: Orders

## 2023-01-18 ENCOUNTER — Encounter: Payer: Self-pay | Admitting: Family Medicine

## 2023-05-16 ENCOUNTER — Telehealth: Admitting: Family Medicine

## 2023-05-16 DIAGNOSIS — R3914 Feeling of incomplete bladder emptying: Secondary | ICD-10-CM

## 2023-05-16 MED ORDER — CEPHALEXIN 500 MG PO CAPS: 500.0000 mg | ORAL_CAPSULE | Freq: Two times a day (BID) | ORAL | 0 refills | Status: AC

## 2023-05-16 NOTE — Patient Instructions (Addendum)
  [name] Taylor, thank you for joining [name] Pion, NP for today's virtual visit.  While this provider is not your primary care provider (PCP), if your PCP is located in our provider database this encounter information will be shared with them immediately following your visit.   A [facility] MyChart account gives you access to today's visit and all your visits, tests, and labs performed at [facility] " click here if you don't have a [facility] [facility] MyChart account or go to [email or web address]  Consent: (Patient) [name] Taylor provided verbal consent for this virtual visit at the beginning of the encounter.  Current Medications:  Current Outpatient Medications:    cephALEXin  (KEFLEX ) 500 MG capsule, Take 1 capsule (500 mg total) by mouth 2 (two) times daily for 7 days., Disp: 14 capsule, Rfl: 0   AUROVELA FE 1/20 1-20 MG-MCG tablet, TAKE 1 TABLET BY MOUTH DAILY, Disp: 28 tablet, Rfl: 3   etonogestrel  (NEXPLANON ) 68 MG IMPL implant, 1 each by Subdermal route once., Disp: , Rfl:    metroNIDAZOLE  (FLAGYL ) 500 MG tablet, Take 1 tablet (500 mg total) by mouth 2 (two) times daily., Disp: 14 tablet, Rfl: 0   Medications ordered in this encounter:  Meds ordered this encounter  Medications   cephALEXin  (KEFLEX ) 500 MG capsule    Sig: Take 1 capsule (500 mg total) by mouth 2 (two) times daily for 7 days.    Dispense:  14 capsule    Refill:  0    Supervising Provider:   Corine Dice [[id number]]     *If you need refills on other medications prior to your next appointment, please contact your pharmacy*  Follow-Up: Call back or seek an in-person evaluation if the symptoms worsen or if the condition fails to improve as anticipated.  [facility] [phone]  Other Instructions  -no other red flags for stone or kidney infection -increase fluids -complete medication as discussed -prevention discussed and on AVS -strict in person if symptoms worsen or fail to  improve in 1-2 days   If you have been instructed to have an in-person evaluation today at a local Urgent Care facility, please use the link below. It will take you to a list of all of our available [facility], including address, phone number and hours of operation. Please do not delay care.  [facility]  If you or a family member do not have a primary care provider, use the link below to schedule a visit and establish care. When you choose a [facility] primary care physician or advanced practice provider, you gain a long-term partner in health. Find a Primary Care Provider  Learn more about [facility] in-office and virtual care options: [facility] - Get Care Now

## 2023-05-16 NOTE — Progress Notes (Signed)
 Virtual Visit Consent   [name] Taylor, you are scheduled for a virtual visit with a [facility] provider today. Just as with appointments in the office, your consent must be obtained to participate. Your consent will be active for this visit and any virtual visit you may have with one of our providers in the next 365 days. If you have a MyChart account, a copy of this consent can be sent to you electronically.  As this is a virtual visit, video technology does not allow for your provider to perform a traditional examination. This may limit your provider's ability to fully assess your condition. If your provider identifies any concerns that need to be evaluated in person or the need to arrange testing (such as labs, EKG, etc.), we will make arrangements to do so. Although advances in technology are sophisticated, we cannot ensure that it will always work on either your end or our end. If the connection with a video visit is poor, the visit may have to be switched to a telephone visit. With either a video or telephone visit, we are not always able to ensure that we have a secure connection.  By engaging in this virtual visit, you consent to the provision of healthcare and authorize for your insurance to be billed (if applicable) for the services provided during this visit. Depending on your insurance coverage, you may receive a charge related to this service.  I need to obtain your verbal consent now. Are you willing to proceed with your visit today? [name] Taylor has provided verbal consent on 05/16/2023 for a virtual visit (video or telephone). [name] Pion, NP  Date: 05/16/2023 9:08 AM   Virtual Visit via Video Note   I, [name] Taylor, connected with  [name] Taylor  ([id number], Jul 22, 1985) on 05/16/23 at  9:15 AM EDT by a video-enabled telemedicine application and verified that I am speaking with the correct person using two identifiers.  Location: Patient: Virtual Visit Location Patient:  Home Provider: Virtual Visit Location Provider: Home Office   I discussed the limitations of evaluation and management by telemedicine and the availability of in person appointments. The patient expressed understanding and agreed to proceed.    History of Present Illness: [name] Taylor is a 38 y.o. who identifies as a female who was assigned female at birth, and is being seen today for UTI symptoms.  HPI: Urinary Tract Infection  This is a new problem. The current episode started in the past 7 days. The problem occurs every urination. The problem has been unchanged. The pain is at a severity of 0/10. There has been no fever. She is Sexually active. There is No history of pyelonephritis. Associated symptoms include frequency, hesitancy and urgency. Pertinent negatives include no chills, discharge, flank pain, hematuria, nausea, possible pregnancy, sweats or vomiting. She has tried nothing for the symptoms.    Problems:  Patient Active Problem List   Diagnosis Date Noted   Irregular bleeding 04/25/2022   History of gestational diabetes 11/24/2021   History of preterm delivery 08/04/2021   Tobacco abuse 08/22/2017    Allergies: No Known Allergies Medications:  Current Outpatient Medications:    AUROVELA FE 1/20 1-20 MG-MCG tablet, TAKE 1 TABLET BY MOUTH DAILY, Disp: 28 tablet, Rfl: 3   etonogestrel  (NEXPLANON ) 68 MG IMPL implant, 1 each by Subdermal route once., Disp: , Rfl:    metroNIDAZOLE  (FLAGYL ) 500 MG tablet, Take 1 tablet (500 mg total) by mouth 2 (two) times daily.,  Disp: 14 tablet, Rfl: 0  Observations/Objective: Patient is well-developed, well-nourished in no acute distress.  Resting comfortably  at home.  Head is normocephalic, atraumatic.  No labored breathing.  Speech is clear and coherent with logical content.  Patient is alert and oriented at baseline.    Assessment and Plan:  1. Sensation as if bladder still full (Primary)  - cephALEXin  (KEFLEX ) 500 MG capsule;  Take 1 capsule (500 mg total) by mouth 2 (two) times daily for 7 days.  Dispense: 14 capsule; Refill: 0  -no other red flags for stone or kidney infection -increase fluids -complete medication as discussed -prevention discussed and on AVS -strict in person if symptoms worsen or fail to improve in 1-2 days   Reviewed side effects, risks and benefits of medication.    Patient acknowledged agreement and understanding of the plan.   Past Medical, Surgical, Social History, Allergies, and Medications have been Reviewed.   Follow Up Instructions: I discussed the assessment and treatment plan with the patient. The patient was provided an opportunity to ask questions and all were answered. The patient agreed with the plan and demonstrated an understanding of the instructions.  A copy of instructions were sent to the patient via MyChart unless otherwise noted below.    The patient was advised to call back or seek an in-person evaluation if the symptoms worsen or if the condition fails to improve as anticipated.    [name] Pion, NP

## 2023-06-27 ENCOUNTER — Encounter: Payer: Self-pay | Admitting: Emergency Medicine

## 2023-06-27 ENCOUNTER — Other Ambulatory Visit: Payer: Self-pay

## 2023-06-27 ENCOUNTER — Ambulatory Visit
Admission: EM | Admit: 2023-06-27 | Discharge: 2023-06-27 | Disposition: A | Discharge status: Home / Self Care | Attending: Family Medicine | Admitting: Family Medicine

## 2023-06-27 DIAGNOSIS — K0889 Other specified disorders of teeth and supporting structures: Secondary | ICD-10-CM | POA: Diagnosis not present

## 2023-06-27 MED ORDER — CHLORHEXIDINE GLUCONATE 0.12 % MT SOLN: 15.0000 mL | Freq: Two times a day (BID) | OROMUCOSAL | 0 refills | Status: DC

## 2023-06-27 MED ORDER — AMOXICILLIN-POT CLAVULANATE 875-125 MG PO TABS: 1.0000 | ORAL_TABLET | Freq: Two times a day (BID) | ORAL | 0 refills | Status: DC

## 2023-06-27 MED ORDER — LIDOCAINE VISCOUS HCL 2 % MT SOLN: 10.0000 mL | OROMUCOSAL | 0 refills | Status: DC | PRN
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
## 2023-06-27 NOTE — ED Triage Notes (Signed)
 Pt reports dental pain for last several days that is now progressed into bad taste in mouth and sore throat. Denies any known fever and reports has not been able to find dentist that accepts medicaid.

## 2023-08-30 ENCOUNTER — Ambulatory Visit

## 2023-08-30 ENCOUNTER — Other Ambulatory Visit: Payer: Self-pay | Admitting: Adult Health

## 2023-08-30 DIAGNOSIS — Z3201 Encounter for pregnancy test, result positive: Secondary | ICD-10-CM

## 2023-08-30 LAB — POCT URINE PREGNANCY: Preg Test, Ur: POSITIVE — AB

## 2023-08-30 MED ORDER — PRENATAL PLUS 27-1 MG PO TABS: 1.0000 | ORAL_TABLET | Freq: Every day | ORAL | 12 refills | Status: AC

## 2023-08-30 NOTE — Progress Notes (Signed)
 Rx PNV ?

## 2023-08-30 NOTE — Progress Notes (Signed)
   NURSE VISIT- PREGNANCY CONFIRMATION   SUBJECTIVE:  [name] Taylor is a 38 y.o. [phone] female at [redacted]w[redacted]d by certain LMP of Patient's last menstrual period was 07/18/2023 (exact date). Here for pregnancy confirmation.  Home pregnancy test: positive x 3  She reports no complaints.  She is not taking prenatal vitamins.    OBJECTIVE:  LMP 07/18/2023 (Exact Date)   Breastfeeding No   Appears well, in no apparent distress  Results for orders placed or performed in visit on 08/30/23 (from the past 24 hours)  POCT urine pregnancy   Collection Time: 08/30/23  1:52 PM  Result Value Ref Range   Preg Test, Ur Positive (A) Negative    ASSESSMENT: Positive pregnancy test, [redacted]w[redacted]d by LMP    PLAN: Schedule for dating ultrasound in 2 weeks Prenatal vitamins: note routed to Delon Lewis to send prescription   Nausea medicines: not currently needed   OB packet given: Yes  Alan LITTIE Fischer  08/30/2023 1:55 PM

## 2023-09-04 ENCOUNTER — Other Ambulatory Visit: Payer: Self-pay

## 2023-09-04 ENCOUNTER — Emergency Department (HOSPITAL_COMMUNITY): Admission: EM | Admit: 2023-09-04 | Discharge: 2023-09-04 | Disposition: A | Discharge status: Home / Self Care

## 2023-09-04 ENCOUNTER — Emergency Department (HOSPITAL_COMMUNITY)

## 2023-09-04 ENCOUNTER — Encounter (HOSPITAL_COMMUNITY): Payer: Self-pay | Admitting: Emergency Medicine

## 2023-09-04 DIAGNOSIS — O26851 Spotting complicating pregnancy, first trimester: Secondary | ICD-10-CM | POA: Insufficient documentation

## 2023-09-04 DIAGNOSIS — D72829 Elevated white blood cell count, unspecified: Secondary | ICD-10-CM | POA: Insufficient documentation

## 2023-09-04 DIAGNOSIS — O209 Hemorrhage in early pregnancy, unspecified: Secondary | ICD-10-CM | POA: Diagnosis not present

## 2023-09-04 DIAGNOSIS — N939 Abnormal uterine and vaginal bleeding, unspecified: Secondary | ICD-10-CM | POA: Insufficient documentation

## 2023-09-04 DIAGNOSIS — Z3A01 Less than 8 weeks gestation of pregnancy: Secondary | ICD-10-CM | POA: Diagnosis not present

## 2023-09-04 DIAGNOSIS — O4690 Antepartum hemorrhage, unspecified, unspecified trimester: Secondary | ICD-10-CM

## 2023-09-04 LAB — CBC WITH DIFFERENTIAL/PLATELET
Abs Immature Granulocytes: 0.05 K/uL (ref 0.00–0.07)
Basophils Absolute: 0.1 K/uL (ref 0.0–0.1)
Basophils Relative: 1 %
Eosinophils Absolute: 0.2 K/uL (ref 0.0–0.5)
Eosinophils Relative: 2 %
HCT: 40.5 % (ref 36.0–46.0)
Hemoglobin: 13.8 g/dL (ref 12.0–15.0)
Immature Granulocytes: 0 %
Lymphocytes Relative: 32 %
Lymphs Abs: 3.7 K/uL (ref 0.7–4.0)
MCH: 32.7 pg (ref 26.0–34.0)
MCHC: 34.1 g/dL (ref 30.0–36.0)
MCV: 96 fL (ref 80.0–100.0)
Monocytes Absolute: 0.6 K/uL (ref 0.1–1.0)
Monocytes Relative: 5 %
Neutro Abs: 7 K/uL (ref 1.7–7.7)
Neutrophils Relative %: 60 %
Platelets: 314 K/uL (ref 150–400)
RBC: 4.22 MIL/uL (ref 3.87–5.11)
RDW: 12.1 % (ref 11.5–15.5)
WBC: 11.6 K/uL — ABNORMAL HIGH (ref 4.0–10.5)
nRBC: 0 % (ref 0.0–0.2)

## 2023-09-04 LAB — COMPREHENSIVE METABOLIC PANEL WITH GFR
ALT: 28 U/L (ref 0–44)
AST: 19 U/L (ref 15–41)
Albumin: 4.2 g/dL (ref 3.5–5.0)
Alkaline Phosphatase: 52 U/L (ref 38–126)
Anion gap: 12 (ref 5–15)
BUN: 11 mg/dL (ref 6–20)
CO2: 23 mmol/L (ref 22–32)
Calcium: 9.3 mg/dL (ref 8.9–10.3)
Chloride: 104 mmol/L (ref 98–111)
Creatinine, Ser: 0.85 mg/dL (ref 0.44–1.00)
GFR, Estimated: >60 mL/min (ref >60)
Glucose, Bld: 93 mg/dL (ref 70–99)
Potassium: 3.8 mmol/L (ref 3.5–5.1)
Sodium: 139 mmol/L (ref 135–145)
Total Bilirubin: 0.6 mg/dL (ref 0.0–1.2)
Total Protein: 7.3 g/dL (ref 6.5–8.1)

## 2023-09-04 LAB — HCG, QUANTITATIVE, PREGNANCY: hCG, Beta Chain, Quant, S: 67 m[IU]/mL — ABNORMAL HIGH (ref <5)

## 2023-09-04 MED ORDER — METOCLOPRAMIDE HCL 10 MG PO TABS: 10.0000 mg | ORAL_TABLET | Freq: Four times a day (QID) | ORAL | 0 refills | Status: AC

## 2023-09-04 MED ORDER — ACETAMINOPHEN 325 MG PO TABS
650.0000 mg | ORAL_TABLET | Freq: Once | ORAL | Status: AC
  Administered 2023-09-04: 650 mg via ORAL
  Filled 2023-09-04: qty 2

## 2023-09-04 NOTE — ED Triage Notes (Signed)
 Pt to the ED with complaints of vaginal bleeding and 6 wk pregnancy.  Bleeding began at 1400 today.

## 2023-09-04 NOTE — ED Provider Notes (Signed)
 [place] EMERGENCY DEPARTMENT AT [facility] Provider Note   CSN: [id number] Arrival date & time: 09/04/23  1607     Patient presents with: VAGINAL BLEEDING DURING PREGNANCY   [name] Taylor is a 38 y.o. female.  HPI Patient is a 38 year old female presenting the ED today for concerns for vaginal bleeding noting that she is approximately 6 weeks and 6 days pregnant with her last period noted to be on 07/18/2023.  Noted that the bleeding started approximately 3 PM today and had noticed both blood and small clots.  G6, P4.  With 1 premature delivery.  1 miscarriage.  Denies fever, vision changes, chest pain, shortness of breath, abdominal pain, nausea, vomiting, diarrhea, dysuria, lower leg swelling.    Prior to Admission medications   Medication Sig Start Date End Date Taking? Authorizing Provider  metoCLOPramide  (REGLAN ) 10 MG tablet Take 1 tablet (10 mg total) by mouth every 6 (six) hours. 09/04/23  Yes Brittnye Josephs S, PA-C  prenatal vitamin w/FE, FA (PRENATAL 1 + 1) 27-1 MG TABS tablet Take 1 tablet by mouth daily at 12 noon. 08/30/23   Signa Delon LABOR, NP    Allergies: Patient has no known allergies.    Review of Systems  Genitourinary:  Positive for vaginal bleeding.  All other systems reviewed and are negative.   Updated Vital Signs BP 125/78   Pulse 71   Temp 98 F (36.7 C) (Oral)   Resp 17   Ht 5' 1 (1.549 m)   Wt 68 kg   LMP 07/18/2023 (Exact Date)   SpO2 99%   BMI 28.34 kg/m   Physical Exam Vitals and nursing note reviewed.  Constitutional:      General: She is not in acute distress.    Appearance: Normal appearance. She is not ill-appearing or diaphoretic.  HENT:     Head: Normocephalic and atraumatic.  Eyes:     General: No scleral icterus.       Right eye: No discharge.        Left eye: No discharge.     Extraocular Movements: Extraocular movements intact.     Conjunctiva/sclera: Conjunctivae normal.  Cardiovascular:     Rate and  Rhythm: Normal rate and regular rhythm.     Pulses: Normal pulses.     Heart sounds: Normal heart sounds. No murmur heard.    No friction rub. No gallop.  Pulmonary:     Effort: Pulmonary effort is normal. No respiratory distress.     Breath sounds: No stridor. No wheezing, rhonchi or rales.  Chest:     Chest wall: No tenderness.  Abdominal:     General: Abdomen is flat. There is no distension.     Palpations: Abdomen is soft.     Tenderness: There is no abdominal tenderness. There is no right CVA tenderness, left CVA tenderness, guarding or rebound.  Musculoskeletal:        General: No swelling, deformity or signs of injury.     Cervical back: Normal range of motion. No rigidity.     Right lower leg: No edema.     Left lower leg: No edema.  Skin:    General: Skin is warm and dry.     Findings: No bruising, erythema or lesion.  Neurological:     General: No focal deficit present.     Mental Status: She is alert and oriented to person, place, and time. Mental status is at baseline.     Sensory: No sensory  deficit.     Motor: No weakness.  Psychiatric:        Mood and Affect: Mood normal.     (all labs ordered are listed, but only abnormal results are displayed) Labs Reviewed  HCG, QUANTITATIVE, PREGNANCY - Abnormal; Notable for the following components:      Result Value   hCG, Beta Chain, Quant, S 67 (*)    All other components within normal limits  CBC WITH DIFFERENTIAL/PLATELET - Abnormal; Notable for the following components:   WBC 11.6 (*)    All other components within normal limits  COMPREHENSIVE METABOLIC PANEL WITH GFR    EKG: None  Radiology: US  OB LESS THAN 14 WEEKS WITH OB TRANSVAGINAL Result Date: 09/04/2023 CLINICAL DATA:  Vaginal bleeding in early pregnancy. Beta HCG of 67. EXAM: OBSTETRIC <14 WK US  AND TRANSVAGINAL OB US  TECHNIQUE: Both transabdominal and transvaginal ultrasound examinations were performed for complete evaluation of the gestation as well  as the maternal uterus, adnexal regions, and pelvic cul-de-sac. Transvaginal technique was performed to assess early pregnancy. COMPARISON:  None available this pregnancy. FINDINGS: Intrauterine gestational sac: None Yolk sac:  Not Visualized. Embryo:  Not Visualized. Maternal uterus/adnexae: The uterus is anteverted. No fluid or gestational sac in the endometrial canal. The endometrium measures 8 mm. Both ovaries are visualized and are normal. The right ovary is only seen transabdominally. Ovarian blood flow is seen. No adnexal mass. No pelvic free fluid. IMPRESSION: No intrauterine pregnancy or findings suspicious for ectopic pregnancy. Findings are consistent with pregnancy of unknown location and may reflect early intrauterine pregnancy not yet visualized sonographically, occult ectopic pregnancy, or failed pregnancy. Recommend trending of beta HCG, sonographic follow-up in 7-10 days as clinically indicated. Electronically Signed   By: Andrea Gasman M.D.   On: 09/04/2023 19:39    Procedures   Medications Ordered in the ED  acetaminophen  (TYLENOL ) tablet 650 mg (650 mg Oral Given 09/04/23 1842)                                Medical Decision Making Amount and/or Complexity of Data Reviewed Labs: ordered. Radiology: ordered.  Risk OTC drugs. Prescription drug management.   This patient is a 38 year old female who presents to the ED for concern of vaginal bleeding noted to be approximately 6 weeks and 6 days pregnant.  Not having other symptoms at this time, noting that she did see some small clots passed.  On physical exam, patient is in no acute distress, afebrile, alert and orient x 4, speaking in full sentences, nontachypneic, nontachycardic.  No abdominal tenderness to palpation, LCTAB, RRR, no murmur, no lower leg edema.  No CVA tenderness.  Unremarkable exam otherwise.  With vaginal bleeding in early pregnancy and new previous ultrasound, which to rule out ectopic with ultrasound.   Lab work  was notable for a mildly elevated white count 11.6, with her noting to have previously highly elevated white count.  And imaging revealed no intrauterine or ectopic pregnancy.  Will have her continue follow-up with OB/GYN for further imaging and monitoring.  Provide to return to ER precautions.  Provided some Reglan  for her.  Patient vital signs have remained stable throughout the course of patient's time in the ED. Low suspicion for any other emergent pathology at this time. I believe this patient is safe to be discharged. Provided strict return to ER precautions. Patient expressed agreement and understanding of plan. All questions were answered.  Differential diagnoses prior to evaluation: The emergent differential diagnosis includes, but is not limited to,  Abnormal uterine bleeding, vaginal/cervical trauma, STD/PID, subchorionic hemorrhage/hematoma, threatened miscarriage, incomplete miscarriage, normal bleeding in early trimester pregnancy, ectopic pregnancy. This is not an exhaustive differential.   Past Medical History / Co-morbidities / Social History: Currently pain, marijuana use, GERD  Additional history: Chart reviewed. Pertinent results include:   Noted to have a positive pregnancy test on 08/30/2023.  Lab Tests/Imaging studies: I personally interpreted labs/imaging and the pertinent results include: CBC notes an elevated white count of 11.6 CMP unremarkable hCG quantitative 67. Ultrasound reveals no intrauterine pregnancy or suspicious findings for ectopic pregnancy.  I agree with the radiologist interpretation.  Medications: I ordered medication including Tylenol .  I have reviewed the patients home medicines and have made adjustments as needed.  Critical Interventions: None  Social Determinants of Health: Has good follow-up with OB/GYN  Disposition: After consideration of the diagnostic results and the patients response to treatment, I feel that the patient  would benefit from discharge and treatment as above.   emergency department workup does not suggest an emergent condition requiring admission or immediate intervention beyond what has been performed at this time. The plan is: Follow-up with OB/GYN, return for new or worsening symptoms, Reglan  and Tylenol . The patient is safe for discharge and has been instructed to return immediately for worsening symptoms, change in symptoms or any other concerns.   Final diagnoses:  Vaginal bleeding in pregnancy    ED Discharge Orders          Ordered    metoCLOPramide  (REGLAN ) 10 MG tablet  Every 6 hours        09/04/23 2024               Harish Bram S, PA-C 09/04/23 2149    Simon Lavonia SAILOR, MD 09/04/23 2253

## 2023-09-04 NOTE — Discharge Instructions (Addendum)
 You are seen today for vaginal bleeding in pregnancy.  Your ultrasound today was reassuring and we have low suspicions for any emergent causes at this time of your symptoms.  Recommend you continue to follow-up with your OB/GYN within the next week for repeat of ultrasound and evaluation.  Return to the ED sooner though if you intervene new or worsening symptoms which include worsening shortness of breath, uncontrollable headache, persistent vomiting, vision changes, leg swelling.  I prescribed you Reglan  for you to use for both headache and nausea.  You also additionally use Tylenol .  Take Tylenol  (acetominophen)  650mg  every 4-6 hours, as needed for pain or fever. Do not take more than 4,000 mg in a 24-hour period. As this may cause liver damage. While this is rare, if you begin to develop yellowing of the skin or eyes, stop taking and return to ER immediately.

## 2023-09-04 NOTE — ED Notes (Signed)
 Patient transported to Ultrasound

## 2023-09-05 ENCOUNTER — Other Ambulatory Visit: Payer: Self-pay

## 2023-09-05 ENCOUNTER — Ambulatory Visit: Admitting: Women's Health

## 2023-09-05 DIAGNOSIS — O200 Threatened abortion: Secondary | ICD-10-CM

## 2023-09-05 NOTE — Progress Notes (Signed)
 Patient seen in ED for bleeding and passing clots. RN looked over MD notes, ultrasounds and suggestions. Nothing seen on ultrasound which can be consistent with a miscarriage. RN placed Hcg lab in for patient tomorrow to evaluate trend. Follow-up as labs result.

## 2023-09-06 ENCOUNTER — Encounter: Payer: Self-pay | Admitting: Women's Health

## 2023-09-06 ENCOUNTER — Other Ambulatory Visit

## 2023-09-07 ENCOUNTER — Ambulatory Visit: Payer: Self-pay | Admitting: Women's Health

## 2023-09-07 DIAGNOSIS — O039 Complete or unspecified spontaneous abortion without complication: Secondary | ICD-10-CM

## 2023-09-07 LAB — BETA HCG QUANT (REF LAB): hCG Quant: 29 m[IU]/mL

## 2023-09-11 ENCOUNTER — Encounter: Payer: Self-pay | Admitting: Women's Health

## 2023-09-11 ENCOUNTER — Ambulatory Visit: Admitting: Women's Health

## 2023-09-11 VITALS — BP 105/68 | HR 90 | Ht 61.0 in | Wt 155.0 lb

## 2023-09-11 DIAGNOSIS — O039 Complete or unspecified spontaneous abortion without complication: Secondary | ICD-10-CM | POA: Diagnosis not present

## 2023-09-11 DIAGNOSIS — Z30011 Encounter for initial prescription of contraceptive pills: Secondary | ICD-10-CM | POA: Diagnosis not present

## 2023-09-11 DIAGNOSIS — Z3A00 Weeks of gestation of pregnancy not specified: Secondary | ICD-10-CM

## 2023-09-11 LAB — POCT URINE PREGNANCY: Preg Test, Ur: NEGATIVE

## 2023-09-11 MED ORDER — SLYND 4 MG PO TABS: 1.0000 | ORAL_TABLET | Freq: Every day | ORAL | 3 refills | Status: AC

## 2023-09-11 NOTE — Progress Notes (Signed)
   GYN VISIT Patient name: [name] Taylor MRN [id number]  Date of birth: November 03, 1985 Chief Complaint:   Follow-up  History of Present Illness:   [name] Taylor is a 38 y.o. [id number] Caucasian female being seen today for f/u after SAB and wants to start birth control (wants pills). Never wants another pregnancy but wants to think some more about getting tubes removed before she commits to it. Does smoke, no h/o HTN, DVT/PE, CVA, MI, or migraines w/ aura. . Patient's last menstrual period was 07/18/2023 (exact date). Last pap 08/04/21. Results were: NILM w/ HRHPV negative     08/04/2021    2:29 PM 02/10/2020    2:08 PM 11/09/2017    8:53 AM  Depression screen PHQ 2/9  Decreased Interest 3 0 0  Down, Depressed, Hopeless 0 0 0  PHQ - 2 Score 3 0 0  Altered sleeping 2    Tired, decreased energy 3    Change in appetite 2    Feeling bad or failure about yourself  0    Trouble concentrating 0    Moving slowly or fidgety/restless 0    Suicidal thoughts 0    PHQ-9 Score 10          08/04/2021    2:29 PM  GAD 7 : Generalized Anxiety Score  Nervous, Anxious, on Edge 0  Control/stop worrying 0  Worry too much - different things 0  Trouble relaxing 0  Restless 0  Easily annoyed or irritable 0  Afraid - awful might happen 0  Total GAD 7 Score 0     Review of Systems:   Pertinent items are noted in HPI Denies fever/chills, dizziness, headaches, visual disturbances, fatigue, shortness of breath, chest pain, abdominal pain, vomiting, abnormal vaginal discharge/itching/odor/irritation, problems with periods, bowel movements, urination, or intercourse unless otherwise stated above.  Pertinent History Reviewed:  Reviewed past medical,surgical, social, obstetrical and family history.  Reviewed problem list, medications and allergies. Physical Assessment:   Vitals:   09/11/23 1609  BP: 105/68  Pulse: 90  Weight: 155 lb (70.3 kg)  Height: 5' 1 (1.549 m)  Body mass index is 29.29 kg/m.        Physical Examination:   General appearance: alert, well appearing, and in no distress  Mental status: alert, oriented to person, place, and time  Skin: warm & dry   Cardiovascular: normal heart rate noted  Respiratory: normal respiratory effort, no distress  Abdomen: soft, non-tender   Pelvic: examination not indicated  Extremities: no edema   Chaperone: N/A  Results for orders placed or performed in visit on 09/11/23 (from the past 24 hours)  POCT urine pregnancy   Collection Time: 09/11/23  4:13 PM  Result Value Ref Range   Preg Test, Ur Negative Negative    Assessment & Plan:  1) S/P SAB> neg UPT today  2) Contraception management> smokes and >35yo, discussed options, wants POPs, rx Slynd  to [facility], gave 1 sample pack, condoms x 2wks. If decides for BTS make appt w/ MD  Meds:  Meds ordered this encounter  Medications   Drospirenone  (SLYND ) 4 MG TABS    Sig: Take 1 tablet (4 mg total) by mouth daily.    Dispense:  90 tablet    Refill:  3    Orders Placed This Encounter  Procedures   POCT urine pregnancy    Return for Pap & physical.  [name] Taylor CNM, [facility] 09/11/2023 4:25 PM

## 2023-09-18 ENCOUNTER — Other Ambulatory Visit
